# Patient Record
Sex: Female | Born: 1965 | Race: White | Hispanic: No | Smoking: Current every day smoker
Health system: Southern US, Community
[De-identification: ages and names within clinical notes are randomized; demographics above are authoritative.]

## PROBLEM LIST (undated history)

## (undated) DIAGNOSIS — D649 Anemia, unspecified: Secondary | ICD-10-CM

## (undated) DIAGNOSIS — G40909 Epilepsy, unspecified, not intractable, without status epilepticus: Secondary | ICD-10-CM

## (undated) DIAGNOSIS — Z789 Other specified health status: Secondary | ICD-10-CM

## (undated) DIAGNOSIS — M47819 Spondylosis without myelopathy or radiculopathy, site unspecified: Secondary | ICD-10-CM

## (undated) DIAGNOSIS — K8050 Calculus of bile duct without cholangitis or cholecystitis without obstruction: Secondary | ICD-10-CM

## (undated) DIAGNOSIS — E785 Hyperlipidemia, unspecified: Secondary | ICD-10-CM

## (undated) DIAGNOSIS — T7840XA Allergy, unspecified, initial encounter: Secondary | ICD-10-CM

## (undated) DIAGNOSIS — M1990 Unspecified osteoarthritis, unspecified site: Secondary | ICD-10-CM

## (undated) DIAGNOSIS — R002 Palpitations: Secondary | ICD-10-CM

## (undated) DIAGNOSIS — M5441 Lumbago with sciatica, right side: Secondary | ICD-10-CM

## (undated) DIAGNOSIS — R011 Cardiac murmur, unspecified: Secondary | ICD-10-CM

## (undated) DIAGNOSIS — F329 Major depressive disorder, single episode, unspecified: Secondary | ICD-10-CM

## (undated) DIAGNOSIS — F32A Depression, unspecified: Secondary | ICD-10-CM

## (undated) DIAGNOSIS — Z87442 Personal history of urinary calculi: Secondary | ICD-10-CM

## (undated) DIAGNOSIS — G4700 Insomnia, unspecified: Secondary | ICD-10-CM

## (undated) DIAGNOSIS — Z720 Tobacco use: Secondary | ICD-10-CM

## (undated) DIAGNOSIS — I2510 Atherosclerotic heart disease of native coronary artery without angina pectoris: Secondary | ICD-10-CM

## (undated) DIAGNOSIS — F419 Anxiety disorder, unspecified: Secondary | ICD-10-CM

## (undated) DIAGNOSIS — I471 Supraventricular tachycardia, unspecified: Secondary | ICD-10-CM

## (undated) DIAGNOSIS — Z8489 Family history of other specified conditions: Secondary | ICD-10-CM

## (undated) DIAGNOSIS — Z87891 Personal history of nicotine dependence: Secondary | ICD-10-CM

## (undated) HISTORY — PX: FOOT SURGERY: SHX648

## (undated) HISTORY — DX: Hyperlipidemia, unspecified: E78.5

## (undated) HISTORY — DX: Anxiety disorder, unspecified: F41.9

## (undated) HISTORY — PX: DILATION AND CURETTAGE OF UTERUS: SHX78

## (undated) HISTORY — PX: CATARACT EXTRACTION, BILATERAL: SHX1313

## (undated) HISTORY — PX: HAND SURGERY: SHX662

## (undated) HISTORY — PX: OTHER SURGICAL HISTORY: SHX169

## (undated) HISTORY — DX: Anemia, unspecified: D64.9

## (undated) HISTORY — DX: Lumbago with sciatica, right side: M54.41

## (undated) HISTORY — DX: Atherosclerotic heart disease of native coronary artery without angina pectoris: I25.10

## (undated) HISTORY — DX: Tobacco use: Z72.0

## (undated) HISTORY — PX: SPINAL FUSION: SHX223

## (undated) HISTORY — DX: Allergy, unspecified, initial encounter: T78.40XA

## (undated) HISTORY — PX: TONSILLECTOMY: SUR1361

## (undated) HISTORY — PX: CYST EXCISION: SHX5701

## (undated) HISTORY — PX: LUMBAR FUSION: SHX111

## (undated) HISTORY — DX: Major depressive disorder, single episode, unspecified: F32.9

## (undated) HISTORY — DX: Depression, unspecified: F32.A

## (undated) HISTORY — DX: Unspecified osteoarthritis, unspecified site: M19.90

---

## 2004-03-04 ENCOUNTER — Ambulatory Visit (HOSPITAL_COMMUNITY): Admission: RE | Admit: 2004-03-04 | Discharge: 2004-03-04 | Payer: Self-pay | Admitting: Obstetrics & Gynecology

## 2005-07-06 ENCOUNTER — Ambulatory Visit (HOSPITAL_COMMUNITY): Admission: RE | Admit: 2005-07-06 | Discharge: 2005-07-06 | Payer: Self-pay | Admitting: Podiatry

## 2005-11-15 ENCOUNTER — Ambulatory Visit (HOSPITAL_COMMUNITY): Admission: RE | Admit: 2005-11-15 | Discharge: 2005-11-15 | Payer: Self-pay | Admitting: General Surgery

## 2005-11-29 ENCOUNTER — Ambulatory Visit: Payer: Self-pay | Admitting: Orthopedic Surgery

## 2005-12-14 ENCOUNTER — Ambulatory Visit (HOSPITAL_COMMUNITY): Admission: RE | Admit: 2005-12-14 | Discharge: 2005-12-14 | Payer: Self-pay | Admitting: General Surgery

## 2005-12-16 ENCOUNTER — Ambulatory Visit (HOSPITAL_COMMUNITY): Admission: RE | Admit: 2005-12-16 | Discharge: 2005-12-16 | Payer: Self-pay | Admitting: Orthopedic Surgery

## 2005-12-26 ENCOUNTER — Ambulatory Visit: Payer: Self-pay | Admitting: Orthopedic Surgery

## 2005-12-30 ENCOUNTER — Ambulatory Visit: Payer: Self-pay | Admitting: Orthopedic Surgery

## 2005-12-30 ENCOUNTER — Encounter (INDEPENDENT_AMBULATORY_CARE_PROVIDER_SITE_OTHER): Payer: Self-pay | Admitting: Specialist

## 2005-12-30 ENCOUNTER — Ambulatory Visit (HOSPITAL_COMMUNITY): Admission: RE | Admit: 2005-12-30 | Discharge: 2005-12-30 | Payer: Self-pay | Admitting: Orthopedic Surgery

## 2006-01-02 ENCOUNTER — Ambulatory Visit: Payer: Self-pay | Admitting: Orthopedic Surgery

## 2006-01-11 ENCOUNTER — Ambulatory Visit: Payer: Self-pay | Admitting: Orthopedic Surgery

## 2006-08-28 ENCOUNTER — Ambulatory Visit (HOSPITAL_COMMUNITY): Admission: RE | Admit: 2006-08-28 | Discharge: 2006-08-28 | Payer: Self-pay | Admitting: Family Medicine

## 2007-02-21 ENCOUNTER — Ambulatory Visit (HOSPITAL_COMMUNITY): Admission: RE | Admit: 2007-02-21 | Discharge: 2007-02-21 | Payer: Self-pay | Admitting: Urology

## 2007-06-18 ENCOUNTER — Ambulatory Visit (HOSPITAL_COMMUNITY): Admission: RE | Admit: 2007-06-18 | Discharge: 2007-06-18 | Payer: Self-pay | Admitting: Family Medicine

## 2007-09-20 ENCOUNTER — Ambulatory Visit (HOSPITAL_COMMUNITY): Admission: RE | Admit: 2007-09-20 | Discharge: 2007-09-20 | Payer: Self-pay | Admitting: Family Medicine

## 2007-09-25 ENCOUNTER — Encounter (HOSPITAL_COMMUNITY): Admission: RE | Admit: 2007-09-25 | Discharge: 2007-10-25 | Payer: Self-pay | Admitting: Family Medicine

## 2007-10-04 ENCOUNTER — Ambulatory Visit: Payer: Self-pay | Admitting: Gastroenterology

## 2008-10-22 ENCOUNTER — Ambulatory Visit (HOSPITAL_COMMUNITY): Admission: RE | Admit: 2008-10-22 | Discharge: 2008-10-22 | Payer: Self-pay | Admitting: Family Medicine

## 2009-01-15 ENCOUNTER — Other Ambulatory Visit: Admission: RE | Admit: 2009-01-15 | Discharge: 2009-01-15 | Payer: Self-pay | Admitting: Obstetrics & Gynecology

## 2010-01-26 ENCOUNTER — Encounter: Admission: RE | Admit: 2010-01-26 | Discharge: 2010-01-26 | Payer: Self-pay | Admitting: Internal Medicine

## 2010-03-29 ENCOUNTER — Other Ambulatory Visit: Admission: RE | Admit: 2010-03-29 | Discharge: 2010-03-29 | Payer: Self-pay | Admitting: Obstetrics & Gynecology

## 2010-05-29 ENCOUNTER — Encounter: Payer: Self-pay | Admitting: General Surgery

## 2010-09-21 NOTE — Assessment & Plan Note (Signed)
NAMEMarland [name]  RANESSA, KOSTA                CHART#:  [id number]   DATE:  10/04/2007                       DOB:  February 03, 1966   REFERRING PHYSICIAN:  Corrie Mckusick, M.D.   REASON FOR CONSULTATION:  Anemia, colonoscopy.   HISTORY OF PRESENT ILLNESS:  Ms. Anna [name] is a 45 year old female who  stated her coworkers noticed she was yellow in her face, her hands, and  her ankles.  She was in [place] talking to her mother and found that  she was severely anemic.  She was concerned about her health and went to  see Dr. Phillips Odor, who ordered labs.  He found that she had a hemoglobin  of 11.9 with an MCV of 90.  She rarely has diarrhea.  She occasionally  has constipation.  She denies any rectal bleeding, black stool, problems  swallowing, or problems with sedation.  She has heartburn and  indigestion three times a month.  She has not been taking her fish oil  for the last month.  For her heartburn and indigestion, she takes  Rolaids.  No particular foods precipitate her heartburn and indigestion,  sometimes it can be water.  She had no change in her bowel habits or  abdominal pain.  Years ago, she had some craving of ice.   PAST MEDICAL HISTORY:  1. Allergies.  2. She has had no history of diverticulitis.   PAST SURGICAL HISTORY:  1. Right hand surgery.  2. Left foot bunionectomy.  3. Tubal ligation.  4. Uterine ablation, resulting in amenorrhea.   ALLERGIES:  Penicillin.   MEDICATIONS:  1. Advil as needed.  2. Aleve as needed.  3. Iron   FAMILY HISTORY:  Her mother has a tortuous colon and cannot have  colonoscopy.  She currently has anemia.  Her father recently died of T  cell non-Hodgkin's lymphoma.  She has no known family history of colon  cancer, colon polyps, or celiac sprue.   SOCIAL HISTORY:  She has two children, 16 and 39, and she is married.  She works at [employer].  She quit smoking in September, 2007.  She denies any IV or recreational drug use.  She has had no  blood  transfusions.   PHYSICAL EXAMINATION:  Weight 158 pounds.  Height 5 foot 5 inches.  BMI  28 (overweight).  Temperature 98.1, blood pressure 116/80, pulse 88.  GENERAL:  She is in no apparent distress.  Alert and oriented x4.  HEENT:  Normocephalic and atraumatic.  Pupils are equal, round and  reactive to light.  Mouth:  No oral lesions.  Posterior pharynx without  erythema or exudate.  NECK:  Full range of motion.  No lymphadenopathy.  LUNGS:  Clear to auscultation bilaterally.  CARDIOVASCULAR:  Regular  rhythm.  No murmur.  Normal S1 and S2.  ABDOMEN:  Bowel sounds are  present, soft, nontender, nondistended.  No rebound or guarding.  EXTREMITIES:  No cyanosis or edema. NEURO:  She has no focal neurologic  deficits. RECTAL:  Heme negative.  No masses palpated.  Normal tone.   LABS FROM MAY, 2009:  Hemoglobin 11.1, MCV 90, platelets 286.  BUN 16,  creatinine 0.67, AST 13, ALT 14, albumin 4.8, TSH 1.673.  TIBC 314 (250-  470).  B12 317, folate 16.9, ferritin 69 (10-291).   ASSESSMENT:  Ms. Anna [name]  is a 45 year old female with a very mild  normocytic anemia.  It is conceivable that she could have early iron-  deficiency anemia.  Her parameters are not consistent with FeDA  currently while on iron therapy.  Thank you for allowing me to see Ms.  Gauldin in consultation.  My recommendations follow.   RECOMMENDATIONS:  1. I offered Ms. Gauldin a colonoscopy, but she said due to insurance      reasons, she would like to hold off.  2. Will reschedule her in three months and check a hemoglobin and      hematocrit.  I have asked her not to use iron supplementation, so      that if she truly does manifest evidence of iron-deficiency anemia,      then I would strongly encourage her to have the colonoscopy at that      time.  Currently, she has no evidence of active GI bleeding, heme-      positive stools, or other concerning symptoms.       Kassie Mends, M.D.  Electronically  Signed     SM/MEDQ  D:  10/04/2007  T:  10/04/2007  Job:  [id number]   cc:   Corrie Mckusick, M.D.

## 2010-09-24 NOTE — Op Note (Signed)
NAME:  Anna [name], Anna [name]               ACCOUNT NO.:  [id number]   MEDICAL RECORD NO.:  [id number]          PATIENT TYPE:  AMB   LOCATION:  DAY                           FACILITY:  [facility]   PHYSICIAN:  Vickki Hearing, M.D.DATE OF BIRTH:  14-Dec-1965   DATE OF PROCEDURE:  12/30/2005  DATE OF DISCHARGE:                                 OPERATIVE REPORT   PREOPERATIVE DIAGNOSIS:  Mass right hand.   POSTOPERATIVE DIAGNOSIS:  Mass right hand/ganglion cyst of tendon sheath  right index finger.   PROCEDURE:  Excision of mass right hand.   SURGEON:  Dr. Fuller Canada.   ASSISTANT:  No assistance.   ANESTHETIC:  Bier block.   SPECIMENS:  One ganglion cyst.   DISPOSITION OF PATH:  Specimen to pathology for microscopic identification.   ESTIMATED BLOOD LOSS:  Minimal.   COMPLICATIONS:  None.  The patient went to recovery in stable condition.   DESCRIPTION OF PROCEDURE:  The primary indication of the procedure was pain.  The patient identified in the preop holding area.  She marked the right hand  as the operative site, I circled the mass and marked my initials there. She  was given a preoperative dose of Ancef.  Her history and physical were  updated and she was taken to the operating room for a Bier block.  After  that was successfully established without any problem, the right upper  extremity was prepped and draped using a DuraPrep solution. That was allowed  to dry and a time-out procedure was completed. The procedure confirmed as  excision of mass right index finger on Anna [name].   An oblique incision was made across the mass, subcutaneous tissue was  divided, mass was immediately encountered as an encapsulated structure.  Blunt dissection was carried down to the tendon sheath. The digital nerve  was protected after identification. The mass was removed.  It came directly  from the tendon sheath and was removed as one intact specimen and sent to  pathology. The wound was  irrigated and closed with 2-0 Monocryl and 3-0  nylon. I injected 10 mL of plain 0.25% Marcaine and applied a sterile  dressing. The tourniquet was eventually released.  Fingers were in good condition with good capillary refill.  The patient was  taken to PACU as stated in good condition.  Follow-up will be Monday.  She  is discharged on Vicodin #40 with one refill, take one 5 mg tablet q.4h.  p.r.n. for pain. Instructions are to keep the hand elevated, keep the  dressing clean and dry.      Vickki Hearing, M.D.  Electronically Signed     SEH/MEDQ  D:  12/30/2005  T:  12/30/2005  Job:  [id number]

## 2010-09-24 NOTE — Op Note (Signed)
Anna [name], Anna [name]               ACCOUNT NO.:  [id number]   MEDICAL RECORD NO.:  [id number]          PATIENT TYPE:  AMB   LOCATION:  DAY                           FACILITY:  [facility]   PHYSICIAN:  Lazaro Arms, M.D.   DATE OF BIRTH:  [birth year]   DATE OF PROCEDURE:  03/04/2004  DATE OF DISCHARGE:                                 OPERATIVE REPORT   PREOPERATIVE DIAGNOSES:  1.  Menometrorrhagia.  2.  Dysmenorrhea.  3.  Desires sterilization.   POSTOPERATIVE DIAGNOSES:  1.  Menometrorrhagia.  2.  Dysmenorrhea.  3.  Desires sterilization.   PROCEDURE:  1.  Laparoscopic bilateral tubal fulguration.  2.  Hysteroscopy, dilatation and curettage, and endometrial ablation.   SURGEON:  Lazaro Arms, M.D.   FINDINGS:  The patient had a normal endometrial cavity.  She had a couple of  small, what appeared to be polyps. They appeared to be benign.  Her  peritoneal cavity was also normal.  Uterus, tubes, and ovaries were normal.   DESCRIPTION OF OPERATION:  The patient was taken to the operating room and  placed in the supine position where she underwent general endotracheal  anesthesia.  She was prepped and draped in the usual sterile fashion for  both abdominal and vaginal surgery.  An incision was made in the umbilicus  and a Veress needle placed in the peritoneal cavity without difficulty.  A  nonbladed trocar was used and with direct visualization the peritoneal  cavity was entered without difficulty.  The above-noted findings were seen.   The peritoneal cavity continued to be insufflated during the procedure.  A  bilateral tubal ligation was performed using the electrocautery unit.  A 2.5  cm segment was burned, no resistance beyond the distal isthmic ampullary  region of each tube.  The instruments were removed.  The gas was allowed to  escape.  The fascia was closed.  The skin was closed with 3-0 Vicryl  subcutaneously and also with Dermabond.   Attention was then to the  hysteroscopy, D&C, endometrial ablation.  The  speculum was placed, cervix was grasped.  The cervix dilated to 25 [place]  and the hydrothermal ablator hysteroscope was placed.  The endometrial  cavity was visualized and a couple of small polyps were seen.  Otherwise  normal vigorous curettage was performed.  Tissue was sent to pathology.  The  hydrothermal ablator was then placed and the endometrial ablation was  performed.  There was a total of 6 cc of fluid.  It was unaccounted at the  end of the procedure which is well within the appropriate limits.  There was  no decrease in pressure consistent with a perforation so the procedure went  well  without any difficulty.  There was a 3 minute heat phase and a 10 minute  treatment phase and the hysteroscope was removed.  The patient tolerated the  procedure well.  She experienced minimal blood loss and was taken to the  recovery room in good stable condition.  All counts were correct. Specimens  went to the lab.  Luth   LHE/MEDQ  D:  03/04/2004  T:  03/04/2004  Job:  [id number]

## 2010-09-24 NOTE — H&P (Signed)
NAMECHRISTIONNA, [name]               ACCOUNT NO.:  [id number]   MEDICAL RECORD NO.:  [id number]          PATIENT TYPE:  AMB   LOCATION:  DAY                           FACILITY:  [facility]   PHYSICIAN:  Cody M. Ulice Brilliant, D.P.M.  DATE OF BIRTH:  [birth year]   DATE OF ADMISSION:  07/06/2005  DATE OF DISCHARGE:  LH                                HISTORY & PHYSICAL   HISTORY OF PRESENT ILLNESS:  Mrs. Anna [name] has a painful bunion deformity of  the first metatarsal phalangeal joint of the left foot.  This has been  present approximately 16-18 months getting progressively worse.  She relates  that at this point, all shoes that are enclosed are uncomfortable.  She has  tried increasing the width of her shoes and tried different styles of shoes,  all of which at this point has made very little difference.   PAST MEDICAL HISTORY:  Unremarkable.   MEDICATIONS:  Paxil for anxiety/depression.   ALLERGIES:  PENICILLIN.   SOCIAL HISTORY:  Smokes one half pack of cigarettes per day.  She denies the  use of alcohol.  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   PHYSICAL EXAMINATION:  GENERAL APPEARANCE:  She has a pronounced HAV  deformity of the left foot with painful range of motion as well as  discomfort to deep palpation around the medial limits.  Radiographs reveal a  relatively long first metatarsal with an increased in the IM angle and the  first metatarsal is noted to be slightly elevated on the weightbearing  lateral view.   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   PLAN:  Surgical correction will consist of an osteobunionectomy.  This will  be basically a tripositional osteotomy.  We will attempt to shorten and  somewhat plantar flex the first metatarsal with the cut as well as reducing  the intermetatarsal angle.  We have discussed this with the patient.  She  has read her consent form.  She has been made aware of the procedure, the  usual postoperative course  and the possibility of complications.  She, as  stated, has read the consent form and apparently understood and signed.                                            ______________________________  Denny Peon. Ulice Brilliant, D.P.M.     CMD/MEDQ  D:  07/04/2005  T:  07/04/2005  Job:  [id number]

## 2010-09-24 NOTE — Op Note (Signed)
Anna [name], [name]               ACCOUNT NO.:  [id number]   MEDICAL RECORD NO.:  [id number]          PATIENT TYPE:  AMB   LOCATION:  DAY                           FACILITY:  [facility]   PHYSICIAN:  Cody M. Ulice Brilliant, D.P.M.  DATE OF BIRTH:  May 30, 1965   DATE OF PROCEDURE:  07/06/2005  DATE OF DISCHARGE:  07/06/2005                                 OPERATIVE REPORT   PREOPERATIVE DIAGNOSIS:  Hallux abductovalgus deformity of left foot.   POSTOPERATIVE DIAGNOSIS:  Hallux abductovalgus deformity of left foot.   PROCEDURE PERFORMED:  Austin bunionectomy, left foot.   SURGEON:  Denny Peon. Ulice Brilliant, D.P.M.   ANESTHESIA:  Monitored anesthesia care.   INDICATIONS FOR SURGERY:  Painful long-standing bunion deformity of the  first MTP. The patient clinically is noted to have a slightly reduced range  of motion in the first MTP and painful to palpation medially and somewhat  dorsally. The patient is noted to have a relatively long first metatarsal  along with an increase in the IM angle resulting in uncomfortable ambulation  in all shoes. The patient has requested surgical correction.   DESCRIPTION OF PROCEDURE:  The patient is brought into the OR and placed on  the table in supine position. IV sedation is established. A Mayo block was  then performed about the first MTP of the left foot. A pneumatic ankle  tourniquet was then applied across her left ankle. Her foot is prepped and  draped in the usual aseptic fashion. An Ace bandage was utilized to examine  her foot. Tourniquet was inflated to 250 mmHg.   PROCEDURE:  AUSTIN BUNIONECTOMY, LEFT FOOT. Attention is directed to the  first MTP of the left foot. A 7-cm curvilinear skin incision was made. The  incision was deepened by sharp and blunt dissection through subcutaneous  tissues to the deep fascia. A dorsal linear capsulotomy was then performed.  The capsule and periosteal tissues were reflected away from the medial  aspect of the first metatarsal  head. The medial eminence is delivered into  the wound and excised utilizing the oscillating saw with a #63 blade.  Attention is then directed into the interspace through the original skin  incision. A Weitlaner self-retaining retractor was utilized to aid in  exposure. Metzenbaum scissors were utilized to carry the dissection down to  the fibular sesamoid, and abductor hallices tenotomy is then performed.  Attention is then directed back to the first metatarsal head. A 0.045 K wire  was introduced and utilized to act as a pin access guide. What we are trying  to achieve with the osteotomy is to cut down the IM angle and to slightly  shorten and slightly plantar flex the first metatarsal. With the K wire in  place, the osteotomy cuts were then performed with a 63 blade on the  oscillating saw. The capital fragment is relocated approximately 40% across  the width of the first metatarsal surface. This is then fixated with two  0.045 K wires driven in a divergent fashion with care taken to knot it to  not have redundant protrusion of the  K wire through the articular surface.  The toe was put through a range of motion and found to be freely movable.  The osteotomy was deemed stable. This was also visualized utilizing the  XiScan. The redundant bone medially was excised, and the newly formed  osseous surface is rasped smooth. K wires were bent close to the metatarsal  surface, cut and then rotated so that they lay flush. The wound was then  again flushed with copious amounts of irrigant. Capsular tissues were  reapproximated and closed with 3-0 Vicryl. Subcutaneous tissues were  reapproximated and closed with 4-0 Vicryl in a running horizontal mattress  suture. Skin was closed with 4-0 Vicryl in a running subcuticular suture.  Sterile strips were applied across the incision. Postoperative injection of  Marcaine and Hexadrol was dispensed. A Betadine-soaked Adaptic dressing and  dry sterile  __________ dressing follows. Tourniquet was deflated with  tourniquet time spanning 45 minutes.   Anna [name] tolerated the anesthesia and procedure well. A size medium cam  walker is dispensed as well as a Darco surgical shoe. Postoperative  prescription for Lorcet Plus as well as Phenergan is dispensed. Written  postoperative instructions are all explained to her. She will be seen within  seven days for first postoperative visit.           ______________________________  Denny Peon. Ulice Brilliant, D.P.M.     CMD/MEDQ  D:  07/07/2005  T:  07/08/2005  Job:  [id number]

## 2010-09-24 NOTE — H&P (Signed)
NAME:  Anna [name], Anna [name]               ACCOUNT NO.:  [id number]   MEDICAL RECORD NO.:  [id number]          PATIENT TYPE:  AMB   LOCATION:  DAY                           FACILITY:  [facility]   PHYSICIAN:  Vickki Hearing, M.D.DATE OF BIRTH:  07-29-1965   DATE OF ADMISSION:  DATE OF DISCHARGE:  LH                                HISTORY & PHYSICAL   CHIEF COMPLAINT:  Mass palmar aspect right-hand.   HISTORY:  This is a 45 year old female with a painful mass on the palmar  surface of her right hand overlying the index finger, which has been causing  her pain for the last 2 months.  She had an MRI which shows it was a benign  synovial cyst and she is presenting for removal of that mass.   Meshelle gives a history of joint swelling and anxiety.  She denied weight  loss, chest pain, shortness of breath, nausea, hematuria, headache, goiter,  eczema, vertigo or sinusitis.   ALLERGIES:  NO KNOWN DRUG ALLERGIES.   MEDICATIONS:  She takes Paxil.   SURGERIES:  1. She had tonsillectomy.  2. Correction of hallux valgus.   FAMILY HISTORY:  Heart disease and arthritis.   SOCIAL HISTORY:  She is married.  Previously her family physician was Dr.  Elpidio Anis.  She is employed.  Smokes half pack of cigarettes a day.  Does  not drink.  High school education, grade 12.   PHYSICAL EXAMINATION:  VITAL SIGNS:  Weight 140, pulse 68, respiratory rate  18.  GENERAL APPEARANCE:  Normal.  HEENT:  Head and face with no evidence of trauma.  Eyes, ears, nose and  throat with no gross abnormalities.  NECK:  Supple.  RESPIRATORY:  Chest clear.  CARDIOVASCULAR:  Peripheral pulses were normal.  No swelling or edema.  ABDOMEN:  Soft.  No mass.  MUSCULOSKELETAL:  The index finger of the right hand had full range of  motion.  There was tenderness over the A1 pulley and there was a mass which  appeared to be in the subcu tissue.  The joint itself was stable.  Grip  strength was normal.  SKIN:  As stated.  NEUROLOGIC:  Sensation was normal.  She was oriented x3.  Mood was pleasant.   IMPRESSION:  MRI indicates benign appearing cystic lesion overlying the  flexor tendon of the index finger at the level of the MP joint, most likely  ganglion or synovial cyst dated December 16, 2005, at [facility].   PLAN:  Excision of the mass over the right hand of the index finger.      Vickki Hearing, M.D.  Electronically Signed     SEH/MEDQ  D:  12/29/2005  T:  12/29/2005  Job:  [id number]   cc:   [name]

## 2011-01-21 ENCOUNTER — Ambulatory Visit (HOSPITAL_COMMUNITY)
Admission: RE | Admit: 2011-01-21 | Discharge: 2011-01-21 | Disposition: A | Discharge status: Home / Self Care | Payer: BC Managed Care – PPO | Source: Ambulatory Visit | Attending: Family Medicine | Admitting: Family Medicine

## 2011-01-21 ENCOUNTER — Other Ambulatory Visit (HOSPITAL_COMMUNITY): Payer: Self-pay | Admitting: Family Medicine

## 2011-01-21 DIAGNOSIS — F329 Major depressive disorder, single episode, unspecified: Secondary | ICD-10-CM

## 2011-01-21 DIAGNOSIS — F3289 Other specified depressive episodes: Secondary | ICD-10-CM

## 2011-01-21 DIAGNOSIS — Z01419 Encounter for gynecological examination (general) (routine) without abnormal findings: Secondary | ICD-10-CM

## 2011-01-21 DIAGNOSIS — F17200 Nicotine dependence, unspecified, uncomplicated: Secondary | ICD-10-CM

## 2011-01-21 DIAGNOSIS — Z0000 Encounter for general adult medical examination without abnormal findings: Secondary | ICD-10-CM

## 2011-01-21 DIAGNOSIS — R05 Cough: Secondary | ICD-10-CM

## 2011-01-21 DIAGNOSIS — R059 Cough, unspecified: Secondary | ICD-10-CM

## 2011-04-12 ENCOUNTER — Other Ambulatory Visit: Payer: Self-pay | Admitting: Obstetrics & Gynecology

## 2011-04-12 ENCOUNTER — Other Ambulatory Visit (HOSPITAL_COMMUNITY)
Admission: RE | Admit: 2011-04-12 | Discharge: 2011-04-12 | Disposition: A | Discharge status: Home / Self Care | Payer: BC Managed Care – PPO | Source: Ambulatory Visit | Attending: Obstetrics & Gynecology | Admitting: Obstetrics & Gynecology

## 2011-04-12 DIAGNOSIS — Z01419 Encounter for gynecological examination (general) (routine) without abnormal findings: Secondary | ICD-10-CM | POA: Insufficient documentation

## 2011-04-12 DIAGNOSIS — Z139 Encounter for screening, unspecified: Secondary | ICD-10-CM

## 2011-04-14 ENCOUNTER — Ambulatory Visit (HOSPITAL_COMMUNITY): Payer: BC Managed Care – PPO

## 2011-07-25 ENCOUNTER — Ambulatory Visit (HOSPITAL_COMMUNITY)
Admission: RE | Admit: 2011-07-25 | Discharge: 2011-07-25 | Disposition: A | Discharge status: Home / Self Care | Payer: 59 | Source: Ambulatory Visit | Attending: Obstetrics & Gynecology | Admitting: Obstetrics & Gynecology

## 2011-07-25 DIAGNOSIS — Z139 Encounter for screening, unspecified: Secondary | ICD-10-CM

## 2011-07-25 DIAGNOSIS — Z1231 Encounter for screening mammogram for malignant neoplasm of breast: Secondary | ICD-10-CM | POA: Insufficient documentation

## 2011-07-25 DIAGNOSIS — N63 Unspecified lump in unspecified breast: Secondary | ICD-10-CM | POA: Insufficient documentation

## 2011-07-25 DIAGNOSIS — R928 Other abnormal and inconclusive findings on diagnostic imaging of breast: Secondary | ICD-10-CM | POA: Insufficient documentation

## 2011-07-27 ENCOUNTER — Other Ambulatory Visit: Payer: Self-pay | Admitting: Obstetrics & Gynecology

## 2011-07-27 DIAGNOSIS — R928 Other abnormal and inconclusive findings on diagnostic imaging of breast: Secondary | ICD-10-CM

## 2011-08-15 ENCOUNTER — Ambulatory Visit
Admission: RE | Admit: 2011-08-15 | Discharge: 2011-08-15 | Disposition: A | Discharge status: Home / Self Care | Payer: 59 | Source: Ambulatory Visit | Attending: Obstetrics & Gynecology | Admitting: Obstetrics & Gynecology

## 2011-08-15 ENCOUNTER — Ambulatory Visit: Payer: 59

## 2011-08-15 DIAGNOSIS — R928 Other abnormal and inconclusive findings on diagnostic imaging of breast: Secondary | ICD-10-CM

## 2011-12-20 ENCOUNTER — Other Ambulatory Visit (HOSPITAL_COMMUNITY): Payer: Self-pay | Admitting: Family Medicine

## 2011-12-20 ENCOUNTER — Ambulatory Visit (HOSPITAL_COMMUNITY)
Admission: RE | Admit: 2011-12-20 | Discharge: 2011-12-20 | Disposition: A | Discharge status: Home / Self Care | Payer: 59 | Source: Ambulatory Visit | Attending: Family Medicine | Admitting: Family Medicine

## 2011-12-20 DIAGNOSIS — R634 Abnormal weight loss: Secondary | ICD-10-CM | POA: Insufficient documentation

## 2011-12-29 ENCOUNTER — Encounter (HOSPITAL_COMMUNITY): Payer: Self-pay

## 2011-12-29 NOTE — H&P (Signed)
  NTS SOAP Note  Vital Signs:  Vitals as of: 12/29/2011: Systolic 130: Diastolic 87: Heart Rate 75: Temp 99.80F: Height 99ft 3.5in: Weight 112Lbs 0 Ounces: BMI 20  BMI : 19.53 kg/m2  Subjective: This 46 Years 41 Months old Female presents for weight of unknown etiology.  No diarrhea, constipation, family h/o colon carcinoma, blood in stools, abdominal pain.  Never has had a TCS.   Review of Symptoms:  Constitutional:  weight loss Head:unremarkable    Eyes:unremarkable   Nose/Mouth/Throat:unremarkable Cardiovascular:  unremarkable   Respiratory:unremarkable   Gastrointestinal:  unremarkable   Genitourinary:unremarkable     Musculoskeletal:unremarkable   Skin:unremarkable Hematolgic/Lymphatic:unremarkable     Allergic/Immunologic:unremarkable     Past Medical History:    Reviewed   Past Medical History  Surgical History: unremarkable Medical Problems: unremarkable Allergies: PCN, wellbutrin Medications: unknown   Social History:Reviewed  Social History  Preferred Language: English ([place]) Race:  White Ethnicity: Not Hispanic / Latino Age: 46 Years 5 Months Marital Status:  S Alcohol:  No Recreational drug(s):  No   Smoking Status: Never smoker reviewed on 12/29/2011  Family History:  Reviewed   Family History  Is there a family history of:no family h/o colon cancer    Objective Information: General:unremarkable   Head:Atraumatic; no masses; no abnormalities Heart:  RRR, no murmur Lungs:    CTA bilaterally, no wheezes, rhonchi, rales.  Breathing unlabored. Abdomen:Soft, NT/ND, no HSM, no masses.   deferred to procedure  Assessment:Weight loss of unknown etiology   Diagnosis &amp; Procedure: DiagnosisCode: 783.21, ProcedureCode: 21308,    Plan:Scheduled for TCS on 01/09/12.   Patient Education:Alternative treatments to surgery were discussed with patient (and family).  Risks  and benefits  of procedure were fully explained to the patient (and family) who gave informed consent. Patient/family questions were addressed.  Follow-up:Pending Surgery

## 2012-01-06 MED ORDER — SODIUM CHLORIDE 0.9 % IV SOLN: INTRAVENOUS | Status: DC

## 2012-01-10 ENCOUNTER — Encounter (HOSPITAL_COMMUNITY): Payer: Self-pay | Admitting: *Deleted

## 2012-01-10 ENCOUNTER — Encounter (HOSPITAL_COMMUNITY)
Admission: RE | Disposition: A | Discharge status: Home / Self Care | Payer: Self-pay | Source: Ambulatory Visit | Attending: General Surgery

## 2012-01-10 ENCOUNTER — Ambulatory Visit (HOSPITAL_COMMUNITY)
Admission: RE | Admit: 2012-01-10 | Discharge: 2012-01-10 | Disposition: A | Discharge status: Home / Self Care | Payer: 59 | Source: Ambulatory Visit | Attending: General Surgery | Admitting: General Surgery

## 2012-01-10 DIAGNOSIS — R634 Abnormal weight loss: Secondary | ICD-10-CM | POA: Insufficient documentation

## 2012-01-10 HISTORY — DX: Other specified health status: Z78.9

## 2012-01-10 HISTORY — PX: COLONOSCOPY: SHX5424

## 2012-01-10 SURGERY — COLONOSCOPY
Anesthesia: Moderate Sedation

## 2012-01-10 MED ORDER — SODIUM CHLORIDE 0.45 % IV SOLN
INTRAVENOUS | Status: DC
  Administered 2012-01-10: 08:00:00 via INTRAVENOUS

## 2012-01-10 MED ORDER — MIDAZOLAM HCL 5 MG/5ML IJ SOLN
INTRAMUSCULAR | Status: AC
  Filled 2012-01-10: qty 10

## 2012-01-10 MED ORDER — MEPERIDINE HCL 25 MG/ML IJ SOLN
INTRAMUSCULAR | Status: DC | PRN
  Administered 2012-01-10: 50 mg via INTRAVENOUS

## 2012-01-10 MED ORDER — STERILE WATER FOR IRRIGATION IR SOLN
Status: DC | PRN
  Administered 2012-01-10: 09:00:00

## 2012-01-10 MED ORDER — MEPERIDINE HCL 100 MG/ML IJ SOLN
INTRAMUSCULAR | Status: AC
  Filled 2012-01-10: qty 1

## 2012-01-10 MED ORDER — MIDAZOLAM HCL 5 MG/5ML IJ SOLN
INTRAMUSCULAR | Status: DC | PRN
  Administered 2012-01-10: 2 mg via INTRAVENOUS
  Administered 2012-01-10: 1 mg via INTRAVENOUS

## 2012-01-10 NOTE — Op Note (Signed)
[facility] [address] [place], [address]   COLONOSCOPY PROCEDURE REPORT  PATIENT: Anna [name], Anna [name]  MR#: [id number] BIRTHDATE: January 04, 1966 , 46  yrs. old GENDER: Female ENDOSCOPIST: Franky Macho, MD REFERRED XB:[id number], John PROCEDURE DATE:  01/10/2012 PROCEDURE:   Colonoscopy, diagnostic ASA CLASS:   Class II INDICATIONS:weight loss. MEDICATIONS: Versed 4 mg IV and Demerol 50 mg IV  DESCRIPTION OF PROCEDURE:   After the risks benefits and alternatives of the procedure were thoroughly explained, informed consent was obtained.  A digital rectal exam revealed no abnormalities of the rectum.   The Pentax Colonoscope [id number] endoscope was introduced through the anus and advanced to the cecum, which was identified by both the appendix and ileocecal valve. No adverse events experienced.   The quality of the prep was adequate, using MoviPrep  The instrument was then slowly withdrawn as the colon was fully examined.      COLON FINDINGS: A normal appearing cecum, ileocecal valve, and appendiceal orifice were identified.  The ascending, hepatic flexure, transverse, splenic flexure, descending, sigmoid colon and rectum appeared unremarkable.  No polyps or cancers were seen. Retroflexion was not performed due to a narrow rectal vault. The time to cecum=10 minutes 0 seconds.  Withdrawal time=3 minutes 0 seconds.  The scope was withdrawn and the procedure completed. COMPLICATIONS: There were no complications.  ENDOSCOPIC IMPRESSION: Normal colon  RECOMMENDATIONS: 1.  Call to schedule a follow-up appointment with primary MD PRN 2.  repeat Colonscopy in 10 years.   eSigned:  Franky Macho, MD 01/10/2012 8:58 AM   cc:

## 2012-01-10 NOTE — Interval H&P Note (Signed)
History and Physical Interval Note:  01/10/2012 8:38 AM  Anna [name]  has presented today for surgery, with the diagnosis of Weight loss  The various methods of treatment have been discussed with the patient and family. After consideration of risks, benefits and other options for treatment, the patient has consented to  Procedure(s) (LRB): COLONOSCOPY (N/A) as a surgical intervention .  The patient's history has been reviewed, patient examined, no change in status, stable for surgery.  I have reviewed the patient's chart and labs.  Questions were answered to the patient's satisfaction.     Franky Macho A

## 2012-01-12 ENCOUNTER — Encounter (HOSPITAL_COMMUNITY): Payer: Self-pay | Admitting: General Surgery

## 2012-11-15 ENCOUNTER — Encounter (HOSPITAL_COMMUNITY): Payer: Self-pay | Admitting: Emergency Medicine

## 2012-11-15 ENCOUNTER — Emergency Department (HOSPITAL_COMMUNITY): Payer: 59

## 2012-11-15 ENCOUNTER — Emergency Department (HOSPITAL_COMMUNITY)
Admission: EM | Admit: 2012-11-15 | Discharge: 2012-11-15 | Disposition: A | Discharge status: Home / Self Care | Payer: 59 | Attending: Emergency Medicine | Admitting: Emergency Medicine

## 2012-11-15 DIAGNOSIS — Z880 Allergy status to penicillin: Secondary | ICD-10-CM | POA: Insufficient documentation

## 2012-11-15 DIAGNOSIS — R079 Chest pain, unspecified: Secondary | ICD-10-CM | POA: Insufficient documentation

## 2012-11-15 DIAGNOSIS — R609 Edema, unspecified: Secondary | ICD-10-CM | POA: Insufficient documentation

## 2012-11-15 DIAGNOSIS — F17200 Nicotine dependence, unspecified, uncomplicated: Secondary | ICD-10-CM | POA: Insufficient documentation

## 2012-11-15 DIAGNOSIS — Z79899 Other long term (current) drug therapy: Secondary | ICD-10-CM | POA: Insufficient documentation

## 2012-11-15 LAB — BASIC METABOLIC PANEL
CO2: 28 mEq/L (ref 19–32)
Calcium: 9.7 mg/dL (ref 8.4–10.5)
Creatinine, Ser: 0.53 mg/dL (ref 0.50–1.10)
GFR calc non Af Amer: >90 mL/min (ref >90)
Glucose, Bld: 80 mg/dL (ref 70–99)
Sodium: 137 mEq/L (ref 135–145)

## 2012-11-15 LAB — POCT I-STAT TROPONIN I: Troponin i, poc: 0 ng/mL (ref 0.00–0.08)

## 2012-11-15 LAB — PRO B NATRIURETIC PEPTIDE: Pro B Natriuretic peptide (BNP): 144.3 pg/mL — ABNORMAL HIGH (ref 0–125)

## 2012-11-15 LAB — CBC
MCH: 31.2 pg (ref 26.0–34.0)
MCHC: 34.5 g/dL (ref 30.0–36.0)
MCV: 90.2 fL (ref 78.0–100.0)
Platelets: 229 10*3/uL (ref 150–400)

## 2012-11-15 NOTE — ED Notes (Signed)
Patient transported to X-ray 

## 2012-11-15 NOTE — ED Notes (Signed)
Pt complains of bilateral leg swelling x 2 days.

## 2012-11-15 NOTE — ED Provider Notes (Signed)
History    CSN: [id number] Arrival date & time 11/15/12  1639  First MD Initiated Contact with Patient 11/15/12 1712     Chief Complaint  Patient presents with  . Leg Swelling   (Consider location/radiation/quality/duration/timing/severity/associated sxs/prior Treatment) HPI Comments: 47 year old female no significant past medical history presents to the emergency department complaining of bilateral leg swelling for the past 2 days. Patient states yesterday when she got home from work she noticed that her lower legs were swollen. She then elevated her legs throughout the night and swelling beginning subside in the morning. Today throughout the day her legs began to swell again and feel very sore. Admits to associated midsternal chest discomfort that she believes his heartburn described as someone putting pressure on her chest. She took some Maalox today with relief of her pain. Denies any history of heart failure, hypertension. She does not take any medications on a daily basis. Denies shortness of breath, fever, chills, nausea or vomiting, urinary changes. She does occur daily smoker. Her mom has a history of a heart attack at age of 4. No History of blood clots. Denies color changes in her legs. 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 Currently she is not having any chest pain.  The history is provided by the patient.   Past Medical History  Diagnosis Date  . No pertinent past medical history    Past Surgical History  Procedure Laterality Date  . Tonsillectomy    . Dilation and curettage of uterus      Ablation  . Excision of cyst of hand      rt  . Colonoscopy  01/10/2012    Procedure: COLONOSCOPY;  Surgeon: Dalia Heading, MD;  [facility];  Service: Gastroenterology;  Laterality: N/A;   No family history on file. History  Substance Use Topics  . Smoking status: Current Every Day Smoker -- 0.50 packs/day    Types: Cigarettes  . Smokeless  tobacco: Not on file  . Alcohol Use: Yes     Comment: occ   OB History   Grav Para Term Preterm Abortions TAB SAB Ect Mult Living                 Review of Systems  Constitutional: Negative for activity change and appetite change.  Respiratory: Negative for cough, chest tightness and shortness of breath.   Cardiovascular: Positive for chest pain and leg swelling. Negative for palpitations.  Gastrointestinal: Negative for nausea, vomiting and abdominal pain.  Genitourinary: Negative for frequency and difficulty urinating.  Musculoskeletal: Negative for back pain.  Skin: Negative for color change.  All other systems reviewed and are negative.    Allergies  Penicillins and Wellbutrin  Home Medications   Current Outpatient Rx  Name  Route  Sig  Dispense  Refill  . diazepam (VALIUM) 5 MG tablet   Oral   Take 5 mg by mouth every 6 (six) hours as needed. For anxiety         . glucosamine-chondroitin 500-400 MG tablet   Oral   Take 1 tablet by mouth daily.         [name] ibuprofen (ADVIL,MOTRIN) 200 MG tablet   Oral   Take 400 mg by mouth every 6 (six) hours as needed. For pain         . Multiple Vitamin (MULTIVITAMIN WITH MINERALS) TABS   Oral   Take 1 tablet by mouth daily.         [name]  simvastatin (ZOCOR) 10 MG tablet   Oral   Take 10 mg by mouth at bedtime.          BP 138/82  Pulse 79  Temp(Src) 98.2 F (36.8 C) (Oral)  Resp 18  SpO2 100% Physical Exam  Nursing note and vitals reviewed. Constitutional: She is oriented to person, place, and time. She appears well-developed and well-nourished. No distress.  HENT:  Head: Normocephalic and atraumatic.  Mouth/Throat: Oropharynx is clear and moist.  Eyes: Conjunctivae and EOM are normal. Pupils are equal, round, and reactive to light.  Neck: Normal range of motion. Neck supple. No JVD present.  Cardiovascular: Normal rate, regular rhythm, normal heart sounds and intact distal pulses.  Exam reveals no gallop and  no friction rub.   No murmur heard. Trace pitting edema LE bilateral without tenderness. Negative Homan's bilateral.  Pulmonary/Chest: Effort normal and breath sounds normal. No respiratory distress. She has no wheezes. She has no rales. She exhibits no tenderness.  Abdominal: Soft. Bowel sounds are normal. She exhibits no distension. There is no tenderness.  Musculoskeletal: Normal range of motion.  Neurological: She is alert and oriented to person, place, and time.  Skin: Skin is warm and dry. She is not diaphoretic. No erythema.  Psychiatric: She has a normal mood and affect. Her behavior is normal.    ED Course  Procedures (including critical care time) Labs Reviewed  CBC - Abnormal; Notable for the following:    HCT 35.9 (*)    All other components within normal limits  BASIC METABOLIC PANEL - Abnormal; Notable for the following:    Potassium 3.4 (*)    All other components within normal limits  PRO B NATRIURETIC PEPTIDE - Abnormal; Notable for the following:    Pro B Natriuretic peptide (BNP) 144.3 (*)    All other components within normal limits  POCT I-STAT TROPONIN I    Date: 11/15/2012  Rate: 68  Rhythm: normal sinus rhythm  QRS Axis: left  Intervals: normal  ST/T Wave abnormalities: normal  Conduction Disutrbances:none  Narrative Interpretation: LVH, no stemi  Old EKG Reviewed: none available   Dg Chest 2 View  11/15/2012   *RADIOLOGY REPORT*  Clinical Data: Leg swelling.  Smoker.  CHEST - 2 VIEW  Comparison: 12/20/2011  Findings: Heart is normal size.  Lungs are clear.  No effusions. No acute bony abnormality.  IMPRESSION: No acute findings.   Original Report Authenticated By: Charlett Nose, M.D.   1. Peripheral edema     MDM  Patient with bilateral leg edema, chest discomfort relieved by maalox. Trace pitting edema on exam, no tenderness. No erythema or signs of infection. Does not meet Well's criteria for DVT- low suspicion. EKG, labs, CXR unremarkable. Dx  peripheral edema. Will advise decreased salt intake, leg elevation, compression stockings f/u with PCP. Case discussed with Dr. Jeraldine Loots who also evaluated patient and agrees with plan of care.  Trevor Mace, PA-C 11/15/12 [phone]

## 2012-11-16 NOTE — ED Provider Notes (Signed)
  Medical screening examination/treatment/procedure(s) were performed by non-physician practitioner and as supervising physician I was immediately available for consultation/collaboration.    Natelie Ostrosky, MD 11/16/12 0020 

## 2013-07-22 ENCOUNTER — Other Ambulatory Visit (HOSPITAL_COMMUNITY): Payer: Self-pay | Admitting: Family Medicine

## 2013-07-22 ENCOUNTER — Ambulatory Visit (HOSPITAL_COMMUNITY)
Admission: RE | Admit: 2013-07-22 | Discharge: 2013-07-22 | Disposition: A | Discharge status: Home / Self Care | Payer: 59 | Source: Ambulatory Visit | Attending: Family Medicine | Admitting: Family Medicine

## 2013-07-22 DIAGNOSIS — M25529 Pain in unspecified elbow: Secondary | ICD-10-CM

## 2013-07-22 DIAGNOSIS — M7710 Lateral epicondylitis, unspecified elbow: Secondary | ICD-10-CM

## 2013-07-22 DIAGNOSIS — M25429 Effusion, unspecified elbow: Secondary | ICD-10-CM | POA: Insufficient documentation

## 2014-04-01 ENCOUNTER — Encounter: Payer: Self-pay | Admitting: Neurology

## 2014-04-01 ENCOUNTER — Ambulatory Visit (INDEPENDENT_AMBULATORY_CARE_PROVIDER_SITE_OTHER): Payer: 59 | Admitting: Neurology

## 2014-04-01 VITALS — BP 110/72 | HR 92 | Ht 63.0 in | Wt 125.0 lb

## 2014-04-01 DIAGNOSIS — R404 Transient alteration of awareness: Secondary | ICD-10-CM

## 2014-04-01 DIAGNOSIS — M5441 Lumbago with sciatica, right side: Secondary | ICD-10-CM

## 2014-04-01 NOTE — Patient Instructions (Signed)
1. Schedule routine EEG 2. Schedule MRI lumbar spine without contrast. If abnormal, would proceed with Physical Therapy 3. Follow-up in 3 months

## 2014-04-01 NOTE — Progress Notes (Signed)
NEUROLOGY CONSULTATION NOTE  Anna [name] MRN: [id number] DOB: 01-13-1966  Referring provider: Dr. Assunta FoundJohn [name] Primary care provider: Dr. Assunta FoundJohn [name]   Reason for consult:  Possible seizures, dizziness, near syncope, myalgias, muscle weakness  Dear Anna [name]:  Thank you for your kind referral of Anna [name] for consultation of the above symptoms. Although her history is well known to you, please allow me to reiterate it for the purpose of our medical record. Records and images were personally reviewed where available.  HISTORY OF PRESENT ILLNESS: This is a 48 year old right-handed woman with a history of depression, presenting for the above symptoms. She reports the episodes of "blackout" started at age 89 or 89, she knows something is going to happen, her vision becomes "snowy, like if you can't good TV reception, " she feels internal shaking with no outward body shaking seen. If she does not hold on, she knows she would fall. She would stop talking and feel briefly confused, anxious, with palpitations. Episodes occur a couple of times a week, last episode was a week ago. Symptoms always occur when standing, she has not had any while sitting or lying down. She recalls as a child she just fell on the floor and had several tests. Her half-sister has similar symptoms. She also reports a sensation of movement when she gets out of a car, where she has to grab on to something. If sitting and turning her head, she would feel the same sensation of movement lasting 5 minutes. No associated tinnitus, she may have some hearing loss. She denies any staring/unresponsive episodes, gaps in time, olfactory/gustatory hallucinations, myoclonic jerks. She denies any diplopia, dysarthria, dysphagia. She has frequent headaches that improve with allergy medication. Headaches are over the frontal region, with throbbing and sharp pain, usually 4/10 in intensity, lasting a few hours, with no associated nausea,  vomiting, photo/phonophobia. She takes Aleve or Ibuprofen daily for hip pain. She usually gets 5-6 hours of sleep.  A couple of months ago, she started feeling a pressure/swollen sensation in her right leg, from the mid-thigh to mid-calf region. She describes that it is not numb but feels like her muscles are twisting inside. No paresthesias. Feet are unaffected. She has low back pain, no bowel/bladder dysfunction. Left leg is unaffected. She does heavy lifting and prolonged standing at work.   She had a normal birth and early development.  There is no history of febrile convulsions, CNS infections such as meningitis/encephalitis, significant traumatic brain injury, neurosurgical procedures, or family history of seizures.  PAST MEDICAL HISTORY: Past Medical History  Diagnosis Date  . No pertinent past medical history     PAST SURGICAL HISTORY: Past Surgical History  Procedure Laterality Date  . Tonsillectomy    . Dilation and curettage of uterus      Ablation  . Cyst excision Right     hand  . Colonoscopy  01/10/2012    Procedure: COLONOSCOPY;  Surgeon: Dalia HeadingMark A Jenkins, MD;  Location: [facility];  Service: Gastroenterology;  Laterality: N/A;  . Foot surgery Left     MEDICATIONS: Current Outpatient Prescriptions on File Prior to Visit  Medication Sig Dispense Refill  . ibuprofen (ADVIL,MOTRIN) 200 MG tablet Take 400 mg by mouth every 6 (six) hours as needed. For pain     No current facility-administered medications on file prior to visit.    ALLERGIES: Allergies  Allergen Reactions  . Penicillins Other (See Comments)    Unknown childhood reaction   .  Wellbutrin [Bupropion] Itching    FAMILY HISTORY: Family History  Problem Relation Age of Onset  . Hypertension Mother   . Diabetes Mother   . Heart attack Mother   . Lymphoma Father     non-hodgkins    SOCIAL HISTORY: History   Social History  . Marital Status: Divorced    Spouse Name: N/A    Number of Children:  N/A  . Years of Education: N/A   Occupational History  . Not on file.   Social History Main Topics  . Smoking status: Current Every Day Smoker -- 0.50 packs/day    Types: Cigarettes  . Smokeless tobacco: Not on file  . Alcohol Use: 0.0 oz/week    0 Not specified per week     Comment: once every few months  . Drug Use: No  . Sexual Activity: Not on file   Other Topics Concern  . Not on file   Social History Narrative    REVIEW OF SYSTEMS: Constitutional: No fevers, chills, or sweats, no generalized fatigue, change in appetite Eyes: No visual changes, double vision, eye pain Ear, nose and throat: No hearing loss, ear pain, nasal congestion, sore throat Cardiovascular: No chest pain, palpitations Respiratory:  No shortness of breath at rest or with exertion, wheezes GastrointestinaI: No nausea, vomiting, diarrhea, abdominal pain, fecal incontinence Genitourinary:  No dysuria, urinary retention or frequency Musculoskeletal:  No neck pain, +back pain Integumentary: No rash, pruritus, skin lesions Neurological: as above Psychiatric: + depression, no insomnia, anxiety Endocrine: No palpitations, fatigue, diaphoresis, mood swings, change in appetite, change in weight, increased thirst Hematologic/Lymphatic:  No anemia, purpura, petechiae. Allergic/Immunologic: no itchy/runny eyes, nasal congestion, recent allergic reactions, rashes  PHYSICAL EXAM: Filed Vitals:   04/01/14 1440  BP: 110/72  Pulse: 92   General: No acute distress Head:  Normocephalic/atraumatic Eyes: Fundoscopic exam shows bilateral sharp discs, no vessel changes, exudates, or hemorrhages Neck: supple, no paraspinal tenderness, full range of motion Back: No paraspinal tenderness Heart: regular rate and rhythm Lungs: Clear to auscultation bilaterally. Vascular: No carotid bruits. Skin/Extremities: No rash, no edema Neurological Exam: Mental status: alert and oriented to person, place, and time, no  dysarthria or aphasia, Fund of knowledge is appropriate.  Recent and remote memory are intact.  Attention and concentration are normal.    Able to name objects and repeat phrases. Cranial nerves: CN I: not tested CN II: pupils equal, round and reactive to light, visual fields intact, fundi unremarkable. CN III, IV, VI:  full range of motion, no nystagmus, no ptosis CN V: facial sensation intact CN VII: upper and lower face symmetric CN VIII: hearing intact to finger rub CN IX, X: gag intact, uvula midline CN XI: sternocleidomastoid and trapezius muscles intact CN XII: tongue midline Bulk & Tone: normal, no fasciculations. Motor: 5/5 throughout with no pronator drift. Sensation: intact to light touch, cold, pin, vibration and joint position sense.  No extinction to double simultaneous stimulation.  Romberg test negative Deep Tendon Reflexes: +2 throughout, no ankle clonus Plantar responses: downgoing bilaterally Cerebellar: no incoordination on finger to nose, heel to shin. No dysdiadochokinesia Gait: narrow-based and steady, able to tandem walk adequately. Tremor: none  IMPRESSION: This is a 48 year old right-handed woman with a history of depression presenting with recurrent episodes where she feels she would pass out, affecting her vision and speech, she feels briefly confused. Neurological exam normal. The episodes occur when standing, suggestive of near syncope, likely vasovagal, less likely seizure. Routine EEG will  be ordered to assess for focal abnormalities that increase risk for recurrent seizures. We discussed that if EEG is abnormal, we will proceed with brain imaging, however with normal exam, would hold off for now. She has back pain and now a sensation of tightness and twisting in the right leg. MRI lumbar spine will be ordered to assess for radiculopathy, if abnormal, we discussed proceeding with physical therapy. [place] driving laws were discussed with the patient, she knows to stop  if she loses consciousness, until 6 months event-free. She will follow-up in 3 months.  Thank you for allowing me to participate in the care of this patient. Please do not hesitate to call for any questions or concerns.   Patrcia DollyKaren Nicandro Perrault, M.D.  CC: Anna. Phillips [name]

## 2014-04-05 ENCOUNTER — Encounter: Payer: Self-pay | Admitting: Neurology

## 2014-04-05 DIAGNOSIS — M5441 Lumbago with sciatica, right side: Secondary | ICD-10-CM | POA: Insufficient documentation

## 2014-04-05 DIAGNOSIS — F32A Depression, unspecified: Secondary | ICD-10-CM | POA: Insufficient documentation

## 2014-04-05 DIAGNOSIS — F329 Major depressive disorder, single episode, unspecified: Secondary | ICD-10-CM | POA: Insufficient documentation

## 2014-04-05 DIAGNOSIS — R404 Transient alteration of awareness: Secondary | ICD-10-CM | POA: Insufficient documentation

## 2014-04-08 NOTE — Progress Notes (Signed)
Done

## 2014-04-15 ENCOUNTER — Ambulatory Visit (HOSPITAL_COMMUNITY)
Admission: RE | Admit: 2014-04-15 | Discharge: 2014-04-15 | Disposition: A | Discharge status: Home / Self Care | Payer: 59 | Source: Ambulatory Visit | Attending: Neurology | Admitting: Neurology

## 2014-04-15 DIAGNOSIS — M5441 Lumbago with sciatica, right side: Secondary | ICD-10-CM | POA: Diagnosis present

## 2014-04-17 ENCOUNTER — Other Ambulatory Visit: Payer: Self-pay | Admitting: Family Medicine

## 2014-04-17 DIAGNOSIS — M5441 Lumbago with sciatica, right side: Secondary | ICD-10-CM

## 2014-04-22 ENCOUNTER — Ambulatory Visit (INDEPENDENT_AMBULATORY_CARE_PROVIDER_SITE_OTHER): Payer: 59 | Admitting: Neurology

## 2014-04-22 DIAGNOSIS — R404 Transient alteration of awareness: Secondary | ICD-10-CM

## 2014-04-25 NOTE — Procedures (Signed)
ELECTROENCEPHALOGRAM REPORT  Date of Study: 04/22/2014  Patient's Name: David StallSusan M Hineman MRN: [id number] Date of Birth: [birth year]  Referring Provider: Dr. Patrcia DollyKaren Aquino  Clinical History: This is a 48 year old woman with recurrent episodes where she feels she would pass out, affecting her vision and speech, she feels briefly confused  Medications: Ibuprofen  Technical Summary: A multichannel digital EEG recording measured by the international 10-20 system with electrodes applied with paste and impedances below 5000 ohms performed in our laboratory with EKG monitoring in an awake and asleep patient.  Hyperventilation and photic stimulation were performed.  The digital EEG was referentially recorded, reformatted, and digitally filtered in a variety of bipolar and referential montages for optimal display.    Description: The patient is awake and asleep during the recording.  During maximal wakefulness, there is a symmetric, medium voltage 10.5 Hz posterior dominant rhythm that attenuates with eye opening.  The record is symmetric.  During drowsiness and sleep, there is an increase in theta slowing of the background, at times sharply contoured over the left posterior temporal region, without clear epileptogenic potential.  Vertex waves and symmetric sleep spindles were seen.  Hyperventilation and photic stimulation did not elicit any abnormalities.  There were no clear epileptiform discharges or electrographic seizures seen.    EKG lead was unremarkable.  Impression: This awake and asleep EEG is within normal limits.  Clinical Correlation: A normal EEG does not exclude a clinical diagnosis of epilepsy.  If further clinical questions remain, prolonged EEG may be helpful.  Clinical correlation is advised.   Patrcia DollyKaren Aquino, M.D.

## 2014-05-19 ENCOUNTER — Other Ambulatory Visit: Payer: Self-pay | Admitting: Obstetrics & Gynecology

## 2014-05-19 DIAGNOSIS — R921 Mammographic calcification found on diagnostic imaging of breast: Secondary | ICD-10-CM

## 2014-05-19 DIAGNOSIS — N6320 Unspecified lump in the left breast, unspecified quadrant: Secondary | ICD-10-CM

## 2014-05-21 ENCOUNTER — Ambulatory Visit (HOSPITAL_COMMUNITY)
Admission: RE | Admit: 2014-05-21 | Discharge: 2014-05-21 | Disposition: A | Discharge status: Home / Self Care | Payer: 59 | Source: Ambulatory Visit | Attending: Neurology | Admitting: Neurology

## 2014-05-21 DIAGNOSIS — M5441 Lumbago with sciatica, right side: Secondary | ICD-10-CM

## 2014-05-21 DIAGNOSIS — M25562 Pain in left knee: Secondary | ICD-10-CM | POA: Diagnosis not present

## 2014-05-21 DIAGNOSIS — M25651 Stiffness of right hip, not elsewhere classified: Secondary | ICD-10-CM | POA: Diagnosis not present

## 2014-05-21 DIAGNOSIS — M545 Low back pain: Secondary | ICD-10-CM | POA: Diagnosis not present

## 2014-05-21 DIAGNOSIS — R6889 Other general symptoms and signs: Secondary | ICD-10-CM

## 2014-05-21 NOTE — Therapy (Signed)
[facility] [address] [place], [place], [address] Phone: [phone]   Fax:  [phone]  Physical Therapy Evaluation  Patient Details  Name: Anna StallSusan M [name] MRN: [id number] Date of Birth: Sep 09, 1965 Referring Provider:  Van ClinesAquino, Karen M, MD  Encounter Date: 05/21/2014      PT End of Session - 05/21/14 1757    Visit Number 1   Number of Visits 8   Date for PT Re-Evaluation 06/20/14   Authorization Type [employer]   Authorization - Visit Number 1   Authorization - Number of Visits 18   PT Start Time 1655   PT Stop Time 1754   PT Time Calculation (min) 59 min   Equipment Utilized During Treatment Gait belt   Activity Tolerance Patient tolerated treatment well      Past Medical History  Diagnosis Date  . No pertinent past medical history     Past Surgical History  Procedure Laterality Date  . Tonsillectomy    . Dilation and curettage of uterus      Ablation  . Cyst excision Right     hand  . Colonoscopy  01/10/2012    Procedure: COLONOSCOPY;  Surgeon: Dalia HeadingMark A Jenkins, MD;  Location: [facility];  Service: Gastroenterology;  Laterality: N/A;  . Foot surgery Left     There were no vitals taken for this visit.  Visit Diagnosis:  Bilateral low back pain with right-sided sciatica  Hip stiffness, right  Postural instability of trunk      Subjective Assessment - 05/21/14 1700    Symptoms Bilateral low back pain that radiates into Rt mid thigh to knee.    Pertinent History Patient started haing pain 6 months ago. patient had pain during work when she was standing up all day but was tolerable, now her pain has increased as her job as required more sittting over the last couple months.    How long can you sit comfortably? <30 minutes   How long can you stand comfortably? no difficulty   How long can you walk comfortably? no difficulty   Currently in Pain? Yes   Pain Score 2    Pain Location Back   Pain Orientation Posterior;Lower    Pain Descriptors / Indicators Aching  twisting in thigh   Pain Type Chronic pain   Pain Onset More than a month ago   Pain Frequency Intermittent   Aggravating Factors  unsure   Pain Relieving Factors sleeping with pillow between knees.           [facility] PT Assessment - 05/21/14 0001    Assessment   Medical Diagnosis bilateral low back pain with Rt thigh pain.    Onset Date 11/18/13   Next MD Visit Patrcia DollyKaren Aquino 2 months.    Prior Therapy none.    Balance Screen   Has the patient fallen in the past 6 months No   Has the patient had a decrease in activity level because of a fear of falling?  No   Is the patient reluctant to leave their home because of a fear of falling?  No   Cognition   Overall Cognitive Status Within Functional Limits for tasks assessed   Other:   Other/ Comments Gait: limited hip internal rotation, limited calcaneal eversion, excessive nversion, limited trunk rotation.    AROM   Overall AROM Comments thoracic spine: limited transverse plane, sagittal plane WNL.    Right Hip Internal Rotation  15   Left Hip  Internal Rotation  45   Right Ankle Dorsiflexion 10   Left Ankle Dorsiflexion 15   Lumbar Flexion 100   Lumbar Extension 30   Lumbar - Right Rotation 54   Lumbar - Left Rotation 56   Strength   Right Hip Flexion 5/5   Right Hip Extension 4+/5   Right Hip ABduction 4+/5   Left Hip Flexion 5/5   Left Hip Extension 4+/5   Left Hip ABduction 4+/5   Lumbar Flexion 4+/5   Lumbar Extension 4+/5   Flexibility   Soft Tissue Assessment /Muscle Lenght yes   Hamstrings WNL   Quadriceps WNL   Piriformis WNL   Palpation   Palpation tenderness alongsuperior border of pelvis bilaterally.                   [facility] Adult PT Treatment/Exercise - 05/21/14 0001    Lumbar Exercises: Standing   Other Standing Lumbar Exercises 2 way big reach walk to opposite side.    Lumbar Exercises: Seated   Other Seated Lumbar Exercises 3D thoracic spine excursion  with overhead reach 10x                PT Education - 05/21/14 1756    Education provided Yes   Education Details Diagnosis, prognosis and HEP including big reach walk and 3D thoracic spine excrsions.    Person(s) Educated Patient   Methods Explanation;Demonstration;Handout;Tactile cues;Verbal cues   Comprehension Verbalized understanding;Returned demonstration          PT Short Term Goals - 05/21/14 1803    PT SHORT TERM GOAL #1   Title Patiwent will be independent with intiial basic HEP.    Time 2   Period Weeks   Status New   PT SHORT TERM GOAL #2   Title Patient will be able to rotate >70 degrees bilaterally with sitting.    Time 2   Period Weeks   Status New   PT SHORT TERM GOAL #3   Title Patient will dmeosntrate increased hip internal rotation of 30 degrees   Time 2   Period Weeks   Status New           PT Long Term Goals - 05/21/14 1805    PT LONG TERM GOAL #1   Title Patient will be able to rotate  80 degrees bilaterally with sitting to decrease rotational strain on Lumbar spine   Time 4   Period Weeks   Status New   PT LONG TERM GOAL #2   Title Patient will dmeosntrate increased hip internal rotation of 35 degrees to decrease rotational strain on lumbar spine.    Time 4   Period Weeks   Status New   PT LONG TERM GOAL #3   Title Patient will be independent with advanced HEP includind mobility and stability exercises.    Time 4   Period Weeks   Status New               Plan - 05/21/14 1758    Clinical Impression Statement Patient demonstrates low back pain with radiating symptoms into Rt thigh and knee secondary to limited thoracic spine rotational mobility and limited Rt hip internal rotation resultign in increased difficulty durign transverse plane reachingthat is further exacerbated with prolonged sitting required for patients job whcih prevents her hips from assisting h=in her rotating. Patient demosntrated a positive reasponse to  initial exercises with decreased pain and increased transverse plane ROM. Patient will beenfit from contineud pshycial therapy  to increase thoracic spine and hip ROm and increased low back stability.    Pt will benefit from skilled therapeutic intervention in order to improve on the following deficits Abnormal gait;Improper body mechanics;Postural dysfunction;Impaired flexibility;Decreased strength;Pain   Rehab Potential Excellent   PT Frequency 2x / week   PT Duration 4 weeks   PT Treatment/Interventions Gait training;Functional mobility training;Patient/family education;Passive range of motion;Manual techniques;Therapeutic exercise   PT Next Visit Plan Next session continue education on HEP including squat walk arounds and overhead UE dumbbell matrix. also introduce transverse plane rows.    PT Home Exercise Plan given: 3D thoracic spine excursions with overhead reach, and 2 way big reach walks.    Consulted and Agree with Plan of Care Patient         Problem List Patient Active Problem List   Diagnosis Date Noted  . Depression 04/05/2014  . Bilateral low back pain with right-sided sciatica 04/05/2014  . Awareness alteration, transient 04/05/2014   Jerilee Field PT DPT [phone]  [facility] [address] [place], [place], [address] Phone: [phone]   Fax:  [phone]

## 2014-05-27 ENCOUNTER — Ambulatory Visit (HOSPITAL_COMMUNITY)
Admission: RE | Admit: 2014-05-27 | Discharge: 2014-05-27 | Disposition: A | Discharge status: Home / Self Care | Payer: 59 | Source: Ambulatory Visit | Attending: Neurology | Admitting: Neurology

## 2014-05-27 ENCOUNTER — Other Ambulatory Visit (HOSPITAL_COMMUNITY): Payer: Self-pay | Admitting: Family Medicine

## 2014-05-27 ENCOUNTER — Ambulatory Visit (HOSPITAL_COMMUNITY)
Admission: RE | Admit: 2014-05-27 | Discharge: 2014-05-27 | Disposition: A | Discharge status: Home / Self Care | Payer: 59 | Source: Ambulatory Visit | Attending: Family Medicine | Admitting: Family Medicine

## 2014-05-27 ENCOUNTER — Other Ambulatory Visit (HOSPITAL_COMMUNITY)
Admission: RE | Admit: 2014-05-27 | Discharge: 2014-05-27 | Disposition: A | Discharge status: Home / Self Care | Payer: 59 | Source: Ambulatory Visit | Attending: Obstetrics & Gynecology | Admitting: Obstetrics & Gynecology

## 2014-05-27 ENCOUNTER — Encounter: Payer: Self-pay | Admitting: Obstetrics & Gynecology

## 2014-05-27 ENCOUNTER — Ambulatory Visit (INDEPENDENT_AMBULATORY_CARE_PROVIDER_SITE_OTHER): Payer: 59 | Admitting: Obstetrics & Gynecology

## 2014-05-27 VITALS — BP 130/80 | Ht 63.2 in | Wt 125.4 lb

## 2014-05-27 DIAGNOSIS — M5441 Lumbago with sciatica, right side: Secondary | ICD-10-CM

## 2014-05-27 DIAGNOSIS — M545 Low back pain: Secondary | ICD-10-CM | POA: Diagnosis not present

## 2014-05-27 DIAGNOSIS — F17200 Nicotine dependence, unspecified, uncomplicated: Secondary | ICD-10-CM

## 2014-05-27 DIAGNOSIS — R059 Cough, unspecified: Secondary | ICD-10-CM

## 2014-05-27 DIAGNOSIS — R05 Cough: Secondary | ICD-10-CM | POA: Diagnosis present

## 2014-05-27 DIAGNOSIS — Z01419 Encounter for gynecological examination (general) (routine) without abnormal findings: Secondary | ICD-10-CM | POA: Diagnosis not present

## 2014-05-27 DIAGNOSIS — M25651 Stiffness of right hip, not elsewhere classified: Secondary | ICD-10-CM

## 2014-05-27 DIAGNOSIS — R6889 Other general symptoms and signs: Secondary | ICD-10-CM

## 2014-05-27 DIAGNOSIS — Z1151 Encounter for screening for human papillomavirus (HPV): Secondary | ICD-10-CM | POA: Diagnosis present

## 2014-05-27 DIAGNOSIS — Z720 Tobacco use: Secondary | ICD-10-CM | POA: Insufficient documentation

## 2014-05-27 NOTE — Progress Notes (Signed)
Patient ID: Anna StallSusan M [name], female   DOB: 25-Jun-1965, 49 y.o.   MRN: [id number] Subjective:     Anna StallSusan M [name] is a 49 y.o. female here for a routine exam.  No LMP recorded. Patient has had an ablation. No obstetric history on file. Birth Control Method:  N/a; s/p ablation  Menstrual Calendar(currently): s/p ablation   Current complaints: none.   Current acute medical issues:  none   Recent Gynecologic History No LMP recorded. Patient has had an ablation. Last Pap: three years ago,  Unsure but reports having a colposcopy  Last mammogram: scheduled for tomorrow  Past Medical History  Diagnosis Date  . No pertinent past medical history     Past Surgical History  Procedure Laterality Date  . Tonsillectomy    . Dilation and curettage of uterus      Ablation  . Cyst excision Right     hand  . Colonoscopy  01/10/2012    Procedure: COLONOSCOPY;  Surgeon: Dalia HeadingMark A Jenkins, MD;  Location: [facility];  Service: Gastroenterology;  Laterality: N/A;  . Foot surgery Left   . Rt hand surgery      OB History    No data available      History   Social History  . Marital Status: Divorced    Spouse Name: N/A    Number of Children: N/A  . Years of Education: N/A   Social History Main Topics  . Smoking status: Current Every Day Smoker -- 0.50 packs/day    Types: Cigarettes  . Smokeless tobacco: None  . Alcohol Use: 0.0 oz/week    0 Not specified per week     Comment: once every few months  . Drug Use: No  . Sexual Activity: None   Other Topics Concern  . None   Social History Narrative    Family History  Problem Relation Age of Onset  . Hypertension Mother   . Diabetes Mother   . Heart attack Mother   . Lymphoma Father     non-hodgkins     Current outpatient prescriptions:  .  ibuprofen (ADVIL,MOTRIN) 200 MG tablet, Take 400 mg by mouth every 6 (six) hours as needed. For pain, Disp: , Rfl:  .  Multiple Vitamin (MULTIVITAMIN) tablet, Take 1 tablet by mouth daily., Disp: ,  Rfl:  .  zolpidem (AMBIEN) 10 MG tablet, Take 10 mg by mouth at bedtime as needed for sleep., Disp: , Rfl:   Review of Systems  Review of Systems  Constitutional: Negative for fever, chills, weight loss, malaise/fatigue and diaphoresis.  HENT: Negative for hearing loss, ear pain, nosebleeds, congestion, sore throat, neck pain, tinnitus and ear discharge.   Eyes: Negative for blurred vision, double vision, photophobia, pain, discharge and redness.  Respiratory: Negative for cough, hemoptysis, sputum production, shortness of breath, wheezing and stridor.   Cardiovascular: Negative for chest pain, palpitations, orthopnea, claudication, leg swelling and PND.  Gastrointestinal: negative for abdominal pain. Negative for heartburn, nausea, vomiting, diarrhea, constipation, blood in stool and melena.  Genitourinary: Negative for dysuria, urgency, frequency, hematuria and flank pain.  Musculoskeletal: Negative for myalgias, back pain, joint pain and falls.  Skin: Negative for itching and rash.  Neurological: Negative for dizziness, tingling, tremors, sensory change, speech change, focal weakness, seizures, loss of consciousness, weakness and headaches.  Endo/Heme/Allergies: Negative for environmental allergies and polydipsia. Does not bruise/bleed easily.  Psychiatric/Behavioral: Negative for depression, suicidal ideas, hallucinations, memory loss and substance abuse. The patient is not nervous/anxious and does  not have insomnia.        Objective:  Blood pressure 130/80, height 5' 3.2" (1.605 m), weight 125 lb 6.4 oz (56.881 kg).   Physical Exam  Vitals reviewed. Constitutional: She is oriented to person, place, and time. She appears well-developed and well-nourished.  HENT:  Head: Normocephalic and atraumatic.        Right Ear: External ear normal.  Left Ear: External ear normal.  Nose: Nose normal.  Mouth/Throat: Oropharynx is clear and moist.  Eyes: Conjunctivae and EOM are normal. Pupils  are equal, round, and reactive to light. Right eye exhibits no discharge. Left eye exhibits no discharge. No scleral icterus.  Neck: Normal range of motion. Neck supple. No tracheal deviation present. No thyromegaly present.  Cardiovascular: Normal rate, regular rhythm, normal heart sounds and intact distal pulses.  Exam reveals no gallop and no friction rub.   No murmur heard. Respiratory: Effort normal and breath sounds normal. No respiratory distress. She has no wheezes. She has no rales. She exhibits no tenderness.  GI: Soft. Bowel sounds are normal. She exhibits no distension and no mass. There is no tenderness. There is no rebound and no guarding.  Genitourinary:  Breasts no masses skin changes or nipple changes bilaterally      Vulva is normal without lesions Vagina is pink moist without discharge Cervix normal in appearance and pap is done Uterus is normal size shape and contour Adnexa is negative with normal sized ovaries   Musculoskeletal: Normal range of motion. She exhibits no edema and no tenderness.  Neurological: She is alert and oriented to person, place, and time. She has normal reflexes. She displays normal reflexes. No cranial nerve deficit. She exhibits normal muscle tone. Coordination normal.  Skin: Skin is warm and dry. No rash noted. No erythema. No pallor.  Psychiatric: She has a normal mood and affect. Her behavior is normal. Judgment and thought content normal.       Assessment:    Healthy female exam.    Plan:    Education reviewed: smoking cessation. Follow up in: 1 year.

## 2014-05-27 NOTE — Therapy (Signed)
[facility] [address] [place], [place], [address] Phone: [phone]   Fax:  [phone]  Physical Therapy Treatment  Patient Details  Name: Anna StallSusan M [name] MRN: [id number] Date of Birth: 1965/12/23 Referring Provider:  Van ClinesAquino, Karen M, MD  Encounter Date: 05/27/2014      PT End of Session - 05/27/14 1717    Visit Number 2   Number of Visits 8   Date for PT Re-Evaluation 06/20/14   Authorization Type [employer]   Authorization - Visit Number 2   Authorization - Number of Visits 18   PT Start Time 1648   PT Stop Time 1730   PT Time Calculation (min) 42 min   Activity Tolerance Patient tolerated treatment well   Behavior During Therapy [facility] for tasks assessed/performed      Past Medical History  Diagnosis Date  . No pertinent past medical history     Past Surgical History  Procedure Laterality Date  . Tonsillectomy    . Dilation and curettage of uterus      Ablation  . Cyst excision Right     hand  . Colonoscopy  01/10/2012    Procedure: COLONOSCOPY;  Surgeon: Dalia HeadingMark A Jenkins, MD;  Location: [facility];  Service: Gastroenterology;  Laterality: N/A;  . Foot surgery Left   . Rt hand surgery      There were no vitals taken for this visit.  Visit Diagnosis:  Bilateral low back pain with right-sided sciatica  Hip stiffness, right  Postural instability of trunk      Subjective Assessment - 05/27/14 1655    Symptoms Pain free today, has been compliant wiht HEP and reports the stretches help alot.     Currently in Pain? No/denies                    [facility] - 05/27/14 1713    Exercises   Exercises Lumbar   Lumbar Exercises: Stretches   Active Hamstring Stretch 3 reps;30 seconds   Active Hamstring Stretch Limitations 12 in step   Hip Flexor Stretch 2 reps;30 seconds   Hip Flexor Stretch Limitations 12in step   Piriformis Stretch 3 reps;30 seconds   Piriformis Stretch Limitations seated    Lumbar Exercises: Standing   Functional Squats 10 reps   Functional Squats Limitations 3D hip excursion; 10 reps squat and walk around for IR   Row Right;Left;10 reps   Theraband Level (Row) Level 3 (Green)   Row Limitations unilateral   Other Standing Lumbar Exercises 2 way big reach walk to opposite side. 2RT each   Lumbar Exercises: Seated   Other Seated Lumbar Exercises 3D thoracic spine excursion with overhead reach 10x                  PT Short Term Goals - 05/27/14 1716    PT SHORT TERM GOAL #1   Title Patiwent will be independent with intiial basic HEP.    Status On-going   PT SHORT TERM GOAL #2   Title Patient will be able to rotate >70 degrees bilaterally with sitting.    Status On-going   PT SHORT TERM GOAL #3   Title Patient will dmeosntrate increased hip internal rotation of 30 degrees   Status On-going           PT Long Term Goals - 05/27/14 1716    PT LONG TERM GOAL #1   Title Patient will be able to rotate  80 degrees bilaterally with sitting to decrease rotational strain on Lumbar spine   PT LONG TERM GOAL #2   Title Patient will dmeosntrate increased hip internal rotation of 35 degrees to decrease rotational strain on lumbar spine.    PT LONG TERM GOAL #3   Title Patient will be independent with advanced HEP includind mobility and stability exercises.                Plan - 05/27/14 1718    Clinical Impression Statement Began PT POC initially reviewing HEp to assure correct technique with activites, min cueing required for sequencing with 2way big reach walk.  Progressed spinal mobilty with 3D thoracic, hip excursion and overhead UE dumbbell matrix, pt perfiormed correctly following demonstration and min cueing for technique.  No reports of pain through session.     PT Next Visit Plan Continue PT POC, next session begin pick up and reach with 3# dumbbell.  Continue with 3D thoracic and hip excursion, unilateral rows with green tband.         Problem List Patient Active Problem List   Diagnosis Date Noted  . Depression 04/05/2014  . Bilateral low back pain with right-sided sciatica 04/05/2014  . Awareness alteration, transient 04/05/2014   Becky Sax, LPTA [phone]  Juel Burrow 05/27/2014, 5:32 PM  [facility] [address] [place], [place], [address] Phone: [phone]   Fax:  [phone]

## 2014-05-28 ENCOUNTER — Ambulatory Visit
Admission: RE | Admit: 2014-05-28 | Discharge: 2014-05-28 | Disposition: A | Discharge status: Home / Self Care | Payer: 59 | Source: Ambulatory Visit | Attending: Obstetrics & Gynecology | Admitting: Obstetrics & Gynecology

## 2014-05-28 DIAGNOSIS — N6320 Unspecified lump in the left breast, unspecified quadrant: Secondary | ICD-10-CM

## 2014-05-28 DIAGNOSIS — R921 Mammographic calcification found on diagnostic imaging of breast: Secondary | ICD-10-CM

## 2014-05-29 ENCOUNTER — Ambulatory Visit (HOSPITAL_COMMUNITY): Payer: 59 | Admitting: Physical Therapy

## 2014-05-29 LAB — CYTOLOGY - PAP

## 2014-06-03 ENCOUNTER — Encounter (HOSPITAL_COMMUNITY): Payer: 59

## 2014-06-05 ENCOUNTER — Encounter (HOSPITAL_COMMUNITY): Payer: 59 | Admitting: Physical Therapy

## 2014-06-10 ENCOUNTER — Encounter (HOSPITAL_COMMUNITY): Payer: 59

## 2014-06-11 ENCOUNTER — Encounter (HOSPITAL_COMMUNITY): Payer: Self-pay | Admitting: Physical Therapy

## 2014-06-11 NOTE — Therapy (Signed)
[facility] [place], [place], [address] Phone: [phone]   Fax:  [phone]  Patient Details  Name: Anna [name] MRN: [id number] Date of Birth: 11-Feb-1966 Referring Provider:  No ref. provider found  Encounter Date: 06/11/2014  PHYSICAL THERAPY DISCHARGE SUMMARY  Visits from Start of Care: 2  Current functional level related to goals / functional outcomes: PT SHORT TERM GOAL #1    Title Patiwent will be independent with intiial basic HEP.    Status Not met   PT SHORT TERM GOAL #2   Title Patient will be able to rotate >70 degrees bilaterally with sitting.    Status Not met   PT SHORT TERM GOAL #3   Title Patient will dmeosntrate increased hip internal rotation of 30 degrees   Status Not met           PT Long Term Goals - 05/27/14 1716    PT LONG TERM GOAL #1   Title Patient will be able to rotate 80 degrees bilaterally with sitting to decrease rotational strain on Lumbar spine Not met   PT LONG TERM GOAL #2   Title Patient will dmeosntrate increased hip internal rotation of 35 degrees to decrease rotational strain on lumbar spine.  Not met   PT LONG TERM GOAL #3   Title Patient will be independent with advanced HEP includind mobility and stability exercises.  Not met      Plan: Patient agrees to discharge.  Patient goals were not met. Patient is being discharged due to the patient's request.  ?????        Devona Konig PT DPT [facility] [address] [place], [place], [address] Phone: [phone]   Fax:  [phone]

## 2014-06-12 ENCOUNTER — Encounter (HOSPITAL_COMMUNITY): Payer: 59 | Admitting: Physical Therapy

## 2014-06-17 ENCOUNTER — Encounter (HOSPITAL_COMMUNITY): Payer: 59

## 2014-06-19 ENCOUNTER — Encounter (HOSPITAL_COMMUNITY): Payer: 59 | Admitting: Physical Therapy

## 2014-07-02 ENCOUNTER — Ambulatory Visit (INDEPENDENT_AMBULATORY_CARE_PROVIDER_SITE_OTHER): Payer: 59 | Admitting: Neurology

## 2014-07-02 ENCOUNTER — Encounter: Payer: Self-pay | Admitting: Neurology

## 2014-07-02 VITALS — BP 110/72 | HR 98 | Resp 16 | Ht 63.0 in | Wt 127.0 lb

## 2014-07-02 DIAGNOSIS — M5441 Lumbago with sciatica, right side: Secondary | ICD-10-CM

## 2014-07-02 DIAGNOSIS — R404 Transient alteration of awareness: Secondary | ICD-10-CM

## 2014-07-02 NOTE — Patient Instructions (Signed)
1. Continue to monitor your symptoms, if any changes, call our office 2. Continue home PT exercises 3. Follow-up in 3 months

## 2014-07-02 NOTE — Progress Notes (Signed)
NEUROLOGY FOLLOW UP OFFICE NOTE  Anna [name] [id number]  HISTORY OF PRESENT ILLNESS: I had the pleasure of seeing Anna [name] in follow-up in the neurology clinic on 07/02/2014.  The patient was last seen 3 months ago for episodes where she feel she would pass out, affecting her vision and speech, where she feels briefly confused. Routine EEG did not show any epileptiform discharges. Since her last visit, she denies any further similar episodes. She had a 30-day monitor, results unavailable. She had a normal stress test and echo per patient. She denies any dizziness. She had also reported back pain. 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 She underwent PT and states that she is very glad she went, it made a world of difference.  She continues to have frequent headaches, relieved when she takes allergy medication.   HPI: This is a 49 yo RH woman with a history of depression who presented with episodes of "blackout" started at age 5 or 65, she knows something is going to happen, her vision becomes "snowy, like if you can't good TV reception, " she feels internal shaking with no outward body shaking seen. If she does not hold on, she knows she would fall. She would stop talking and feel briefly confused, anxious, with palpitations. Episodes occur a couple of times a week, last episode was a week ago. Symptoms always occur when standing, she has not had any while sitting or lying down. She recalls as a child she just fell on the floor and had several tests. Her half-sister has similar symptoms. She also reports a sensation of movement when she gets out of a car, where she has to grab on to something. If sitting and turning her head, she would feel the same sensation of movement lasting 5 minutes. No associated tinnitus, she may have some hearing loss. She denies any staring/unresponsive episodes,  gaps in time, olfactory/gustatory hallucinations, myoclonic jerks. She denies any diplopia, dysarthria, dysphagia. She has frequent headaches that improve with allergy medication. Headaches are over the frontal region, with throbbing and sharp pain, usually 4/10 in intensity, lasting a few hours, with no associated nausea, vomiting, photo/phonophobia. She takes Aleve or Ibuprofen daily for hip pain. She usually gets 5-6 hours of sleep.  PAST MEDICAL HISTORY: Past Medical History  Diagnosis Date  . No pertinent past medical history     MEDICATIONS: Current Outpatient Prescriptions on File Prior to Visit  Medication Sig Dispense Refill  . ibuprofen (ADVIL,MOTRIN) 200 MG tablet Take 400 mg by mouth every 6 (six) hours as needed. For pain    . Multiple Vitamin (MULTIVITAMIN) tablet Take 1 tablet by mouth daily.    [name] zolpidem (AMBIEN) 10 MG tablet Take 10 mg by mouth at bedtime as needed for sleep.     No current facility-administered medications on file prior to visit.    ALLERGIES: Allergies  Allergen Reactions  . Penicillins Other (See Comments)    Unknown childhood reaction   . Wellbutrin [Bupropion] Itching    FAMILY HISTORY: Family History  Problem Relation Age of Onset  . Hypertension Mother   . Diabetes Mother   . Heart attack Mother   . Lymphoma Father     non-hodgkins    SOCIAL HISTORY: History   Social History  . Marital Status: Divorced    Spouse Name: N/A  . Number of Children: N/A  . Years of  Education: N/A   Occupational History  . Not on file.   Social History Main Topics  . Smoking status: Current Every Day Smoker -- 0.50 packs/day    Types: Cigarettes  . Smokeless tobacco: Not on file  . Alcohol Use: 0.0 oz/week    0 Standard drinks or equivalent per week     Comment: once every few months  . Drug Use: No  . Sexual Activity: Not on file   Other Topics Concern  . Not on file   Social History Narrative    REVIEW OF SYSTEMS: Constitutional:  No fevers, chills, or sweats, no generalized fatigue, change in appetite Eyes: No visual changes, double vision, eye pain Ear, nose and throat: No hearing loss, ear pain, nasal congestion, sore throat Cardiovascular: No chest pain, palpitations Respiratory:  No shortness of breath at rest or with exertion, wheezes GastrointestinaI: No nausea, vomiting, diarrhea, abdominal pain, fecal incontinence Genitourinary:  No dysuria, urinary retention or frequency Musculoskeletal:  No neck pain, back pain Integumentary: No rash, pruritus, skin lesions Neurological: as above Psychiatric: No depression, insomnia, anxiety Endocrine: No palpitations, fatigue, diaphoresis, mood swings, change in appetite, change in weight, increased thirst Hematologic/Lymphatic:  No anemia, purpura, petechiae. Allergic/Immunologic: no itchy/runny eyes, nasal congestion, recent allergic reactions, rashes  PHYSICAL EXAM: Filed Vitals:   07/02/14 1442  BP: 110/72  Pulse: 98  Resp: 16   General: No acute distress Head:  Normocephalic/atraumatic Neck: supple, no paraspinal tenderness, full range of motion Heart:  Regular rate and rhythm Lungs:  Clear to auscultation bilaterally Back: No paraspinal tenderness Skin/Extremities: No rash, no edema Neurological Exam: alert and oriented to person, place, and time. No aphasia or dysarthria. Fund of knowledge is appropriate.  Recent and remote memory are intact.  Attention and concentration are normal.    Able to name objects and repeat phrases. Cranial nerves: Pupils equal, round, reactive to light.  Fundoscopic exam unremarkable, no papilledema. Extraocular movements intact with no nystagmus. Visual fields full. Facial sensation intact. No facial asymmetry. Tongue, uvula, palate midline.  Motor: Bulk and tone normal, muscle strength 5/5 throughout with no pronator drift.  Sensation to light touch intact.  No extinction to double simultaneous stimulation.  Deep tendon reflexes 2+  throughout, toes downgoing.  Finger to nose testing intact.  Gait narrow-based and steady, able to tandem walk adequately.  Romberg negative.  IMPRESSION: This is a 49 yo RH woman with a history of depression with recurrent episodes where she feels she would pass out, affecting her vision and speech, she feels briefly confused. Neurological exam normal EEG normal. The episodes occur when standing, suggestive of near syncope, likely vasovagal. Cardiac evaluation normal. She denies any further similar symptoms in the past 3 months. Continue to monitor clinically, she knows to call our office for any changes. She will continue home PT exercises for back pain. She will follow-up in 3 months.  Thank you for allowing me to participate in her care.  Please do not hesitate to call for any questions or concerns.  The duration of this appointment visit was 15 minutes of face-to-face time with the patient.  Greater than 50% of this time was spent in counseling, explanation of diagnosis, planning of further management, and coordination of care.   Patrcia DollyKaren Aquino, M.D.   CC: Dr. Phillips OdorGolding

## 2014-07-08 ENCOUNTER — Encounter: Payer: Self-pay | Admitting: Neurology

## 2014-10-10 ENCOUNTER — Ambulatory Visit: Payer: 59 | Admitting: Neurology

## 2015-05-19 ENCOUNTER — Other Ambulatory Visit: Payer: Self-pay | Admitting: Family Medicine

## 2015-05-19 ENCOUNTER — Other Ambulatory Visit: Payer: Self-pay

## 2015-05-19 DIAGNOSIS — R921 Mammographic calcification found on diagnostic imaging of breast: Secondary | ICD-10-CM

## 2015-05-28 ENCOUNTER — Other Ambulatory Visit: Payer: Self-pay | Admitting: Family Medicine

## 2015-05-28 DIAGNOSIS — Z1231 Encounter for screening mammogram for malignant neoplasm of breast: Secondary | ICD-10-CM

## 2015-06-02 ENCOUNTER — Other Ambulatory Visit (HOSPITAL_COMMUNITY)
Admission: RE | Admit: 2015-06-02 | Discharge: 2015-06-02 | Disposition: A | Discharge status: Home / Self Care | Payer: BLUE CROSS/BLUE SHIELD | Source: Ambulatory Visit | Attending: Obstetrics & Gynecology | Admitting: Obstetrics & Gynecology

## 2015-06-02 ENCOUNTER — Encounter: Payer: Self-pay | Admitting: Obstetrics & Gynecology

## 2015-06-02 ENCOUNTER — Ambulatory Visit (INDEPENDENT_AMBULATORY_CARE_PROVIDER_SITE_OTHER): Payer: BLUE CROSS/BLUE SHIELD | Admitting: Obstetrics & Gynecology

## 2015-06-02 VITALS — BP 120/80 | HR 76 | Ht 63.0 in | Wt 133.0 lb

## 2015-06-02 DIAGNOSIS — Z01419 Encounter for gynecological examination (general) (routine) without abnormal findings: Secondary | ICD-10-CM | POA: Diagnosis not present

## 2015-06-02 DIAGNOSIS — Z1211 Encounter for screening for malignant neoplasm of colon: Secondary | ICD-10-CM

## 2015-06-02 DIAGNOSIS — Z1212 Encounter for screening for malignant neoplasm of rectum: Secondary | ICD-10-CM

## 2015-06-02 NOTE — Progress Notes (Signed)
Patient ID: Anna [name], female   DOB: [birth year], 50 y.o.   MRN: [id number] Subjective:     Anna [name] is a 50 y.o. female here for a routine exam.  No LMP recorded. Patient has had an ablation. No obstetric history on file. Birth Control Method:  Ablation Menstrual Calendar(currently): Amenorrheic  Current complaints: None.   Current acute medical issues:  None   Recent Gynecologic History No LMP recorded. Patient has had an ablation. Last Pap: 2015,  normal Last mammogram: 2016,  normal  Past Medical History  Diagnosis Date  . No pertinent past medical history   . Arthritis     Past Surgical History  Procedure Laterality Date  . Tonsillectomy    . Dilation and curettage of uterus      Ablation  . Cyst excision Right     hand  . Colonoscopy  01/10/2012    Procedure: COLONOSCOPY;  Surgeon: Dalia Heading, MD;  Location: [facility];  Service: Gastroenterology;  Laterality: N/A;  . Foot surgery Left   . Rt hand surgery      OB History    No data available      Social History   Social History  . Marital Status: Divorced    Spouse Name: N/A  . Number of Children: N/A  . Years of Education: N/A   Social History Main Topics  . Smoking status: Current Every Day Smoker -- 0.50 packs/day    Types: Cigarettes  . Smokeless tobacco: None  . Alcohol Use: 0.0 oz/week    0 Standard drinks or equivalent per week     Comment: once every few months  . Drug Use: No  . Sexual Activity: Not Asked   Other Topics Concern  . None   Social History Narrative    Family History  Problem Relation Age of Onset  . Hypertension Mother   . Diabetes Mother   . Heart attack Mother   . Lymphoma Father     non-hodgkins     Current outpatient prescriptions:  .  ibuprofen (ADVIL,MOTRIN) 200 MG tablet, Take 400 mg by mouth every 6 (six) hours as needed. For pain, Disp: , Rfl:  .  Multiple Vitamin (MULTIVITAMIN) tablet, Take 1 tablet by mouth daily., Disp: , Rfl:  .  zolpidem  (AMBIEN) 10 MG tablet, Take 10 mg by mouth at bedtime as needed for sleep., Disp: , Rfl:  .  diazepam (VALIUM) 5 MG tablet, Take 5 mg by mouth 3 (three) times daily as needed for anxiety. Reported on 06/02/2015, Disp: , Rfl:   Review of Systems  Review of Systems  Constitutional: Negative for fever, chills, weight loss, malaise/fatigue and diaphoresis.  HENT: Negative for hearing loss, ear pain, nosebleeds, congestion, sore throat, neck pain, tinnitus and ear discharge.   Eyes: Negative for blurred vision, double vision, photophobia, pain, discharge and redness.  Respiratory: Negative for cough, hemoptysis, sputum production, shortness of breath, wheezing and stridor.   Cardiovascular: Negative for chest pain, palpitations, orthopnea, claudication, leg swelling and PND.  Gastrointestinal: negative for abdominal pain. Negative for heartburn, nausea, vomiting, diarrhea, constipation, blood in stool and melena.  Genitourinary: Negative for dysuria, urgency, frequency, hematuria and flank pain.  Musculoskeletal: Negative for myalgias, back pain, joint pain and falls.  Skin: Negative for itching and rash.  Neurological: Negative for dizziness, tingling, tremors, sensory change, speech change, focal weakness, seizures, loss of consciousness, weakness and headaches.  Endo/Heme/Allergies: Negative for environmental allergies and polydipsia. Does not bruise/bleed  easily.  Psychiatric/Behavioral: Negative for depression, suicidal ideas, hallucinations, memory loss and substance abuse. The patient is not nervous/anxious and does not have insomnia.        Objective:  Blood pressure 120/80, pulse 76, height  (1.6 m), weight 133 lb (60.328 kg).   Physical Exam  Vitals reviewed. Constitutional: She is oriented to person, place, and time. She appears well-developed and well-nourished.  HENT:  Head: Normocephalic and atraumatic.        Right Ear: External ear normal.  Left Ear: External ear normal.   Nose: Nose normal.  Mouth/Throat: Oropharynx is clear and moist.  Eyes: Conjunctivae and EOM are normal. Pupils are equal, round, and reactive to light. Right eye exhibits no discharge. Left eye exhibits no discharge. No scleral icterus.  Neck: Normal range of motion. Neck supple. No tracheal deviation present. No thyromegaly present.  Cardiovascular: Normal rate, regular rhythm, normal heart sounds and intact distal pulses.  Exam reveals no gallop and no friction rub.   No murmur heard. Respiratory: Effort normal and breath sounds normal. No respiratory distress. She has no wheezes. She has no rales. She exhibits no tenderness.  GI: Soft. Bowel sounds are normal. She exhibits no distension and no mass. There is no tenderness. There is no rebound and no guarding.  Genitourinary:  Breasts no masses skin changes or nipple changes bilaterally      Vulva is normal without lesions Vagina is pink moist without discharge Cervix normal in appearance and pap is done Uterus is normal size shape and contour Adnexa is negative with normal sized ovaries  {Rectal    hemoccult negative, normal tone, no masses  Musculoskeletal: Normal range of motion. She exhibits no edema and no tenderness.  Neurological: She is alert and oriented to person, place, and time. She has normal reflexes. She displays normal reflexes. No cranial nerve deficit. She exhibits normal muscle tone. Coordination normal.  Skin: Skin is warm and dry. No rash noted. No erythema. No pallor.  Psychiatric: She has a normal mood and affect. Her behavior is normal. Judgment and thought content normal.       Assessment:    Healthy female exam.    Plan:    Mammogram ordered. Follow up in: 1 year.   No orders of the defined types were placed in this encounter.

## 2015-06-04 ENCOUNTER — Ambulatory Visit
Admission: RE | Admit: 2015-06-04 | Discharge: 2015-06-04 | Disposition: A | Discharge status: Home / Self Care | Payer: Self-pay | Source: Ambulatory Visit | Attending: Family Medicine | Admitting: Family Medicine

## 2015-06-04 DIAGNOSIS — Z1231 Encounter for screening mammogram for malignant neoplasm of breast: Secondary | ICD-10-CM

## 2015-06-05 LAB — CYTOLOGY - PAP

## 2015-11-18 IMAGING — CR DG CHEST 2V
2 series · 2 of 2 positions shown · non-contrast
Comparison: 11/15/2012

CLINICAL DATA: Cough.

EXAM:
CHEST  2 VIEW

[view not recorded (1 of 2)]
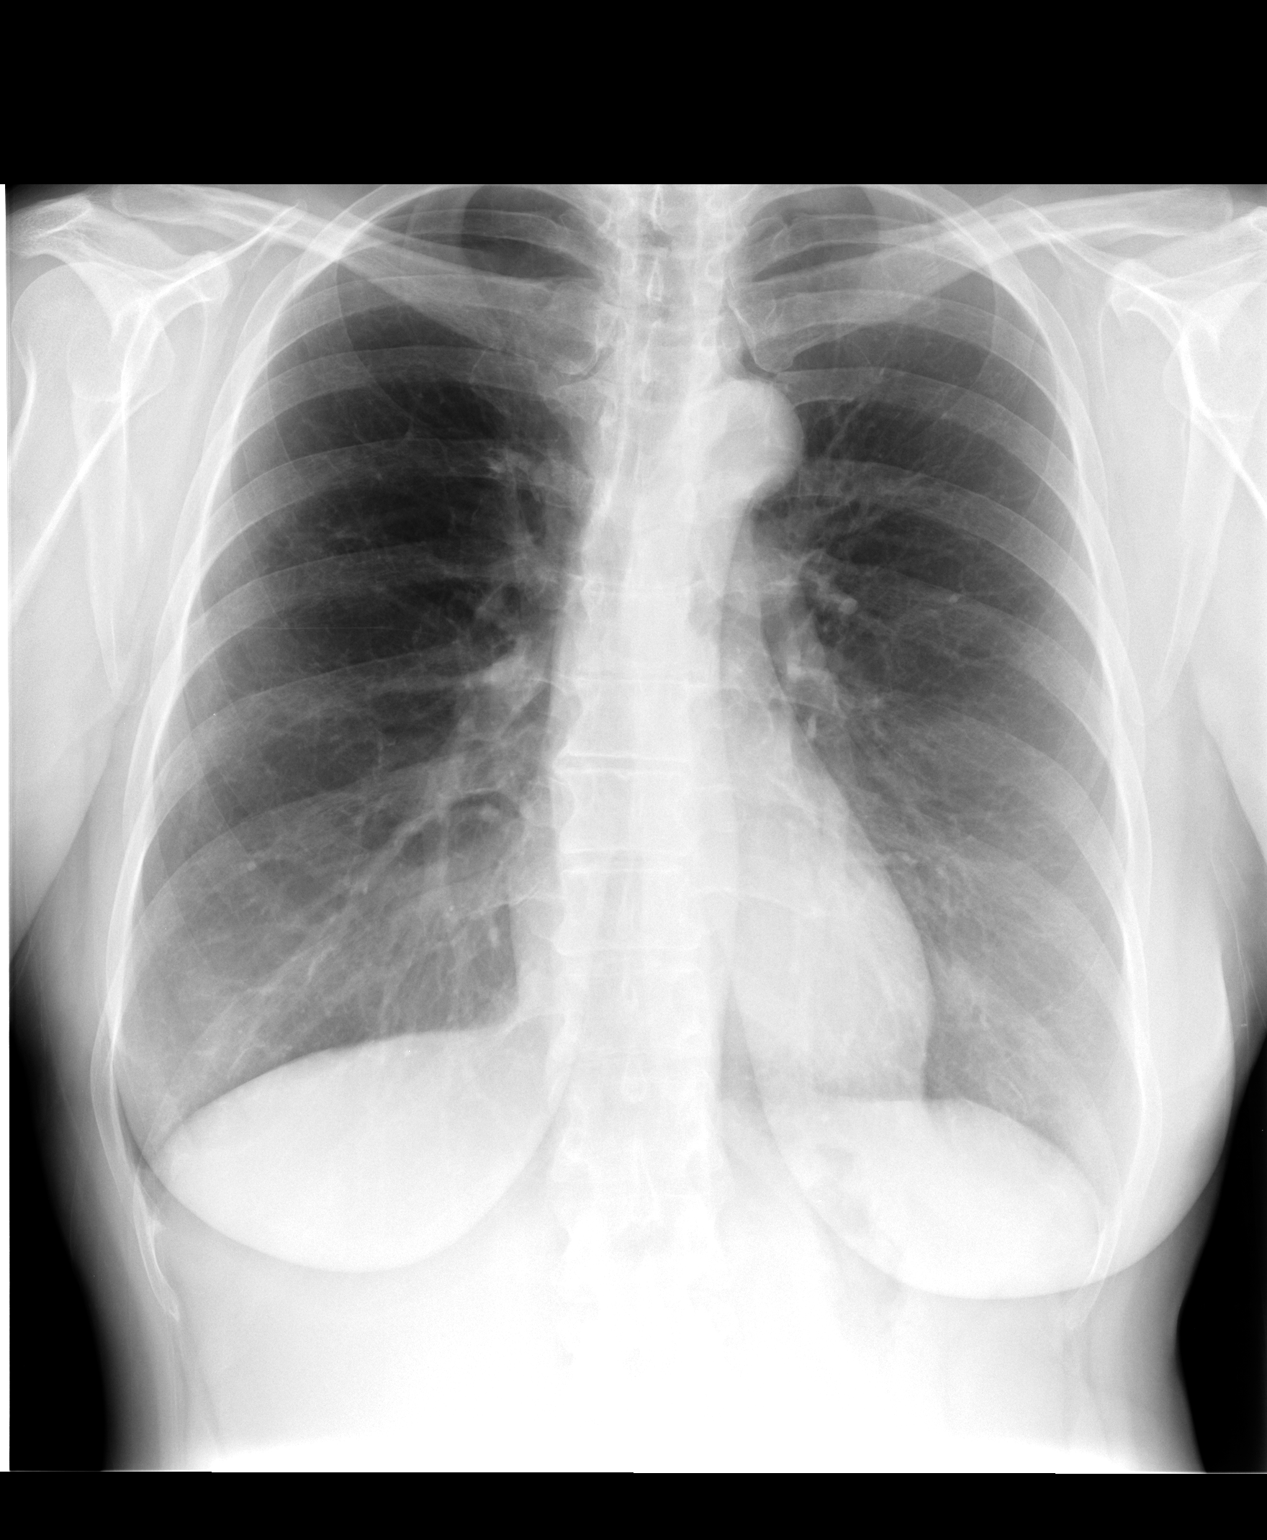

[view not recorded (2 of 2)]
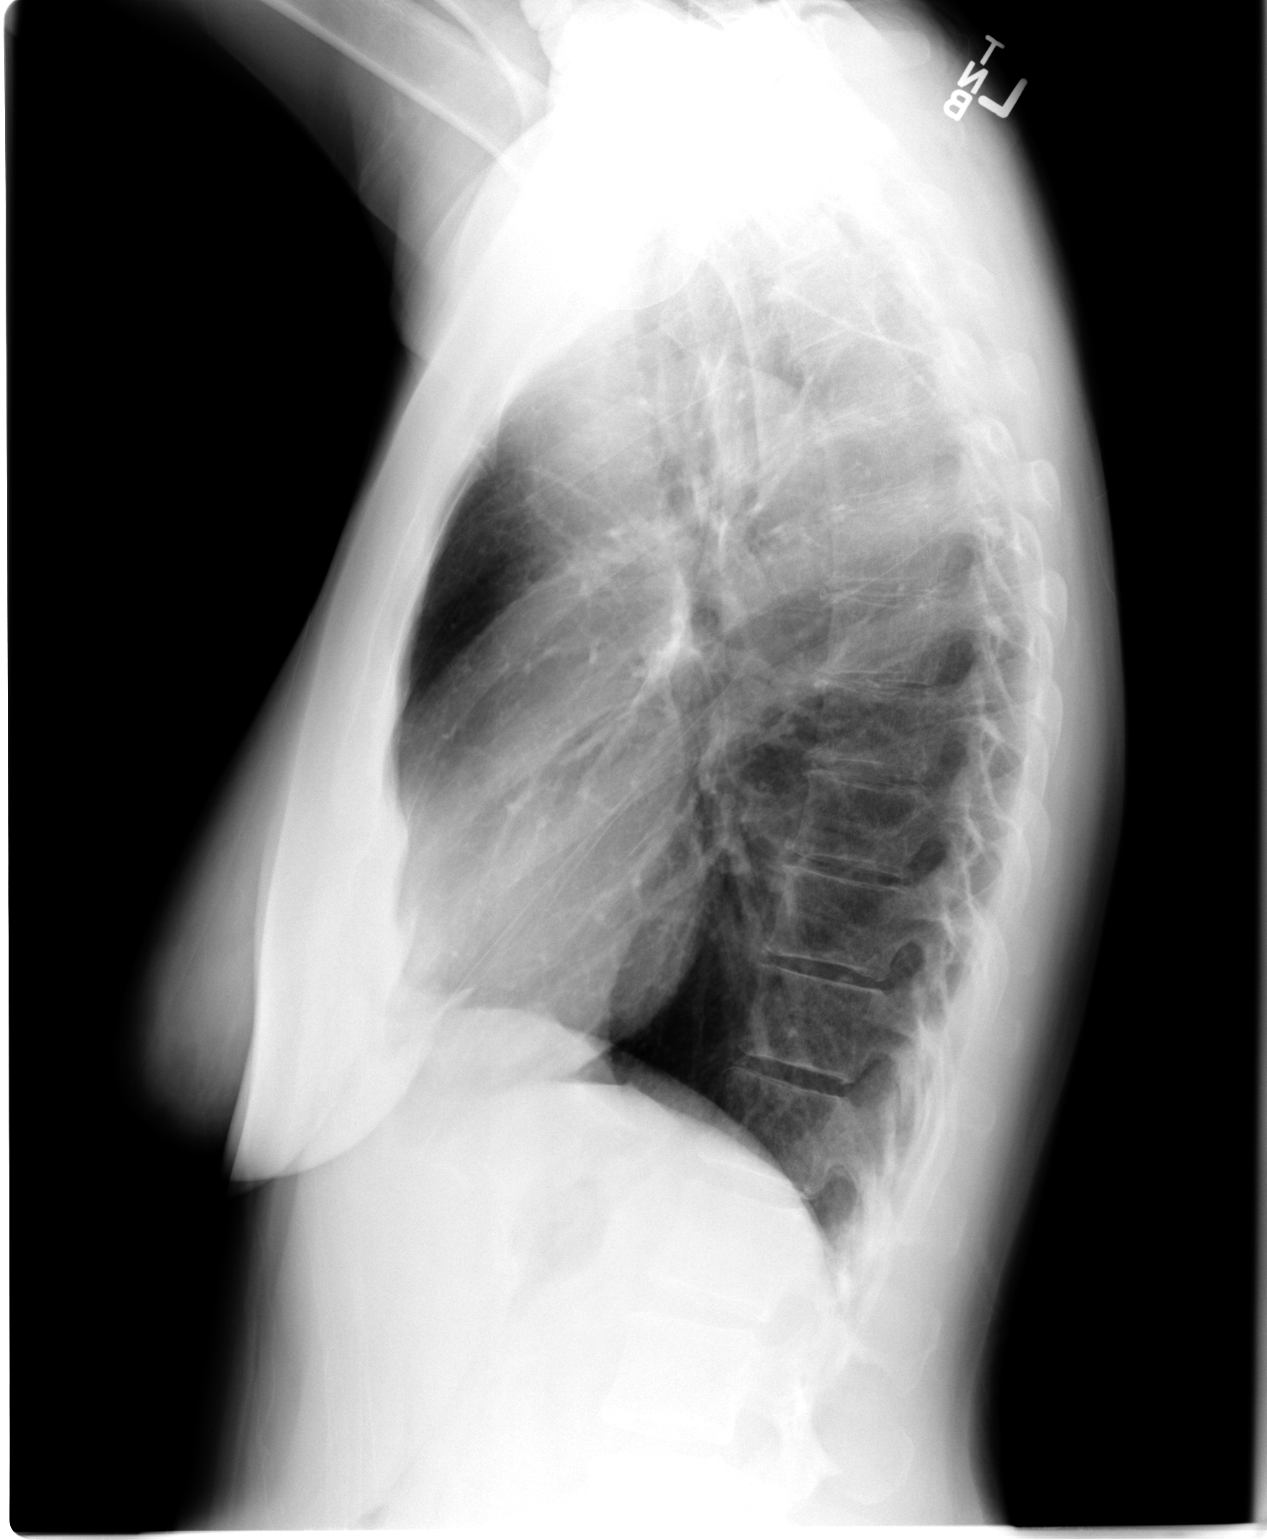

[2 of 2 positions shown; findings below may reference images not displayed]

FINDINGS: The heart size and mediastinal contours are within normal limits.
Both lungs are clear. The visualized skeletal structures are
unremarkable.
IMPRESSION: No active cardiopulmonary disease.

## 2016-07-01 ENCOUNTER — Other Ambulatory Visit: Payer: Self-pay | Admitting: Obstetrics & Gynecology

## 2016-07-01 DIAGNOSIS — Z1231 Encounter for screening mammogram for malignant neoplasm of breast: Secondary | ICD-10-CM

## 2016-07-18 ENCOUNTER — Ambulatory Visit (INDEPENDENT_AMBULATORY_CARE_PROVIDER_SITE_OTHER): Payer: BLUE CROSS/BLUE SHIELD | Admitting: Family Medicine

## 2016-07-18 ENCOUNTER — Ambulatory Visit
Admission: RE | Admit: 2016-07-18 | Discharge: 2016-07-18 | Disposition: A | Discharge status: Home / Self Care | Payer: BLUE CROSS/BLUE SHIELD | Source: Ambulatory Visit | Attending: Obstetrics & Gynecology | Admitting: Obstetrics & Gynecology

## 2016-07-18 ENCOUNTER — Encounter: Payer: Self-pay | Admitting: Family Medicine

## 2016-07-18 VITALS — BP 138/80 | HR 76 | Temp 97.4°F | Resp 16 | Ht 64.0 in | Wt 131.1 lb

## 2016-07-18 DIAGNOSIS — Z720 Tobacco use: Secondary | ICD-10-CM

## 2016-07-18 DIAGNOSIS — F411 Generalized anxiety disorder: Secondary | ICD-10-CM | POA: Diagnosis not present

## 2016-07-18 DIAGNOSIS — Z1231 Encounter for screening mammogram for malignant neoplasm of breast: Secondary | ICD-10-CM | POA: Diagnosis not present

## 2016-07-18 DIAGNOSIS — Z7689 Persons encountering health services in other specified circumstances: Secondary | ICD-10-CM | POA: Diagnosis not present

## 2016-07-18 HISTORY — DX: Tobacco use: Z72.0

## 2016-07-18 LAB — COMPREHENSIVE METABOLIC PANEL
ALBUMIN: 4.3 g/dL (ref 3.6–5.1)
ALT: 10 U/L (ref 6–29)
AST: 12 U/L (ref 10–35)
Alkaline Phosphatase: 77 U/L (ref 33–130)
BILIRUBIN TOTAL: 0.4 mg/dL (ref 0.2–1.2)
BUN: 13 mg/dL (ref 7–25)
CALCIUM: 9.1 mg/dL (ref 8.6–10.4)
CHLORIDE: 105 mmol/L (ref 98–110)
CO2: 28 mmol/L (ref 20–31)
CREATININE: 0.59 mg/dL (ref 0.50–1.05)
Glucose, Bld: 83 mg/dL (ref 65–99)
Potassium: 3.9 mmol/L (ref 3.5–5.3)
SODIUM: 140 mmol/L (ref 135–146)
TOTAL PROTEIN: 6.8 g/dL (ref 6.1–8.1)

## 2016-07-18 LAB — LIPID PANEL
CHOLESTEROL: 178 mg/dL (ref <200)
HDL: 51 mg/dL (ref >50)
LDL Cholesterol: 90 mg/dL (ref <100)
TRIGLYCERIDES: 183 mg/dL — AB (ref <150)
Total CHOL/HDL Ratio: 3.5 Ratio (ref <5.0)
VLDL: 37 mg/dL — AB (ref <30)

## 2016-07-18 LAB — CBC
HEMATOCRIT: 38.9 % (ref 35.0–45.0)
HEMOGLOBIN: 13.2 g/dL (ref 11.7–15.5)
MCH: 30.8 pg (ref 27.0–33.0)
MCHC: 33.9 g/dL (ref 32.0–36.0)
MCV: 90.9 fL (ref 80.0–100.0)
MPV: 8.9 fL (ref 7.5–12.5)
Platelets: 276 10*3/uL (ref 140–400)
RBC: 4.28 MIL/uL (ref 3.80–5.10)
RDW: 13.3 % (ref 11.0–15.0)
WBC: 6.2 10*3/uL (ref 3.8–10.8)

## 2016-07-18 LAB — URINALYSIS, ROUTINE W REFLEX MICROSCOPIC
Bilirubin Urine: NEGATIVE
GLUCOSE, UA: NEGATIVE
Hgb urine dipstick: NEGATIVE
Ketones, ur: NEGATIVE
LEUKOCYTES UA: NEGATIVE
NITRITE: NEGATIVE
PH: 6.5 (ref 5.0–8.0)
Protein, ur: NEGATIVE
SPECIFIC GRAVITY, URINE: 1.01 (ref 1.001–1.035)

## 2016-07-18 MED ORDER — ESCITALOPRAM OXALATE 10 MG PO TABS: 10.0000 mg | ORAL_TABLET | Freq: Every day | ORAL | 1 refills | Status: DC

## 2016-07-18 MED ORDER — VARENICLINE TARTRATE 0.5 MG X 11 & 1 MG X 42 PO MISC: ORAL | 0 refills | Status: DC

## 2016-07-18 NOTE — Patient Instructions (Signed)
Use the chantix as directed to quit smoking  Take the lexapro one a day for one week Then increase to 20 mg a day ( two pills)  Come back in one month  Need lab tests I will send you a letter with your test results.  If there is anything of concern, we will call right away.

## 2016-07-18 NOTE — Progress Notes (Signed)
Chief Complaint  Patient presents with  . Establish Care   UP TO DATE WITH HEALTH MAINTENANCE PAP, Mammo, colonoscopy all recent Didn't get flu shot this year, but usu does Eats well and walks almost every day  I have discussed the multiple health risks associated with cigarette smoking including, but not limited to, cardiovascular disease, lung disease and cancer.  I have strongly recommended that smoking be stopped.  I have reviewed the various methods of quitting including cold [place], classes, nicotine replacements and prescription medications.  I have offered assistance in this difficult process.  She successfully used chantix in the past and would like to try again to quit.  Her only complaint is anxiety.  Upset easily and cries easily.  Used to take paxil, but weaned self off years ago when felt stable.  Feels like she may need med again.  Lives alone.  No pets.  Two grown daughters near by with 6 grandchildren.  No thoughts of harming self.  WEight stable.  Sleep I s OK.  regular eye visits, regular dentist visits, sees GYN yearly    Patient Active Problem List   Diagnosis Date Noted  . Tobacco abuse 07/18/2016  . GAD (generalized anxiety disorder) 07/18/2016  . Depression 04/05/2014  . Bilateral low back pain with right-sided sciatica 04/05/2014    Outpatient Encounter Prescriptions as of 07/18/2016  Medication Sig  . co-enzyme Q-10 30 MG capsule Take 30 mg by mouth 3 (three) times daily.  [name] ibuprofen (ADVIL,MOTRIN) 200 MG tablet Take 400 mg by mouth every 6 (six) hours as needed. For pain  . Multiple Vitamin (MULTIVITAMIN) tablet Take 1 tablet by mouth daily.  [name] escitalopram (LEXAPRO) 10 MG tablet Take 1 tablet (10 mg total) by mouth daily. After one week increase to 2 a day  . varenicline (CHANTIX STARTING MONTH PAK) 0.5 MG X 11 & 1 MG X 42 tablet Take one 0.5 mg tablet by mouth once daily for 3 days, then increase to one 0.5 mg tablet twice daily for 4 days, then increase  to one 1 mg tablet twice daily.   No facility-administered encounter medications on file as of 07/18/2016.     Past Medical History:  Diagnosis Date  . Allergy   . Anemia   . Anxiety   . Arthritis    lower back  . Depression   . Hyperlipidemia   . No pertinent past medical history   . Tobacco abuse 07/18/2016    Past Surgical History:  Procedure Laterality Date  . COLONOSCOPY  01/10/2012   Procedure: COLONOSCOPY;  Surgeon: Dalia Heading, MD;  Location: [facility];  Service: Gastroenterology;  Laterality: N/A;  . CYST EXCISION Right    hand  . DILATION AND CURETTAGE OF UTERUS     Ablation  . FOOT SURGERY Left   . RT HAND SURGERY    . TONSILLECTOMY      Social History   Social History  . Marital status: Divorced    Spouse name: N/A  . Number of children: 2  . 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 Smoking status: Current Every Day Smoker    Packs/day: 0.50    Types: Cigarettes    Start date: 05/09/1984  . Smokeless tobacco: Never Used  . Alcohol use 0.0 oz/week     Comment: once every few months  . Drug use: No  .  Sexual activity: Yes    Birth control/ protection: Surgical   Other Topics Concern  . Not on file   Social History Narrative   Lives alone   Two grown daughters   Significant friend - 1 1/2 years   Loves to walk    Family History  Problem Relation Age of Onset  . Hypertension Mother   . Diabetes Mother   . Heart attack Mother [id number]  . Heart disease Mother   . Kidney disease Mother   . Hyperlipidemia Mother   . Lymphoma Father     non-hodgkins  . Cancer Father   . Arthritis Father   . Hyperlipidemia Father     Review of Systems  Constitutional: Negative for chills, fever and weight loss.  HENT: Negative for congestion and hearing loss.   Eyes: Negative for blurred vision and pain.  Respiratory: Negative for cough and shortness of breath.   Cardiovascular:  Negative for chest pain and leg swelling.  Gastrointestinal: Negative for abdominal pain, constipation, diarrhea and heartburn.  Genitourinary: Negative for dysuria and frequency.  Musculoskeletal: Negative for falls, joint pain and myalgias.  Neurological: Negative for dizziness, seizures and headaches.  Psychiatric/Behavioral: Positive for depression. Negative for substance abuse and suicidal ideas. The patient is nervous/anxious. The patient does not have insomnia.     BP 138/80   Pulse 76   Temp 97.4 F (36.3 C) (Temporal)   Resp 16   Ht 5\' 4"  (1.626 m)   Wt 131 lb 1.9 oz (59.5 kg)   LMP 07/18/2004 (Approximate)   SpO2 100%   BMI 22.51 kg/m   Physical Exam  Constitutional: She is oriented to person, place, and time. She appears well-developed and well-nourished.  HENT:  Head: Normocephalic and atraumatic.  Right Ear: External ear normal.  Left Ear: External ear normal.  Mouth/Throat: Oropharynx is clear and moist.  Eyes: Conjunctivae are normal. Pupils are equal, round, and reactive to light.  Neck: Normal range of motion. Neck supple. No thyromegaly present.  Cardiovascular: Normal rate, regular rhythm and normal heart sounds.   Pulmonary/Chest: Effort normal and breath sounds normal. No respiratory distress.  Abdominal: Soft. Bowel sounds are normal.  Musculoskeletal: Normal range of motion. She exhibits no edema.  Lymphadenopathy:    She has no cervical adenopathy.  Neurological: She is alert and oriented to person, place, and time.  Gait normal  Skin: Skin is warm and dry.  Psychiatric: She has a normal mood and affect. Her behavior is normal. Thought content normal.  Nursing note and vitals reviewed. ASSESSMENT/PLAN:   1. Tobacco abuse   2. GAD (generalized anxiety disorder)  - CBC - Comprehensive metabolic panel - Lipid panel - VITAMIN D 25 Hydroxy (Vit-D Deficiency, Fractures) - Urinalysis, Routine w reflex microscopic  3. Encounter to establish care  with new doctor  Discussed anxiety and depression.  SSRI medicines and serotonin.  Regular exercise and sleep.  Patient Instructions  Use the chantix as directed to quit smoking  Take the lexapro one a day for one week Then increase to 20 mg a day ( two pills)  Come back in one month  Need lab tests I will send you a letter with your test results.  If there is anything of concern, we will call right away.    Eustace MooreYvonne Sue Lavona Norsworthy, MD

## 2016-07-19 ENCOUNTER — Other Ambulatory Visit (HOSPITAL_COMMUNITY)
Admission: RE | Admit: 2016-07-19 | Discharge: 2016-07-19 | Disposition: A | Discharge status: Home / Self Care | Payer: BLUE CROSS/BLUE SHIELD | Source: Ambulatory Visit | Attending: Obstetrics & Gynecology | Admitting: Obstetrics & Gynecology

## 2016-07-19 ENCOUNTER — Ambulatory Visit (INDEPENDENT_AMBULATORY_CARE_PROVIDER_SITE_OTHER): Payer: BLUE CROSS/BLUE SHIELD | Admitting: Obstetrics & Gynecology

## 2016-07-19 ENCOUNTER — Encounter: Payer: Self-pay | Admitting: Family Medicine

## 2016-07-19 ENCOUNTER — Encounter: Payer: Self-pay | Admitting: Obstetrics & Gynecology

## 2016-07-19 VITALS — BP 130/90 | HR 78 | Ht 63.2 in | Wt 131.0 lb

## 2016-07-19 DIAGNOSIS — Z01419 Encounter for gynecological examination (general) (routine) without abnormal findings: Secondary | ICD-10-CM

## 2016-07-19 LAB — VITAMIN D 25 HYDROXY (VIT D DEFICIENCY, FRACTURES): Vit D, 25-Hydroxy: 22 ng/mL — ABNORMAL LOW (ref 30–100)

## 2016-07-19 NOTE — Progress Notes (Signed)
Subjective:     Anna [name] is a 51 y.o. female here for a routine exam.  No LMP recorded. Patient has had an ablation. No obstetric history on file. Birth Control Method:  Laparoscopic BTL + endometrial ablation Menstrual Calendar(currently): amenorrheic  Current complaints: none.   Current acute medical issues:  none   Recent Gynecologic History No LMP recorded. Patient has had an ablation. Last Pap: 2017,  normal Last mammogram: yesterday,  normal  Past Medical History:  Diagnosis Date  . Allergy   . Anemia   . Anxiety   . Arthritis    lower back  . Depression   . Hyperlipidemia   . No pertinent past medical history   . Tobacco abuse 07/18/2016    Past Surgical History:  Procedure Laterality Date  . COLONOSCOPY  01/10/2012   Procedure: COLONOSCOPY;  Surgeon: Dalia Heading, MD;  Location: [facility];  Service: Gastroenterology;  Laterality: N/A;  . CYST EXCISION Right    hand  . DILATION AND CURETTAGE OF UTERUS     Ablation  . FOOT SURGERY Left   . RT HAND SURGERY    . TONSILLECTOMY      OB History    No data available      Social History   Social History  . Marital status: Divorced    Spouse name: N/A  . Number of children: 2  . 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 Smoking status: Current Every Day Smoker    Packs/day: 0.50    Types: Cigarettes    Start date: 05/09/1984  . Smokeless tobacco: Never Used  . Alcohol use 0.0 oz/week     Comment: once every few months  . Drug use: No  . Sexual activity: Yes    Birth control/ protection: Surgical   Other Topics Concern  . None   Social History Narrative   Lives alone   Two grown daughters   Significant friend - 1 1/2 years   Loves to walk    Family History  Problem Relation Age of Onset  . Hypertension Mother   . Diabetes Mother   . Heart attack Mother 45  . Heart disease Mother   . Kidney disease Mother   .  Hyperlipidemia Mother   . Lymphoma Father     non-hodgkins  . Cancer Father   . Arthritis Father   . Hyperlipidemia Father      Current Outpatient Prescriptions:  .  co-enzyme Q-10 30 MG capsule, Take 30 mg by mouth 3 (three) times daily., Disp: , Rfl:  .  escitalopram (LEXAPRO) 10 MG tablet, Take 1 tablet (10 mg total) by mouth daily. After one week increase to 2 a day, Disp: 60 tablet, Rfl: 1 .  ibuprofen (ADVIL,MOTRIN) 200 MG tablet, Take 400 mg by mouth every 6 (six) hours as needed. For pain, Disp: , Rfl:  .  Multiple Vitamin (MULTIVITAMIN) tablet, Take 1 tablet by mouth daily., Disp: , Rfl:  .  varenicline (CHANTIX STARTING MONTH PAK) 0.5 MG X 11 & 1 MG X 42 tablet, Take one 0.5 mg tablet by mouth once daily for 3 days, then increase to one 0.5 mg tablet twice daily for 4 days, then increase to one 1 mg tablet twice daily. (Patient not taking: Reported on 07/19/2016), Disp: 53 tablet, Rfl: 0  Review of Systems  Review of Systems  Constitutional: Negative for fever, chills, weight loss, malaise/fatigue and diaphoresis.  HENT: Negative for hearing loss, ear pain, nosebleeds, congestion, sore throat, neck pain, tinnitus and ear discharge.   Eyes: Negative for blurred vision, double vision, photophobia, pain, discharge and redness.  Respiratory: Negative for cough, hemoptysis, sputum production, shortness of breath, wheezing and stridor.   Cardiovascular: Negative for chest pain, palpitations, orthopnea, claudication, leg swelling and PND.  Gastrointestinal: negative for abdominal pain. Negative for heartburn, nausea, vomiting, diarrhea, constipation, blood in stool and melena.  Genitourinary: Negative for dysuria, urgency, frequency, hematuria and flank pain.  Musculoskeletal: Negative for myalgias, back pain, joint pain and falls.  Skin: Negative for itching and rash.  Neurological: Negative for dizziness, tingling, tremors, sensory change, speech change, focal weakness, seizures, loss  of consciousness, weakness and headaches.  Endo/Heme/Allergies: Negative for environmental allergies and polydipsia. Does not bruise/bleed easily.  Psychiatric/Behavioral: Negative for depression, suicidal ideas, hallucinations, memory loss and substance abuse. The patient is not nervous/anxious and does not have insomnia.        Objective:  Blood pressure 130/90, pulse 78, height 5' 3.2" (1.605 m), weight 131 lb (59.4 kg).   Physical Exam  Vitals reviewed. Constitutional: She is oriented to person, place, and time. She appears well-developed and well-nourished.  HENT:  Head: Normocephalic and atraumatic.        Right Ear: External ear normal.  Left Ear: External ear normal.  Nose: Nose normal.  Mouth/Throat: Oropharynx is clear and moist.  Eyes: Conjunctivae and EOM are normal. Pupils are equal, round, and reactive to light. Right eye exhibits no discharge. Left eye exhibits no discharge. No scleral icterus.  Neck: Normal range of motion. Neck supple. No tracheal deviation present. No thyromegaly present.  Cardiovascular: Normal rate, regular rhythm, normal heart sounds and intact distal pulses.  Exam reveals no gallop and no friction rub.   No murmur heard. Respiratory: Effort normal and breath sounds normal. No respiratory distress. She has no wheezes. She has no rales. She exhibits no tenderness.  GI: Soft. Bowel sounds are normal. She exhibits no distension and no mass. There is no tenderness. There is no rebound and no guarding.  Genitourinary:  Breasts no masses skin changes or nipple changes bilaterally      Vulva is normal without lesions Vagina is pink moist without discharge Cervix normal in appearance and pap is done Uterus is normal size shape and contour Adnexa is negative with normal sized ovaries   Musculoskeletal: Normal range of motion. She exhibits no edema and no tenderness.  Neurological: She is alert and oriented to person, place, and time. She has normal  reflexes. She displays normal reflexes. No cranial nerve deficit. She exhibits normal muscle tone. Coordination normal.  Skin: Skin is warm and dry. No rash noted. No erythema. No pallor.  Psychiatric: She has a normal mood and affect. Her behavior is normal. Judgment and thought content normal.       Medications Ordered at today's visit: No orders of the defined types were placed in this encounter.   Other orders placed at today's visit: No orders of the defined types were placed in this encounter.     Assessment:    Healthy female exam.    Plan:    Mammogram ordered. Follow up in: 2 years.     Return in about 2 years (around 07/20/2018) for yearly, with Dr Despina HiddenEure.

## 2016-07-21 LAB — CYTOLOGY - PAP: Diagnosis: NEGATIVE

## 2016-08-23 ENCOUNTER — Encounter: Payer: Self-pay | Admitting: Family Medicine

## 2016-08-23 ENCOUNTER — Ambulatory Visit (INDEPENDENT_AMBULATORY_CARE_PROVIDER_SITE_OTHER): Payer: BLUE CROSS/BLUE SHIELD | Admitting: Family Medicine

## 2016-08-23 VITALS — BP 134/80 | HR 84 | Temp 96.6°F | Resp 16 | Ht 63.0 in | Wt 135.1 lb

## 2016-08-23 DIAGNOSIS — M25542 Pain in joints of left hand: Secondary | ICD-10-CM | POA: Diagnosis not present

## 2016-08-23 DIAGNOSIS — M25541 Pain in joints of right hand: Secondary | ICD-10-CM | POA: Diagnosis not present

## 2016-08-23 DIAGNOSIS — Z0000 Encounter for general adult medical examination without abnormal findings: Secondary | ICD-10-CM | POA: Diagnosis not present

## 2016-08-23 MED ORDER — VARENICLINE TARTRATE 1 MG PO TABS: 1.0000 mg | ORAL_TABLET | Freq: Two times a day (BID) | ORAL | 1 refills | Status: DC

## 2016-08-23 MED ORDER — MELOXICAM 15 MG PO TABS: 15.0000 mg | ORAL_TABLET | Freq: Every day | ORAL | 0 refills | Status: DC

## 2016-08-23 NOTE — Patient Instructions (Signed)
Continue to eat well and stay active Take the meloxicam once a day with food You may take Tylenol, but do not take ibuprofen while on meloxicam Call if you have heartburn or side effects Use Chantix for 2-3 months, until your sure you have quit smoking See me for check ups once a year Call sooner for any health concerns

## 2016-08-23 NOTE — Progress Notes (Signed)
Chief Complaint  Patient presents with  . Annual Exam   Since she was seen last Anna [name] has stopped smoking. She is congratulated on her effort. Chantix is working well for her. She is having no side effects. I also prescribed for her Lexapro. She states that it gave her "a headache that lasted a week". She states "I threw it in the trash". She is managing her anxiety without medicine and does not want to try an alternative at this time. Her blood work was performed and reviewed with her. Everything was normal or as expected. She has a mildly low vitamin D and is taking a vitamin D supplement. She states many years ago she was diagnosed with rheumatoid arthritis. She states she took medicine for a year. She quit taking the medication and has had little joint pain. For the last month she's had an increase in her hand joint pain. She describes her MTP joints. They hurt in the morning. They're very stiff and sore. She has not seen any redness or swelling. No other joint involvement, rash, or symptoms.   Patient Active Problem List   Diagnosis Date Noted  . Tobacco abuse 07/18/2016  . GAD (generalized anxiety disorder) 07/18/2016  . Depression 04/05/2014  . Bilateral low back pain with right-sided sciatica 04/05/2014    Outpatient Encounter Prescriptions as of 08/23/2016  Medication Sig  . co-enzyme Q-10 30 MG capsule Take 30 mg by mouth 3 (three) times daily.  [name] ibuprofen (ADVIL,MOTRIN) 200 MG tablet Take 400 mg by mouth every 6 (six) hours as needed. For pain  . Multiple Vitamin (MULTIVITAMIN) tablet Take 1 tablet by mouth daily.  . varenicline (CHANTIX STARTING MONTH PAK) 0.5 MG X 11 & 1 MG X 42 tablet Take one 0.5 mg tablet by mouth once daily for 3 days, then increase to one 0.5 mg tablet twice daily for 4 days, then increase to one 1 mg tablet twice daily.  . meloxicam (MOBIC) 15 MG tablet Take 1 tablet (15 mg total) by mouth daily.  . varenicline (CHANTIX CONTINUING MONTH PAK) 1 MG  tablet Take 1 tablet (1 mg total) by mouth 2 (two) times daily.   No facility-administered encounter medications on file as of 08/23/2016.     Allergies  Allergen Reactions  . Penicillins Other (See Comments)    Unknown childhood reaction   . Wellbutrin [Bupropion] Itching    Review of Systems  Constitutional: Negative for activity change, appetite change and unexpected weight change.  HENT: Negative for congestion, dental problem, postnasal drip and rhinorrhea.   Eyes: Negative for redness and visual disturbance.  Respiratory: Negative for cough and shortness of breath.   Cardiovascular: Negative for chest pain, palpitations and leg swelling.  Gastrointestinal: Negative for abdominal pain, constipation and diarrhea.  Genitourinary: Negative for difficulty urinating, frequency and menstrual problem.  Musculoskeletal: Positive for arthralgias. Negative for back pain.  Neurological: Negative for dizziness and headaches.  Psychiatric/Behavioral: Negative for dysphoric mood and sleep disturbance. The patient is not nervous/anxious.     BP 134/80 (BP Location: Right Arm, Patient Position: Sitting, Cuff Size: Normal)   Pulse 84   Temp (!) 96.6 F (35.9 C) (Temporal)   Resp 16   Ht  (1.6 m)   Wt 135 lb 1.3 oz (61.3 kg)   SpO2 97%   BMI 23.93 kg/m   Physical Exam BP 134/80 (BP Location: Right Arm, Patient Position: Sitting, Cuff Size: Normal)   Pulse 84   Temp [name])  96.6 F (35.9 C) (Temporal)   Resp 16   Ht  (1.6 m)   Wt 135 lb 1.3 oz (61.3 kg)   SpO2 97%   BMI 23.93 kg/m   General Appearance:    Alert, cooperative, no distress, appears stated age  Head:    Normocephalic, without obvious abnormality, atraumatic  Eyes:    PERRL, conjunctiva/corneas clear, EOM's intact, fundi    benign, both eyes  Ears:    Normal TM's and external ear canals, both ears  Nose:   Nares normal, septum midline, mucosa normal, no drainage    or sinus tenderness  Throat:   Lips,  mucosa, and tongue normal; teeth and gums normal  Neck:   Supple, symmetrical, trachea midline, no adenopathy;    thyroid:  no enlargement/tenderness/nodules; no carotid   bruit   Back:     Symmetric, no curvature, ROM normal, no CVA tenderness  Lungs:     Clear to auscultation bilaterally, respirations unlabored  Chest Wall:    No tenderness or deformity   Heart:    Regular rate and rhythm, S1 and S2 normal, no murmur, rub   or gallop  Breast Exam:    No tenderness, masses, or nipple abnormality  Abdomen:     Soft, non-tender, bowel sounds active all four quadrants,    no masses, no organomegaly  Extremities:   Extremities normal, atraumatic, no cyanosis or edema  Pulses:   2+ and symmetric all extremities  Skin:   Skin color, texture, turgor normal, no rashes or lesions  Lymph nodes:   Cervical, supraclavicular, and axillary nodes normal  Neurologic:   CNII-XII intact, normal strength, sensation and reflexes    throughout    ASSESSMENT/PLAN:  1. Annual physical exam Normal  2. Arthralgia of both hands Past history of possible rheumatoid arthritis. She'll try a long-acting anti-inflammatory, and if her symptoms persist she may need rheumatology consult, rheumatoid factor and inflammatory marker blood work   Patient Instructions  Continue to eat well and stay active Take the meloxicam once a day with food You may take Tylenol, but do not take ibuprofen while on meloxicam Call if you have heartburn or side effects Use Chantix for 2-3 months, until your sure you have quit smoking See me for check ups once a year Call sooner for any health concerns   Eustace Moore, MD

## 2016-10-10 ENCOUNTER — Telehealth: Payer: Self-pay | Admitting: Family Medicine

## 2016-10-10 NOTE — Telephone Encounter (Signed)
Patient came to say that she is having positive results with the Meloxicam and would like another Rx.  She is completely out.  [facility] in @ [facility] .

## 2016-10-11 MED ORDER — MELOXICAM 15 MG PO TABS: 15.0000 mg | ORAL_TABLET | Freq: Every day | ORAL | 3 refills | Status: DC

## 2016-11-10 ENCOUNTER — Ambulatory Visit (INDEPENDENT_AMBULATORY_CARE_PROVIDER_SITE_OTHER): Payer: BLUE CROSS/BLUE SHIELD | Admitting: Family Medicine

## 2016-11-10 ENCOUNTER — Encounter: Payer: Self-pay | Admitting: Family Medicine

## 2016-11-10 VITALS — BP 132/78 | HR 88 | Temp 98.0°F | Resp 16 | Ht 63.0 in | Wt 139.0 lb

## 2016-11-10 DIAGNOSIS — M48061 Spinal stenosis, lumbar region without neurogenic claudication: Secondary | ICD-10-CM | POA: Diagnosis not present

## 2016-11-10 MED ORDER — GABAPENTIN 300 MG PO CAPS: 300.0000 mg | ORAL_CAPSULE | Freq: Every day | ORAL | 3 refills | Status: DC

## 2016-11-10 MED ORDER — METHYLPREDNISOLONE 4 MG PO TBPK: ORAL_TABLET | ORAL | 0 refills | Status: DC

## 2016-11-10 MED ORDER — GABAPENTIN 300 MG PO CAPS: 300.0000 mg | ORAL_CAPSULE | Freq: Three times a day (TID) | ORAL | 3 refills | Status: DC

## 2016-11-10 NOTE — Progress Notes (Signed)
Chief Complaint  Patient presents with  . Back Pain  Patient has known degenerative disc disease. She has known spinal stenosis. She had an episode of back pain in 2015 treated conservatively. She got better with medication and physical therapy. She did have an MRI at that time. She also has a low-grade spondylolisthesis at L5/S1 Ever since then she has been good about doing her back exercises. She tries to stay flexible. She walks regularly. She watches her weight. She takes rare over-the-counter anti-inflammatories. She does her prescription for Mobic for back is bothering her. 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 This probably helps keep her strong, but also puts her at risk for recurring back pain. There is not any work incident or accident. No change in activity. For the last week she's had increased pain. In addition she's had tingling into both legs. The bottom of her right foot feels numb. On a couple of occasions with sudden movements she felt like she was going to collapse to low back pain. No problems with bowels. No incontinence of bladder. Her regular medicines are helping. She is continuing to work. She has asked her coworkers to do some of the heavy work for her.    Patient Active Problem List   Diagnosis Date Noted  . Tobacco abuse 07/18/2016  . GAD (generalized anxiety disorder) 07/18/2016  . Depression 04/05/2014  . Bilateral low back pain with right-sided sciatica 04/05/2014    Outpatient Encounter Prescriptions as of 11/10/2016  Medication Sig  . co-enzyme Q-10 30 MG capsule Take 30 mg by mouth 3 (three) times daily.  [name] ibuprofen (ADVIL,MOTRIN) 200 MG tablet Take 400 mg by mouth every 6 (six) hours as needed. For pain  . meloxicam (MOBIC) 15 MG tablet Take 1 tablet (15 mg total) by mouth daily.  . Multiple Vitamin (MULTIVITAMIN) tablet Take 1 tablet by mouth daily.  [name] gabapentin (NEURONTIN) 300 MG capsule Take 1  capsule (300 mg total) by mouth at bedtime.  . methylPREDNISolone (MEDROL DOSEPAK) 4 MG TBPK tablet tad   No facility-administered encounter medications on file as of 11/10/2016.     Allergies  Allergen Reactions  . Penicillins Other (See Comments)    Unknown childhood reaction   . Wellbutrin [Bupropion] Itching    Review of Systems  Constitutional: Positive for unexpected weight change. Negative for activity change and appetite change.       States is slowly gaining weight  HENT: Negative for congestion, dental problem, postnasal drip and rhinorrhea.   Eyes: Negative for redness and visual disturbance.  Respiratory: Negative for cough and shortness of breath.   Cardiovascular: Negative for chest pain, palpitations and leg swelling.  Gastrointestinal: Negative for abdominal pain, constipation and diarrhea.  Genitourinary: Negative for difficulty urinating, frequency and menstrual problem.  Musculoskeletal: Positive for back pain and gait problem. Negative for arthralgias.  Neurological: Positive for weakness and numbness. Negative for dizziness and headaches.  Psychiatric/Behavioral: Positive for sleep disturbance. Negative for dysphoric mood. The patient is not nervous/anxious.     BP 132/78 (BP Location: Right Arm, Patient Position: Sitting, Cuff Size: Normal)   Pulse 88   Temp 98 F (36.7 C) (Temporal)   Resp 16   Ht 5\' 3"  (1.6 m)   Wt 139 lb 0.6 oz (63.1 kg)   SpO2 99%   BMI 24.63 kg/m   Physical Exam  Constitutional: She appears well-developed and well-nourished. No distress.  HENT:  Head: Normocephalic and atraumatic.  Cardiovascular: Normal rate, regular rhythm and normal heart sounds.   Pulmonary/Chest: Effort normal and breath sounds normal.  Abdominal: Soft. Bowel sounds are normal.  Musculoskeletal:  Lumbar spine is straight and symmetric. Full range of motion. No tenderness or muscle spasm. Strength, sensation, range of motion, and reflexes are normal in both  lower extremities. Straight leg raise is negative bilateral. She can flex fingertips to the floor. She can stand on heels and toes.   Neurological: She displays normal reflexes. Coordination normal.  Skin: Skin is warm and dry. No rash noted.  Psychiatric: She has a normal mood and affect. Her behavior is normal.    ASSESSMENT/PLAN:  1. Spinal stenosis at L4-L5 level Discussed conservative management of chronic low back pain. Hopefully this is a flare up of inflammation of the nerve roots from her spinal stenosis, and she will respond to medication. She is not interested in stronger pain medication. She'll continue to use acetaminophen for pain, and do a Medrol Dosepak. With her prior episode of back pain steroids did help. I'm also giving her gabapentin to help her sleep at night. She understands that if she fails conservative therapy over the next 4-6 weeks that she might need another course of physical therapy, might need another MRI or referral to back specialist.  Patient Instructions  HOLD the mobic while on the the medrol Take the medrol pak as directed Take all of day one today Take the gabapentin an hour before bed This is for nerve irritation  Continue your exercises Continue to manage your work lifting  Call if not better in a week or two    Eustace MooreYvonne Sue Melysa Schroyer, MD

## 2016-11-10 NOTE — Patient Instructions (Signed)
HOLD the mobic while on the the medrol Take the medrol pak as directed Take all of day one today Take the gabapentin an hour before bed This is for nerve irritation  Continue your exercises Continue to manage your work lifting  Call if not better in a week or two

## 2016-11-24 ENCOUNTER — Encounter: Payer: Self-pay | Admitting: Family Medicine

## 2016-11-24 ENCOUNTER — Ambulatory Visit (INDEPENDENT_AMBULATORY_CARE_PROVIDER_SITE_OTHER): Payer: BLUE CROSS/BLUE SHIELD | Admitting: Family Medicine

## 2016-11-24 VITALS — BP 138/96 | HR 76 | Temp 96.8°F | Resp 16 | Ht 63.0 in | Wt 142.1 lb

## 2016-11-24 DIAGNOSIS — M48061 Spinal stenosis, lumbar region without neurogenic claudication: Secondary | ICD-10-CM

## 2016-11-24 DIAGNOSIS — M5116 Intervertebral disc disorders with radiculopathy, lumbar region: Secondary | ICD-10-CM | POA: Diagnosis not present

## 2016-11-24 NOTE — Progress Notes (Signed)
Chief Complaint  Patient presents with  . Back Pain   Follow up back pain still with numbness and tingling in legs L>R Is doing home PT exercises Is working,  But not lifting Did not see ANY improvement from medrol The gabapentin helps a little, easier getting up in the morning, but makes her drowsy Says when she is tired she "drags her leg" on the R and limps  Patient Active Problem List   Diagnosis Date Noted  . Tobacco abuse 07/18/2016  . GAD (generalized anxiety disorder) 07/18/2016  . Depression 04/05/2014  . Bilateral low back pain with right-sided sciatica 04/05/2014    Outpatient Encounter Prescriptions as of 11/24/2016  Medication Sig  . co-enzyme Q-10 30 MG capsule Take 30 mg by mouth 3 (three) times daily.  [name] gabapentin (NEURONTIN) 300 MG capsule Take 1 capsule (300 mg total) by mouth at bedtime.  [name] ibuprofen (ADVIL,MOTRIN) 200 MG tablet Take 400 mg by mouth every 6 (six) hours as needed. For pain  . loratadine (CLARITIN) 10 MG tablet Take 10 mg by mouth daily.  . Multiple Vitamin (MULTIVITAMIN) tablet Take 1 tablet by mouth daily.  . meloxicam (MOBIC) 15 MG tablet Take 1 tablet (15 mg total) by mouth daily. (Patient not taking: Reported on 11/24/2016)  . [DISCONTINUED] methylPREDNISolone (MEDROL DOSEPAK) 4 MG TBPK tablet tad (Patient not taking: Reported on 11/24/2016)   No facility-administered encounter medications on file as of 11/24/2016.     Allergies  Allergen Reactions  . Penicillins Other (See Comments)    Unknown childhood reaction   . Wellbutrin [Bupropion] Itching    Review of Systems  Constitutional: Negative for activity change, appetite change and unexpected weight change.       States is slowly gaining weight  HENT: Negative for congestion, dental problem, postnasal drip and rhinorrhea.   Eyes: Negative for redness and visual disturbance.  Respiratory: Negative for cough and shortness of breath.   Cardiovascular: Negative for chest pain,  palpitations and leg swelling.  Gastrointestinal: Negative for abdominal pain, constipation and diarrhea.  Genitourinary: Negative for difficulty urinating, frequency and menstrual problem.  Musculoskeletal: Positive for back pain and gait problem. Negative for arthralgias.  Neurological: Positive for weakness and numbness. Negative for dizziness and headaches.  Psychiatric/Behavioral: Negative for dysphoric mood and sleep disturbance. The patient is not nervous/anxious.     BP (!) 138/96 (BP Location: Right Arm, Patient Position: Sitting, Cuff Size: Normal)   Pulse 76   Temp (!) 96.8 F (36 C) (Temporal)   Resp 16   Ht 5\' 3"  (1.6 m)   Wt 142 lb 1.9 oz (64.5 kg)   SpO2 98%   BMI 25.18 kg/m   Physical Exam  Constitutional: She is oriented to person, place, and time. She appears well-developed and well-nourished. No distress.  HENT:  Head: Normocephalic and atraumatic.  Mouth/Throat: Oropharynx is clear and moist.  Eyes: Pupils are equal, round, and reactive to light. Conjunctivae are normal.  Neck: Normal range of motion. Neck supple. No thyromegaly present.  Cardiovascular: Normal rate, regular rhythm and normal heart sounds.   Pulmonary/Chest: Effort normal and breath sounds normal. No respiratory distress.  Abdominal: Soft. Bowel sounds are normal.  Musculoskeletal: Normal range of motion. She exhibits no edema.  Lumbar spine is straight and symmetric. Full range of motion. No tenderness or muscle spasm. Strength, sensation, range of motion, and reflexes are normal in both lower extremities. Straight leg raise is negative bilateral. She can flex fingertips to the  floor. She can stand on heels and toes.  moves slowly - uncomfortable   Lymphadenopathy:    She has no cervical adenopathy.  Neurological: She is alert and oriented to person, place, and time. She displays normal reflexes. Coordination normal.  Gait slightly antalgic   Skin: Skin is warm and dry. No rash noted.    Psychiatric: She has a normal mood and affect. Her behavior is normal. Thought content normal.  Nursing note and vitals reviewed.   ASSESSMENT/PLAN:  1. Spinal stenosis at L4-L5 level  - Ambulatory referral to Spine Surgery  2. Lumbar disc disease with radiculopathy  - Ambulatory referral to Spine Surgery   Patient Instructions  Continue to limit bending and lifting at work Do your daily stretching exercise Walk daily to tolerance Take the gabapentin for nerve pain If it is causing too much drowsiness, may reduce the dose Take the mobic once a day Add tylenol if needed pain  I am referring you to a spine specialist  Call for problems   Eustace MooreYvonne Sue Juvenal Umar, MD

## 2016-11-24 NOTE — Patient Instructions (Signed)
Continue to limit bending and lifting at work Do your daily stretching exercise Walk daily to tolerance Take the gabapentin for nerve pain If it is causing too much drowsiness, may reduce the dose Take the mobic once a day Add tylenol if needed pain  I am referring you to a spine specialist  Call for problems

## 2017-01-04 DIAGNOSIS — M4310 Spondylolisthesis, site unspecified: Secondary | ICD-10-CM | POA: Diagnosis not present

## 2017-02-09 DIAGNOSIS — M4310 Spondylolisthesis, site unspecified: Secondary | ICD-10-CM | POA: Diagnosis not present

## 2017-02-09 DIAGNOSIS — M545 Low back pain: Secondary | ICD-10-CM | POA: Diagnosis not present

## 2017-02-09 DIAGNOSIS — R030 Elevated blood-pressure reading, without diagnosis of hypertension: Secondary | ICD-10-CM | POA: Diagnosis not present

## 2017-02-15 DIAGNOSIS — M4310 Spondylolisthesis, site unspecified: Secondary | ICD-10-CM | POA: Diagnosis not present

## 2017-02-15 DIAGNOSIS — Z01812 Encounter for preprocedural laboratory examination: Secondary | ICD-10-CM | POA: Diagnosis not present

## 2017-02-20 DIAGNOSIS — M4316 Spondylolisthesis, lumbar region: Secondary | ICD-10-CM | POA: Diagnosis not present

## 2017-02-21 DIAGNOSIS — M4316 Spondylolisthesis, lumbar region: Secondary | ICD-10-CM | POA: Diagnosis not present

## 2017-02-21 DIAGNOSIS — M48062 Spinal stenosis, lumbar region with neurogenic claudication: Secondary | ICD-10-CM | POA: Diagnosis not present

## 2017-03-28 DIAGNOSIS — M4310 Spondylolisthesis, site unspecified: Secondary | ICD-10-CM | POA: Diagnosis not present

## 2017-04-26 DIAGNOSIS — M4310 Spondylolisthesis, site unspecified: Secondary | ICD-10-CM | POA: Diagnosis not present

## 2017-05-25 DIAGNOSIS — M4310 Spondylolisthesis, site unspecified: Secondary | ICD-10-CM | POA: Diagnosis not present

## 2017-07-17 ENCOUNTER — Encounter: Payer: Self-pay | Admitting: Family Medicine

## 2017-08-15 ENCOUNTER — Other Ambulatory Visit: Payer: Self-pay | Admitting: Obstetrics & Gynecology

## 2017-08-15 DIAGNOSIS — Z1231 Encounter for screening mammogram for malignant neoplasm of breast: Secondary | ICD-10-CM

## 2017-08-28 DIAGNOSIS — E782 Mixed hyperlipidemia: Secondary | ICD-10-CM | POA: Diagnosis not present

## 2017-08-28 DIAGNOSIS — M79673 Pain in unspecified foot: Secondary | ICD-10-CM | POA: Diagnosis not present

## 2017-08-28 DIAGNOSIS — Z6824 Body mass index (BMI) 24.0-24.9, adult: Secondary | ICD-10-CM | POA: Diagnosis not present

## 2017-08-28 DIAGNOSIS — R030 Elevated blood-pressure reading, without diagnosis of hypertension: Secondary | ICD-10-CM | POA: Diagnosis not present

## 2017-09-04 DIAGNOSIS — R030 Elevated blood-pressure reading, without diagnosis of hypertension: Secondary | ICD-10-CM | POA: Diagnosis not present

## 2017-09-04 DIAGNOSIS — E782 Mixed hyperlipidemia: Secondary | ICD-10-CM | POA: Diagnosis not present

## 2017-09-07 DIAGNOSIS — Z6824 Body mass index (BMI) 24.0-24.9, adult: Secondary | ICD-10-CM | POA: Diagnosis not present

## 2017-09-07 DIAGNOSIS — M79673 Pain in unspecified foot: Secondary | ICD-10-CM | POA: Diagnosis not present

## 2017-09-07 DIAGNOSIS — E785 Hyperlipidemia, unspecified: Secondary | ICD-10-CM | POA: Diagnosis not present

## 2017-09-07 DIAGNOSIS — L2089 Other atopic dermatitis: Secondary | ICD-10-CM | POA: Diagnosis not present

## 2017-09-11 ENCOUNTER — Ambulatory Visit
Admission: RE | Admit: 2017-09-11 | Discharge: 2017-09-11 | Disposition: A | Discharge status: Home / Self Care | Payer: BLUE CROSS/BLUE SHIELD | Source: Ambulatory Visit | Attending: Obstetrics & Gynecology | Admitting: Obstetrics & Gynecology

## 2017-09-11 DIAGNOSIS — Z1231 Encounter for screening mammogram for malignant neoplasm of breast: Secondary | ICD-10-CM

## 2017-09-12 ENCOUNTER — Other Ambulatory Visit (HOSPITAL_COMMUNITY)
Admission: RE | Admit: 2017-09-12 | Discharge: 2017-09-12 | Disposition: A | Discharge status: Home / Self Care | Payer: BLUE CROSS/BLUE SHIELD | Source: Ambulatory Visit | Attending: Obstetrics & Gynecology | Admitting: Obstetrics & Gynecology

## 2017-09-12 ENCOUNTER — Ambulatory Visit (INDEPENDENT_AMBULATORY_CARE_PROVIDER_SITE_OTHER): Payer: BLUE CROSS/BLUE SHIELD | Admitting: Obstetrics & Gynecology

## 2017-09-12 ENCOUNTER — Encounter: Payer: Self-pay | Admitting: Obstetrics & Gynecology

## 2017-09-12 VITALS — BP 120/80 | HR 96 | Ht 63.0 in | Wt 140.2 lb

## 2017-09-12 DIAGNOSIS — Z1211 Encounter for screening for malignant neoplasm of colon: Secondary | ICD-10-CM | POA: Diagnosis not present

## 2017-09-12 DIAGNOSIS — Z01419 Encounter for gynecological examination (general) (routine) without abnormal findings: Secondary | ICD-10-CM

## 2017-09-12 DIAGNOSIS — Z01411 Encounter for gynecological examination (general) (routine) with abnormal findings: Secondary | ICD-10-CM

## 2017-09-12 DIAGNOSIS — N941 Unspecified dyspareunia: Secondary | ICD-10-CM | POA: Diagnosis not present

## 2017-09-12 DIAGNOSIS — Z1212 Encounter for screening for malignant neoplasm of rectum: Secondary | ICD-10-CM

## 2017-09-12 MED ORDER — TRIAMTERENE-HCTZ 37.5-25 MG PO CAPS: 1.0000 | ORAL_CAPSULE | Freq: Every day | ORAL | 3 refills | Status: DC

## 2017-09-12 MED ORDER — ESTRADIOL 10 MCG VA INST: 10.0000 ug | VAGINAL_INSERT | Freq: Every day | VAGINAL | 11 refills | Status: DC

## 2017-09-12 NOTE — Progress Notes (Signed)
Subjective:     Anna [name] is a 52 y.o. female here for a routine exam.  No LMP recorded. Patient has had an ablation. G2P0 Birth Control Method:  menopause Menstrual Calendar(currently): none  Current complaints: some dyspareunia.   Current acute medical issues:  Back surgery   Recent Gynecologic History No LMP recorded. Patient has had an ablation. Last Pap: 2018,  normal Last mammogram: today,  normal  Past Medical History:  Diagnosis Date  . Allergy   . Anemia   . Anxiety   . Arthritis    lower back  . Bilateral low back pain with right-sided sciatica   . Depression   . Hyperlipidemia   . No pertinent past medical history   . Tobacco abuse 07/18/2016    Past Surgical History:  Procedure Laterality Date  . COLONOSCOPY  01/10/2012   Procedure: COLONOSCOPY;  Surgeon: Dalia Heading, MD;  Location: [facility];  Service: Gastroenterology;  Laterality: N/A;  . CYST EXCISION Right    hand  . DILATION AND CURETTAGE OF UTERUS     Ablation  . FOOT SURGERY Left   . RT HAND SURGERY    . SPINAL FUSION    . TONSILLECTOMY      OB History    Gravida  2   Para      Term      Preterm      AB      Living  2     SAB      TAB      Ectopic      Multiple      Live Births              Social History   Socioeconomic History  . Marital status: Divorced    Spouse name: Not on file  . Number of children: 2  . Years of education: 43  . Highest education level: Not on file  Occupational History  . Occupation: team leader    Comment: [employer]  Social Needs  . Financial resource strain: Not on file  . Food insecurity:    Worry: Not on file    Inability: Not on file  . Transportation needs:    Medical: Not on file    Non-medical: Not on file  Tobacco Use  . Smoking status: Former Smoker    Packs/day: 0.50    Types: Cigarettes    Start date: 05/09/1984    Last attempt to quit: 08/09/2016    Years since quitting: 1.0  . Smokeless tobacco: Never  Used  Substance and Sexual Activity  . Alcohol use: Yes    Alcohol/week: 0.0 oz    Comment: once every few months  . Drug use: No  . Sexual activity: Yes    Birth control/protection: Surgical  Lifestyle  . Physical activity:    Days per week: Not on file    Minutes per session: Not on file  . Stress: Not on file  Relationships  . Social connections:    Talks on phone: Not on file    Gets together: Not on file    Attends religious service: Not on file    Active member of club or organization: Not on file    Attends meetings of clubs or organizations: Not on file    Relationship status: Not on file  Other Topics Concern  . Not on file  Social History Narrative   Lives alone   Two grown daughters  Significant friend - 1 1/2 years   Loves to walk    Family History  Problem Relation Age of Onset  . Hypertension Mother   . Diabetes Mother   . Heart attack Mother 1  . Heart disease Mother   . Kidney disease Mother   . Hyperlipidemia Mother   . Lymphoma Father        non-hodgkins  . Cancer Father   . Arthritis Father   . Hyperlipidemia Father   . Breast cancer Cousin        in 53's  . Breast cancer Cousin        in 60's     Current Outpatient Medications:  .  ibuprofen (ADVIL,MOTRIN) 200 MG tablet, Take 400 mg by mouth every 6 (six) hours as needed. For pain, Disp: , Rfl:  .  loratadine (CLARITIN) 10 MG tablet, Take 10 mg by mouth daily., Disp: , Rfl:  .  Multiple Vitamin (MULTIVITAMIN) tablet, Take 1 tablet by mouth daily., Disp: , Rfl:  .  co-enzyme Q-10 30 MG capsule, Take 30 mg by mouth 3 (three) times daily., Disp: , Rfl:  .  Estradiol (IMVEXXY MAINTENANCE PACK) 10 MCG INST, Place 10 mcg vaginally at bedtime., Disp: 30 each, Rfl: 11 .  gabapentin (NEURONTIN) 300 MG capsule, Take 1 capsule (300 mg total) by mouth at bedtime. (Patient not taking: Reported on 09/12/2017), Disp: 30 capsule, Rfl: 3 .  meloxicam (MOBIC) 15 MG tablet, Take 1 tablet (15 mg total) by  mouth daily. (Patient taking differently: Take 7.5 mg by mouth daily. ), Disp: 30 tablet, Rfl: 0 .  triamterene-hydrochlorothiazide (DYAZIDE) 37.5-25 MG capsule, Take 1 each (1 capsule total) by mouth daily., Disp: 30 capsule, Rfl: 3  Review of Systems  Review of Systems  Constitutional: Negative for fever, chills, weight loss, malaise/fatigue and diaphoresis.  HENT: Negative for hearing loss, ear pain, nosebleeds, congestion, sore throat, neck pain, tinnitus and ear discharge.   Eyes: Negative for blurred vision, double vision, photophobia, pain, discharge and redness.  Respiratory: Negative for cough, hemoptysis, sputum production, shortness of breath, wheezing and stridor.   Cardiovascular: Negative for chest pain, palpitations, orthopnea, claudication, leg swelling and PND.  Gastrointestinal: negative for abdominal pain. Negative for heartburn, nausea, vomiting, diarrhea, constipation, blood in stool and melena.  Genitourinary: Negative for dysuria, urgency, frequency, hematuria and flank pain.  Musculoskeletal: Negative for myalgias, back pain, joint pain and falls.  Skin: Negative for itching and rash.  Neurological: Negative for dizziness, tingling, tremors, sensory change, speech change, focal weakness, seizures, loss of consciousness, weakness and headaches.  Endo/Heme/Allergies: Negative for environmental allergies and polydipsia. Does not bruise/bleed easily.  Psychiatric/Behavioral: Negative for depression, suicidal ideas, hallucinations, memory loss and substance abuse. The patient is not nervous/anxious and does not have insomnia.        Objective:  Blood pressure 120/80, pulse 96, height  (1.6 m), weight 140 lb 3.2 oz (63.6 kg).   Physical Exam  Vitals reviewed. Constitutional: She is oriented to person, place, and time. She appears well-developed and well-nourished.  HENT:  Head: Normocephalic and atraumatic.        Right Ear: External ear normal.  Left Ear: External  ear normal.  Nose: Nose normal.  Mouth/Throat: Oropharynx is clear and moist.  Eyes: Conjunctivae and EOM are normal. Pupils are equal, round, and reactive to light. Right eye exhibits no discharge. Left eye exhibits no discharge. No scleral icterus.  Neck: Normal range of motion. Neck supple. No tracheal deviation  present. No thyromegaly present.  Cardiovascular: Normal rate, regular rhythm, normal heart sounds and intact distal pulses.  Exam reveals no gallop and no friction rub.   No murmur heard. Respiratory: Effort normal and breath sounds normal. No respiratory distress. She has no wheezes. She has no rales. She exhibits no tenderness.  GI: Soft. Bowel sounds are normal. She exhibits no distension and no mass. There is no tenderness. There is no rebound and no guarding.  Genitourinary:  Breasts no masses skin changes or nipple changes bilaterally      Vulva is normal without lesions Vagina is pink moist without discharge Cervix normal in appearance and pap is done Uterus is normal size shape and contour Adnexa is negative with normal sized ovaries  {Rectal    hemoccult negative, normal tone, no masses  Musculoskeletal: Normal range of motion. She exhibits no edema and no tenderness.  Neurological: She is alert and oriented to person, place, and time. She has normal reflexes. She displays normal reflexes. No cranial nerve deficit. She exhibits normal muscle tone. Coordination normal.  Skin: Skin is warm and dry. No rash noted. No erythema. No pallor.  Psychiatric: She has a normal mood and affect. Her behavior is normal. Judgment and thought content normal.       Medications Ordered at today's visit: Meds ordered this encounter  Medications  . triamterene-hydrochlorothiazide (DYAZIDE) 37.5-25 MG capsule    Sig: Take 1 each (1 capsule total) by mouth daily.    Dispense:  30 capsule    Refill:  3  . Estradiol (IMVEXXY MAINTENANCE PACK) 10 MCG INST    Sig: Place 10 mcg vaginally at  bedtime.    Dispense:  30 each    Refill:  11    Other orders placed at today's visit: No orders of the defined types were placed in this encounter.     Assessment:    Healthy female exam.    Plan:    Mammogram ordered. Follow up in: 2 years.    Dyazide for episodic swelling  Return in about 2 years (around 09/13/2019) for yearly, with Dr Despina Hidden.

## 2017-09-14 LAB — CYTOLOGY - PAP
DIAGNOSIS: NEGATIVE
HPV (WINDOPATH): NOT DETECTED

## 2017-11-10 ENCOUNTER — Emergency Department (HOSPITAL_COMMUNITY): Payer: BLUE CROSS/BLUE SHIELD

## 2017-11-10 ENCOUNTER — Encounter (HOSPITAL_COMMUNITY): Payer: Self-pay | Admitting: Emergency Medicine

## 2017-11-10 ENCOUNTER — Emergency Department (HOSPITAL_COMMUNITY)
Admission: EM | Admit: 2017-11-10 | Discharge: 2017-11-10 | Disposition: A | Discharge status: Home / Self Care | Payer: BLUE CROSS/BLUE SHIELD | Attending: Emergency Medicine | Admitting: Emergency Medicine

## 2017-11-10 ENCOUNTER — Other Ambulatory Visit: Payer: Self-pay

## 2017-11-10 DIAGNOSIS — Z87891 Personal history of nicotine dependence: Secondary | ICD-10-CM | POA: Diagnosis not present

## 2017-11-10 DIAGNOSIS — Z79899 Other long term (current) drug therapy: Secondary | ICD-10-CM | POA: Diagnosis not present

## 2017-11-10 DIAGNOSIS — R200 Anesthesia of skin: Secondary | ICD-10-CM | POA: Diagnosis not present

## 2017-11-10 DIAGNOSIS — I10 Essential (primary) hypertension: Secondary | ICD-10-CM | POA: Insufficient documentation

## 2017-11-10 DIAGNOSIS — R202 Paresthesia of skin: Secondary | ICD-10-CM | POA: Diagnosis not present

## 2017-11-10 DIAGNOSIS — E785 Hyperlipidemia, unspecified: Secondary | ICD-10-CM | POA: Insufficient documentation

## 2017-11-10 LAB — COMPREHENSIVE METABOLIC PANEL
ALK PHOS: 77 U/L (ref 38–126)
ALT: 13 U/L (ref 0–44)
ANION GAP: 6 (ref 5–15)
AST: 18 U/L (ref 15–41)
Albumin: 4.5 g/dL (ref 3.5–5.0)
BILIRUBIN TOTAL: 0.5 mg/dL (ref 0.3–1.2)
BUN: 18 mg/dL (ref 6–20)
CALCIUM: 9.7 mg/dL (ref 8.9–10.3)
CO2: 27 mmol/L (ref 22–32)
Chloride: 110 mmol/L (ref 98–111)
Creatinine, Ser: 0.65 mg/dL (ref 0.44–1.00)
GFR calc Af Amer: >60 mL/min (ref >60)
GFR calc non Af Amer: >60 mL/min (ref >60)
Glucose, Bld: 90 mg/dL (ref 70–99)
Potassium: 3.7 mmol/L (ref 3.5–5.1)
Sodium: 143 mmol/L (ref 135–145)
TOTAL PROTEIN: 7.6 g/dL (ref 6.5–8.1)

## 2017-11-10 LAB — I-STAT BETA HCG BLOOD, ED (MC, WL, AP ONLY)

## 2017-11-10 LAB — URINALYSIS, ROUTINE W REFLEX MICROSCOPIC
BILIRUBIN URINE: NEGATIVE
Glucose, UA: NEGATIVE mg/dL
HGB URINE DIPSTICK: NEGATIVE
Ketones, ur: NEGATIVE mg/dL
NITRITE: NEGATIVE
Protein, ur: NEGATIVE mg/dL
SPECIFIC GRAVITY, URINE: 1.016 (ref 1.005–1.030)
pH: 7 (ref 5.0–8.0)

## 2017-11-10 LAB — RAPID URINE DRUG SCREEN, HOSP PERFORMED
Amphetamines: NOT DETECTED
Benzodiazepines: NOT DETECTED
Cocaine: NOT DETECTED
OPIATES: NOT DETECTED
TETRAHYDROCANNABINOL: NOT DETECTED

## 2017-11-10 LAB — PROTIME-INR
INR: 0.92
PROTHROMBIN TIME: 12.3 s (ref 11.4–15.2)

## 2017-11-10 LAB — DIFFERENTIAL
Basophils Absolute: 0.1 10*3/uL (ref 0.0–0.1)
Basophils Relative: 1 %
EOS PCT: 3 %
Eosinophils Absolute: 0.2 10*3/uL (ref 0.0–0.7)
LYMPHS ABS: 2 10*3/uL (ref 0.7–4.0)
LYMPHS PCT: 27 %
MONOS PCT: 5 %
Monocytes Absolute: 0.3 10*3/uL (ref 0.1–1.0)
Neutro Abs: 4.8 10*3/uL (ref 1.7–7.7)
Neutrophils Relative %: 64 %

## 2017-11-10 LAB — I-STAT TROPONIN, ED: Troponin i, poc: 0 ng/mL (ref 0.00–0.08)

## 2017-11-10 LAB — CBC
HEMATOCRIT: 39 % (ref 36.0–46.0)
Hemoglobin: 13.1 g/dL (ref 12.0–15.0)
MCH: 29.9 pg (ref 26.0–34.0)
MCHC: 33.6 g/dL (ref 30.0–36.0)
MCV: 89 fL (ref 78.0–100.0)
Platelets: 263 10*3/uL (ref 150–400)
RBC: 4.38 MIL/uL (ref 3.87–5.11)
RDW: 13.1 % (ref 11.5–15.5)
WBC: 7.3 10*3/uL (ref 4.0–10.5)

## 2017-11-10 LAB — APTT: aPTT: 27 seconds (ref 24–36)

## 2017-11-10 LAB — ETHANOL: Alcohol, Ethyl (B): <10 mg/dL (ref <10)

## 2017-11-10 NOTE — ED Notes (Signed)
Pt transferred to MRI

## 2017-11-10 NOTE — ED Notes (Signed)
Patient transported to MRI, unable to obtain vitals at the moment.

## 2017-11-10 NOTE — Discharge Instructions (Addendum)
As we discussed recommend following up with neurology.  Your MRI was negative.  Also recommend following up with your regular doctor for blood pressure rechecks.  In general your blood pressure was fine here today.  Return for any new or worse symptoms.

## 2017-11-10 NOTE — ED Notes (Signed)
Knapp MD aware of pt status and complaint and verbalizes will come see in triage 1.

## 2017-11-10 NOTE — ED Triage Notes (Addendum)
Pt verbalizes right lip and right hand numbness/tingling noted 0600 today; noted BP to be 150s/120s. Denies weakness.

## 2017-11-10 NOTE — ED Provider Notes (Signed)
MSE was initiated and I personally evaluated the patient and placed orders (if any) at  10:57 AM on November 10, 2017.  Pt presented with numbness and tingling on the right side of her face.  She then noticed numbness on the right finger.  She felt fine when she first woke up.    Pt has normal sensation on her face, arms, legs.  Normal strength.   Will proceed with eval.  No code stroke at this time without any appreciable deficits on exam.   Linwood DibblesKnapp, Johnny Gorter, MD 11/10/17 1100

## 2017-11-10 NOTE — ED Provider Notes (Signed)
[facility]-EMERGENCY DEPT Provider Note   CSN: [id number] Arrival date & time: 11/10/17  1037     History   Chief Complaint Chief Complaint  Patient presents with  . Numbness  . Hypertension    HPI Anna StallSusan M [name] is a 52 y.o. female.  Patient with acute onset this morning at about 6 AM of tingling to the right side of her face and right thumb and index finger.  Symptoms kind of got worse on the right side of the face.  Patient went to work.  At work her blood pressure was checked and it was noted to be 150/120 and she was told to come in.  Upon arrival here systolic pressures have been in the 120s.  Patient does not have a history of hypertension.  No headache.  No other symptoms.  Patient not lightheaded patient did not feel like she was going to pass out.     Past Medical History:  Diagnosis Date  . Allergy   . Anemia   . Anxiety   . Arthritis    lower back  . Bilateral low back pain with right-sided sciatica   . Depression   . Hyperlipidemia   . No pertinent past medical history   . Tobacco abuse 07/18/2016    Patient Active Problem List   Diagnosis Date Noted  . Tobacco abuse 07/18/2016  . GAD (generalized anxiety disorder) 07/18/2016  . Depression 04/05/2014  . Bilateral low back pain with right-sided sciatica 04/05/2014    Past Surgical History:  Procedure Laterality Date  . COLONOSCOPY  01/10/2012   Procedure: COLONOSCOPY;  Surgeon: Dalia HeadingMark A Jenkins, MD;  Location: [facility];  Service: Gastroenterology;  Laterality: N/A;  . CYST EXCISION Right    hand  . DILATION AND CURETTAGE OF UTERUS     Ablation  . FOOT SURGERY Left   . RT HAND SURGERY    . SPINAL FUSION    . TONSILLECTOMY       OB History    Gravida  2   Para      Term      Preterm      AB      Living  2     SAB      TAB      Ectopic      Multiple      Live Births               Home Medications    Prior to Admission medications   Medication Sig  Start Date End Date Taking? Authorizing Provider  ibuprofen (ADVIL,MOTRIN) 200 MG tablet Take 400 mg by mouth every 6 (six) hours as needed. For pain   Yes [provider]  Boris LownKrill Oil 1000 MG CAPS Take 1,000 mg by mouth daily.   Yes [provider]  loratadine (CLARITIN) 10 MG tablet Take 10 mg by mouth daily.   Yes [provider]  meloxicam (MOBIC) 7.5 MG tablet Take 1 tablet by mouth daily. 10/23/17  Yes [provider]  triamterene-hydrochlorothiazide (DYAZIDE) 37.5-25 MG capsule Take 1 each (1 capsule total) by mouth daily. 09/12/17  Yes Eure, Amaryllis DykeLuther H, MD  CHANTIX CONTINUING MONTH PAK 1 MG tablet Take 1 tablet by mouth 2 (two) times daily. 10/23/17   [provider]  Estradiol (IMVEXXY MAINTENANCE PACK) 10 MCG INST Place 10 mcg vaginally at bedtime. 09/12/17   Lazaro ArmsEure, Luther H, MD  gabapentin (NEURONTIN) 300 MG capsule Take 1 capsule (300  mg total) by mouth at bedtime. Patient not taking: Reported on 09/12/2017 11/10/16   Eustace Moore, MD  meloxicam (MOBIC) 15 MG tablet Take 1 tablet (15 mg total) by mouth daily. Patient not taking: Reported on 11/10/2017 08/23/16   Eustace Moore, MD    Family History Family History  Problem Relation Age of Onset  . Hypertension Mother   . Diabetes Mother   . Heart attack Mother 18  . Heart disease Mother   . Kidney disease Mother   . Hyperlipidemia Mother   . Lymphoma Father        non-hodgkins  . Cancer Father   . Arthritis Father   . Hyperlipidemia Father   . Breast cancer Cousin        in 85's  . Breast cancer Cousin        in 30's    Social History Social History   Tobacco Use  . Smoking status: Former Smoker    Packs/day: 0.50    Types: Cigarettes    Start date: 05/09/1984    Last attempt to quit: 08/09/2016    Years since quitting: 1.2  . Smokeless tobacco: Never Used  Substance Use Topics  . Alcohol use: Yes    Alcohol/week: 0.0 oz    Comment: once every few months  . Drug use: No      Allergies   Penicillins and Wellbutrin [bupropion]   Review of Systems Review of Systems  Constitutional: Negative for fever.  HENT: Negative for congestion and facial swelling.   Eyes: Negative for visual disturbance.  Respiratory: Negative for shortness of breath.   Cardiovascular: Negative for chest pain.  Gastrointestinal: Negative for abdominal pain.  Genitourinary: Negative for dysuria.  Musculoskeletal: Negative for back pain.  Skin: Negative for rash.  Neurological: Positive for numbness.  Hematological: Does not bruise/bleed easily.  Psychiatric/Behavioral: Negative for confusion.     Physical Exam Updated Vital Signs BP (!) 148/87   Pulse 68   Temp 99.4 F (37.4 C)   Resp 13   Ht 1.6 m (5\' 3" )   Wt 59.8 kg (131 lb 12.8 oz)   SpO2 99%   BMI 23.35 kg/m   Physical Exam  Constitutional: She is oriented to person, place, and time. She appears well-developed and well-nourished. No distress.  HENT:  Head: Normocephalic and atraumatic.  Mouth/Throat: Oropharynx is clear and moist.  Eyes: Pupils are equal, round, and reactive to light. Conjunctivae and EOM are normal.  Neck: Normal range of motion. Neck supple.  Cardiovascular: Normal rate, regular rhythm and normal heart sounds.  Pulmonary/Chest: Effort normal and breath sounds normal.  Abdominal: Soft. Bowel sounds are normal. There is no tenderness.  Musculoskeletal: Normal range of motion.  Right hand with good strength good cap refill radial pulse 2+.  Neurological: She is alert and oriented to person, place, and time. No cranial nerve deficit or sensory deficit. She exhibits normal muscle tone. Coordination normal.  Patient without any facial weakness.  Right side of face appears a little puffy no rash or erythema.  Skin: Skin is warm. No rash noted.  Nursing note and vitals reviewed.    ED Treatments / Results  Labs (all labs ordered are listed, but only abnormal results are displayed) Labs  Reviewed  RAPID URINE DRUG SCREEN, HOSP PERFORMED - Abnormal; Notable for the following components:      Result Value   Barbiturates   (*)    Value: Result not available. Reagent lot number recalled by  manufacturer.   All other components within normal limits  URINALYSIS, ROUTINE W REFLEX MICROSCOPIC - Abnormal; Notable for the following components:   Leukocytes, UA MODERATE (*)    Bacteria, UA RARE (*)    All other components within normal limits  ETHANOL  PROTIME-INR  APTT  CBC  DIFFERENTIAL  COMPREHENSIVE METABOLIC PANEL  I-STAT TROPONIN, ED  I-STAT BETA HCG BLOOD, ED ([facility], [facility], [facility] ONLY)    EKG EKG Interpretation  Date/Time:  Friday November 10 2017 10:52:19 EDT Ventricular Rate:  88 PR Interval:    QRS Duration: 90 QT Interval:  362 QTC Calculation: 438 R Axis:   -61 Text Interpretation:  Sinus rhythm Biatrial enlargement Left anterior fascicular block Low voltage, precordial leads No significant change since last tracing Confirmed by Linwood Dibbles [phone]) on 11/10/2017 10:55:18 AM Also confirmed by Vanetta Mulders [phone])  on 11/10/2017 2:23:28 PM Also confirmed by Vanetta Mulders [phone]), editor Sheppard Evens ([phone])  on 11/10/2017 3:10:34 PM   Radiology Ct Head Wo Contrast  Result Date: 11/10/2017 CLINICAL DATA:  Right-sided facial numbness. EXAM: CT HEAD WITHOUT CONTRAST TECHNIQUE: Contiguous axial images were obtained from the base of the skull through the vertex without intravenous contrast. COMPARISON:  None. FINDINGS: Brain: Mild diffuse cortical atrophy is noted. No mass effect or midline shift is noted. Ventricular size is within normal limits. There is no evidence of mass lesion, hemorrhage or acute infarction. Vascular: No hyperdense vessel or unexpected calcification. Skull: Normal. Negative for fracture or focal lesion. Sinuses/Orbits: Mild bilateral ethmoid sinusitis is noted. Other: None. IMPRESSION: Mild diffuse cortical atrophy. No acute intracranial abnormality seen.  Electronically Signed   By: Lupita Raider, M.D.   On: 11/10/2017 12:37   Mr Brain Wo Contrast (neuro Protocol)  Result Date: 11/10/2017 CLINICAL DATA:  Right lip and right hand numbness and tingling since this morning. EXAM: MRI HEAD WITHOUT CONTRAST TECHNIQUE: Multiplanar, multiecho pulse sequences of the brain and surrounding structures were obtained without intravenous contrast. COMPARISON:  Head CT from earlier today FINDINGS: Brain: No acute infarction, hemorrhage, hydrocephalus, extra-axial collection or mass lesion. No abnormality seen along the brainstem, cisterns, or skull base foramina. Vascular: Major flow voids are preserved. Tortuous right ICA at the skull base. Skull and upper cervical spine: No evidence of marrow lesion. Sinuses/Orbits: Moderate mucosal thickening in the ethmoid sinuses. Elsewhere mucosal thickening is mild and patchy. Negative orbits. Hypertrophic appearance of the lingual tonsil on sagittal T1 weighted imaging. IMPRESSION: No acute finding.  No explanation for symptoms. Electronically Signed   By: Marnee Spring M.D.   On: 11/10/2017 15:28    Procedures Procedures (including critical care time)  Medications Ordered in ED Medications - No data to display   Initial Impression / Assessment and Plan / ED Course  I have reviewed the triage vital signs and the nursing notes.  Pertinent labs & imaging results that were available during my care of the patient were reviewed by me and considered in my medical decision making (see chart for details).     Patient's work-up for the right facial numbness and numbness and tingling to her right thumb and index finger without any acute findings.  CT negative and MRI negative.  Also here patient's blood pressures have been systolic in the 120 range.  No significant hypertensive trend.  Will have patient follow-up with neurology.  Patient will return for any new or worse symptoms.  She will follow-up with her primary care doctor  for recheck of her blood pressure.  MRI showed no evidence of any acute stroke.  Final Clinical Impressions(s) / ED Diagnoses   Final diagnoses:  Numbness  Essential hypertension    ED Discharge Orders    None       Vanetta Mulders, MD 11/10/17 1724

## 2018-01-22 ENCOUNTER — Telehealth: Payer: Self-pay | Admitting: Obstetrics & Gynecology

## 2018-01-22 MED ORDER — NITROFURANTOIN MONOHYD MACRO 100 MG PO CAPS: 100.0000 mg | ORAL_CAPSULE | Freq: Two times a day (BID) | ORAL | 0 refills | Status: DC

## 2018-01-22 NOTE — Telephone Encounter (Signed)
macrobid was e prescribed

## 2018-01-22 NOTE — Telephone Encounter (Signed)
Pt has a uti would like to see if Dr Despina HiddenEUre would call her in something, [name] please call pt and advise, okay to leave message if she does not answer she is at work

## 2018-01-22 NOTE — Telephone Encounter (Signed)
Meds ordered this encounter  Medications   nitrofurantoin, macrocrystal-monohydrate, (MACROBID) 100 MG capsule    Sig: Take 1 capsule (100 mg total) by mouth 2 (two) times daily.    Dispense:  14 capsule    Refill:  0    

## 2018-01-23 NOTE — Telephone Encounter (Signed)
LMOVM that Macrobid was sent to pharmamcy.

## 2018-03-10 DIAGNOSIS — E782 Mixed hyperlipidemia: Secondary | ICD-10-CM | POA: Diagnosis not present

## 2018-03-10 DIAGNOSIS — M79673 Pain in unspecified foot: Secondary | ICD-10-CM | POA: Diagnosis not present

## 2018-03-10 DIAGNOSIS — R030 Elevated blood-pressure reading, without diagnosis of hypertension: Secondary | ICD-10-CM | POA: Diagnosis not present

## 2018-03-10 DIAGNOSIS — L2089 Other atopic dermatitis: Secondary | ICD-10-CM | POA: Diagnosis not present

## 2018-03-10 DIAGNOSIS — E785 Hyperlipidemia, unspecified: Secondary | ICD-10-CM | POA: Diagnosis not present

## 2018-03-14 DIAGNOSIS — M545 Low back pain: Secondary | ICD-10-CM | POA: Diagnosis not present

## 2018-03-14 DIAGNOSIS — E782 Mixed hyperlipidemia: Secondary | ICD-10-CM | POA: Diagnosis not present

## 2018-08-06 DIAGNOSIS — M79675 Pain in left toe(s): Secondary | ICD-10-CM | POA: Diagnosis not present

## 2018-08-06 DIAGNOSIS — S93502A Unspecified sprain of left great toe, initial encounter: Secondary | ICD-10-CM | POA: Diagnosis not present

## 2018-08-06 DIAGNOSIS — S90112A Contusion of left great toe without damage to nail, initial encounter: Secondary | ICD-10-CM | POA: Diagnosis not present

## 2018-10-16 DIAGNOSIS — E782 Mixed hyperlipidemia: Secondary | ICD-10-CM | POA: Diagnosis not present

## 2018-10-23 ENCOUNTER — Other Ambulatory Visit: Payer: Self-pay

## 2018-10-23 ENCOUNTER — Other Ambulatory Visit: Payer: BLUE CROSS/BLUE SHIELD

## 2018-10-23 DIAGNOSIS — Z20822 Contact with and (suspected) exposure to covid-19: Secondary | ICD-10-CM

## 2018-10-23 DIAGNOSIS — R6889 Other general symptoms and signs: Secondary | ICD-10-CM | POA: Diagnosis not present

## 2018-10-25 LAB — NOVEL CORONAVIRUS, NAA: SARS-CoV-2, NAA: NOT DETECTED

## 2018-10-28 ENCOUNTER — Encounter (INDEPENDENT_AMBULATORY_CARE_PROVIDER_SITE_OTHER): Payer: Self-pay

## 2018-10-30 DIAGNOSIS — M545 Low back pain: Secondary | ICD-10-CM | POA: Diagnosis not present

## 2018-10-30 DIAGNOSIS — Z124 Encounter for screening for malignant neoplasm of cervix: Secondary | ICD-10-CM | POA: Diagnosis not present

## 2018-10-30 DIAGNOSIS — R030 Elevated blood-pressure reading, without diagnosis of hypertension: Secondary | ICD-10-CM | POA: Diagnosis not present

## 2018-10-30 DIAGNOSIS — Z0001 Encounter for general adult medical examination with abnormal findings: Secondary | ICD-10-CM | POA: Diagnosis not present

## 2018-10-30 DIAGNOSIS — E782 Mixed hyperlipidemia: Secondary | ICD-10-CM | POA: Diagnosis not present

## 2018-11-16 DIAGNOSIS — F419 Anxiety disorder, unspecified: Secondary | ICD-10-CM | POA: Diagnosis not present

## 2018-11-16 DIAGNOSIS — I10 Essential (primary) hypertension: Secondary | ICD-10-CM | POA: Diagnosis not present

## 2019-01-07 DIAGNOSIS — L820 Inflamed seborrheic keratosis: Secondary | ICD-10-CM | POA: Diagnosis not present

## 2019-01-07 DIAGNOSIS — L821 Other seborrheic keratosis: Secondary | ICD-10-CM | POA: Diagnosis not present

## 2019-01-07 DIAGNOSIS — B078 Other viral warts: Secondary | ICD-10-CM | POA: Diagnosis not present

## 2019-05-07 DIAGNOSIS — R0981 Nasal congestion: Secondary | ICD-10-CM | POA: Diagnosis not present

## 2019-05-07 DIAGNOSIS — R067 Sneezing: Secondary | ICD-10-CM | POA: Diagnosis not present

## 2019-05-07 DIAGNOSIS — R05 Cough: Secondary | ICD-10-CM | POA: Diagnosis not present

## 2019-05-14 DIAGNOSIS — H35033 Hypertensive retinopathy, bilateral: Secondary | ICD-10-CM | POA: Diagnosis not present

## 2019-06-28 DIAGNOSIS — F411 Generalized anxiety disorder: Secondary | ICD-10-CM | POA: Diagnosis not present

## 2019-06-28 DIAGNOSIS — G4700 Insomnia, unspecified: Secondary | ICD-10-CM | POA: Diagnosis not present

## 2019-07-22 DIAGNOSIS — H18513 Endothelial corneal dystrophy, bilateral: Secondary | ICD-10-CM | POA: Diagnosis not present

## 2019-07-22 DIAGNOSIS — H25813 Combined forms of age-related cataract, bilateral: Secondary | ICD-10-CM | POA: Diagnosis not present

## 2019-07-22 DIAGNOSIS — H3562 Retinal hemorrhage, left eye: Secondary | ICD-10-CM | POA: Diagnosis not present

## 2019-07-22 DIAGNOSIS — H52213 Irregular astigmatism, bilateral: Secondary | ICD-10-CM | POA: Diagnosis not present

## 2019-08-16 ENCOUNTER — Other Ambulatory Visit: Payer: Self-pay | Admitting: Obstetrics & Gynecology

## 2019-08-16 DIAGNOSIS — Z1231 Encounter for screening mammogram for malignant neoplasm of breast: Secondary | ICD-10-CM

## 2019-08-20 DIAGNOSIS — H25813 Combined forms of age-related cataract, bilateral: Secondary | ICD-10-CM | POA: Diagnosis not present

## 2019-08-23 ENCOUNTER — Ambulatory Visit
Admission: RE | Admit: 2019-08-23 | Discharge: 2019-08-23 | Disposition: A | Discharge status: Home / Self Care | Payer: BC Managed Care – PPO | Source: Ambulatory Visit | Attending: Obstetrics & Gynecology | Admitting: Obstetrics & Gynecology

## 2019-08-23 ENCOUNTER — Other Ambulatory Visit: Payer: Self-pay

## 2019-08-23 DIAGNOSIS — Z1231 Encounter for screening mammogram for malignant neoplasm of breast: Secondary | ICD-10-CM

## 2019-08-24 DIAGNOSIS — Z01818 Encounter for other preprocedural examination: Secondary | ICD-10-CM | POA: Diagnosis not present

## 2019-08-24 DIAGNOSIS — Z01812 Encounter for preprocedural laboratory examination: Secondary | ICD-10-CM | POA: Diagnosis not present

## 2019-08-24 DIAGNOSIS — H25811 Combined forms of age-related cataract, right eye: Secondary | ICD-10-CM | POA: Diagnosis not present

## 2019-08-24 DIAGNOSIS — Z20822 Contact with and (suspected) exposure to covid-19: Secondary | ICD-10-CM | POA: Diagnosis not present

## 2019-08-27 DIAGNOSIS — H527 Unspecified disorder of refraction: Secondary | ICD-10-CM | POA: Diagnosis not present

## 2019-08-27 DIAGNOSIS — Z880 Allergy status to penicillin: Secondary | ICD-10-CM | POA: Diagnosis not present

## 2019-08-27 DIAGNOSIS — H25813 Combined forms of age-related cataract, bilateral: Secondary | ICD-10-CM | POA: Diagnosis not present

## 2019-08-27 DIAGNOSIS — Z79899 Other long term (current) drug therapy: Secondary | ICD-10-CM | POA: Diagnosis not present

## 2019-08-27 DIAGNOSIS — Z888 Allergy status to other drugs, medicaments and biological substances status: Secondary | ICD-10-CM | POA: Diagnosis not present

## 2019-08-27 DIAGNOSIS — H25811 Combined forms of age-related cataract, right eye: Secondary | ICD-10-CM | POA: Insufficient documentation

## 2019-08-27 DIAGNOSIS — I10 Essential (primary) hypertension: Secondary | ICD-10-CM | POA: Diagnosis not present

## 2019-08-27 DIAGNOSIS — H52213 Irregular astigmatism, bilateral: Secondary | ICD-10-CM | POA: Diagnosis not present

## 2019-08-28 DIAGNOSIS — H52213 Irregular astigmatism, bilateral: Secondary | ICD-10-CM | POA: Diagnosis not present

## 2019-08-28 DIAGNOSIS — H25812 Combined forms of age-related cataract, left eye: Secondary | ICD-10-CM | POA: Diagnosis not present

## 2019-08-28 DIAGNOSIS — Z961 Presence of intraocular lens: Secondary | ICD-10-CM | POA: Diagnosis not present

## 2019-08-31 DIAGNOSIS — H25812 Combined forms of age-related cataract, left eye: Secondary | ICD-10-CM | POA: Diagnosis not present

## 2019-08-31 DIAGNOSIS — Z01812 Encounter for preprocedural laboratory examination: Secondary | ICD-10-CM | POA: Diagnosis not present

## 2019-08-31 DIAGNOSIS — Z20822 Contact with and (suspected) exposure to covid-19: Secondary | ICD-10-CM | POA: Diagnosis not present

## 2019-09-03 DIAGNOSIS — H25812 Combined forms of age-related cataract, left eye: Secondary | ICD-10-CM | POA: Insufficient documentation

## 2019-09-03 DIAGNOSIS — I10 Essential (primary) hypertension: Secondary | ICD-10-CM | POA: Diagnosis not present

## 2019-09-03 DIAGNOSIS — F17210 Nicotine dependence, cigarettes, uncomplicated: Secondary | ICD-10-CM | POA: Diagnosis not present

## 2019-09-03 DIAGNOSIS — H52213 Irregular astigmatism, bilateral: Secondary | ICD-10-CM | POA: Diagnosis not present

## 2019-09-03 DIAGNOSIS — H18512 Endothelial corneal dystrophy, left eye: Secondary | ICD-10-CM | POA: Diagnosis not present

## 2019-09-04 DIAGNOSIS — Z961 Presence of intraocular lens: Secondary | ICD-10-CM | POA: Diagnosis not present

## 2019-09-04 DIAGNOSIS — H52213 Irregular astigmatism, bilateral: Secondary | ICD-10-CM | POA: Diagnosis not present

## 2019-09-07 ENCOUNTER — Ambulatory Visit: Payer: BC Managed Care – PPO | Attending: Oncology

## 2019-09-07 DIAGNOSIS — Z23 Encounter for immunization: Secondary | ICD-10-CM

## 2019-09-07 NOTE — Progress Notes (Signed)
   Covid-19 Vaccination Clinic  Name:  Anna [name]    MRN: [id number] DOB: October 12, 1965  09/07/2019  [name] was observed post Covid-19 immunization for 15 minutes without incident. She was provided with Vaccine Information Sheet and instruction to access the V-Safe system.   [name] was instructed to call 911 with any severe reactions post vaccine: [name] Difficulty breathing  . Swelling of face and throat  . A fast heartbeat  . A bad rash all over body  . Dizziness and weakness   Immunizations Administered    Name Date Dose VIS Date Route   Pfizer COVID-19 Vaccine 09/07/2019 10:47 AM 0.3 mL 07/03/2018 Intramuscular   Manufacturer: [employer], [employer]   Lot: [id number]   NDC: [address]-9

## 2019-09-23 ENCOUNTER — Ambulatory Visit (INDEPENDENT_AMBULATORY_CARE_PROVIDER_SITE_OTHER): Payer: BC Managed Care – PPO | Admitting: Obstetrics & Gynecology

## 2019-09-23 ENCOUNTER — Encounter: Payer: Self-pay | Admitting: Obstetrics & Gynecology

## 2019-09-23 ENCOUNTER — Other Ambulatory Visit (HOSPITAL_COMMUNITY)
Admission: RE | Admit: 2019-09-23 | Discharge: 2019-09-23 | Disposition: A | Discharge status: Home / Self Care | Payer: BC Managed Care – PPO | Source: Ambulatory Visit | Attending: Obstetrics & Gynecology | Admitting: Obstetrics & Gynecology

## 2019-09-23 VITALS — BP 114/73 | HR 89 | Ht 63.5 in | Wt 140.5 lb

## 2019-09-23 DIAGNOSIS — Z1211 Encounter for screening for malignant neoplasm of colon: Secondary | ICD-10-CM

## 2019-09-23 DIAGNOSIS — Z1212 Encounter for screening for malignant neoplasm of rectum: Secondary | ICD-10-CM

## 2019-09-23 DIAGNOSIS — Z01419 Encounter for gynecological examination (general) (routine) without abnormal findings: Secondary | ICD-10-CM | POA: Diagnosis not present

## 2019-09-23 LAB — HEMOCCULT GUIAC POC 1CARD (OFFICE): Fecal Occult Blood, POC: NEGATIVE

## 2019-09-23 MED ORDER — ESTRADIOL 10 MCG VA TABS
1.0000 | ORAL_TABLET | VAGINAL | 12 refills | Status: DC
Start: 2019-09-23 — End: 2022-02-22

## 2019-09-23 NOTE — Addendum Note (Signed)
Addended by: Colen Darling on: 09/23/2019 02:26 PM   Modules accepted: Orders

## 2019-09-23 NOTE — Progress Notes (Signed)
Subjective:     Anna [name] is a 54 y.o. female here for a routine exam.  No LMP recorded. Patient has had an ablation. G2P0 Birth Control Method:  Post menopausal Menstrual Calendar(currently): amenorrheic  Current complaints: none.   Current acute medical issues:  none   Recent Gynecologic History No LMP recorded. Patient has had an ablation. Last Pap: 2019,  normal Last mammogram: 4/21,  normal  Past Medical History:  Diagnosis Date  . Allergy   . Anemia   . Anxiety   . Arthritis    lower back  . Bilateral low back pain with right-sided sciatica   . Depression   . Hyperlipidemia   . No pertinent past medical history   . Tobacco abuse 07/18/2016    Past Surgical History:  Procedure Laterality Date  . CATARACT EXTRACTION, BILATERAL    . COLONOSCOPY  01/10/2012   Procedure: COLONOSCOPY;  Surgeon: Dalia Heading, MD;  Location: [facility];  Service: Gastroenterology;  Laterality: N/A;  . CYST EXCISION Right    hand  . DILATION AND CURETTAGE OF UTERUS     Ablation  . FOOT SURGERY Left   . RT HAND SURGERY    . SPINAL FUSION    . TONSILLECTOMY      OB History    Gravida  2   Para      Term      Preterm      AB      Living  2     SAB      TAB      Ectopic      Multiple      Live Births              Social History   Socioeconomic History  . Marital status: Divorced    Spouse name: Not on file  . Number of children: 2  . Years of education: 65  . Highest education level: Not on file  Occupational History  . Occupation: team leader    Comment: [employer]  Tobacco Use  . Smoking status: Former Smoker    Packs/day: 0.50    Types: Cigarettes    Start date: 05/09/1984    Quit date: 08/09/2016    Years since quitting: 3.1  . Smokeless tobacco: Never Used  Substance and Sexual Activity  . Alcohol use: Yes    Alcohol/week: 0.0 standard drinks    Comment: once every few months  . Drug use: No  . Sexual activity: Yes    Birth  control/protection: Surgical    Comment: ablation  Other Topics Concern  . Not on file  Social History Narrative   Lives alone   Two grown daughters   Significant friend - 1 1/2 years   Loves to walk   Social Determinants of Health   Financial Resource Strain: Low Risk   . Difficulty of Paying Living Expenses: Not hard at all  Food Insecurity: No Food Insecurity  . Worried About Programme researcher, broadcasting/film/video in the Last Year: Never true  . Ran Out of Food in the Last Year: Never true  Transportation Needs: No Transportation Needs  . Lack of Transportation (Medical): No  . Lack of Transportation (Non-Medical): No  Physical Activity: Sufficiently Active  . Days of Exercise per Week: 5 days  . Minutes of Exercise per Session: 90 min  Stress: No Stress Concern Present  . Feeling of Stress : Only a little  Social Connections: Somewhat Isolated  .  Frequency of Communication with Friends and Family: More than three times a week  . Frequency of Social Gatherings with Friends and Family: Once a week  . Attends Religious Services: Never  . Active Member of Clubs or Organizations: No  . Attends Banker Meetings: Never  . Marital Status: Living with partner    Family History  Problem Relation Age of Onset  . Hypertension Mother   . Diabetes Mother   . Heart attack Mother 64  . Heart disease Mother   . Kidney disease Mother   . Hyperlipidemia Mother   . Lymphoma Father        non-hodgkins  . Cancer Father   . Arthritis Father   . Hyperlipidemia Father   . Breast cancer Cousin        in 51's  . Breast cancer Cousin        in 60's     Current Outpatient Medications:  .  escitalopram (LEXAPRO) 5 MG tablet, Take 5 mg by mouth daily., Disp: , Rfl:  .  ibuprofen (ADVIL,MOTRIN) 200 MG tablet, Take 400 mg by mouth every 6 (six) hours as needed. For pain, Disp: , Rfl:  .  loratadine (CLARITIN) 10 MG tablet, Take 10 mg by mouth daily., Disp: , Rfl:  .  Multiple Vitamin  (MULTI-VITAMIN) tablet, Take by mouth., Disp: , Rfl:  .  triamterene-hydrochlorothiazide (DYAZIDE) 37.5-25 MG capsule, Take 1 each (1 capsule total) by mouth daily., Disp: 30 capsule, Rfl: 3 .  Estradiol 10 MCG TABS vaginal tablet, Place 1 tablet (10 mcg total) vaginally every other day. Every other night, Disp: 19 tablet, Rfl: 12  Review of Systems  Review of Systems  Constitutional: Negative for fever, chills, weight loss, malaise/fatigue and diaphoresis.  HENT: Negative for hearing loss, ear pain, nosebleeds, congestion, sore throat, neck pain, tinnitus and ear discharge.   Eyes: Negative for blurred vision, double vision, photophobia, pain, discharge and redness.  Respiratory: Negative for cough, hemoptysis, sputum production, shortness of breath, wheezing and stridor.   Cardiovascular: Negative for chest pain, palpitations, orthopnea, claudication, leg swelling and PND.  Gastrointestinal: negative for abdominal pain. Negative for heartburn, nausea, vomiting, diarrhea, constipation, blood in stool and melena.  Genitourinary: Negative for dysuria, urgency, frequency, hematuria and flank pain.  Musculoskeletal: Negative for myalgias, back pain, joint pain and falls.  Skin: Negative for itching and rash.  Neurological: Negative for dizziness, tingling, tremors, sensory change, speech change, focal weakness, seizures, loss of consciousness, weakness and headaches.  Endo/Heme/Allergies: Negative for environmental allergies and polydipsia. Does not bruise/bleed easily.  Psychiatric/Behavioral: Negative for depression, suicidal ideas, hallucinations, memory loss and substance abuse. The patient is not nervous/anxious and does not have insomnia.        Objective:  Blood pressure 114/73, pulse 89, height 5' 3.5" (1.613 m), weight 140 lb 8 oz (63.7 kg).   Physical Exam  Vitals reviewed. Constitutional: She is oriented to person, place, and time. She appears well-developed and well-nourished.   HENT:  Head: Normocephalic and atraumatic.        Right Ear: External ear normal.  Left Ear: External ear normal.  Nose: Nose normal.  Mouth/Throat: Oropharynx is clear and moist.  Eyes: Conjunctivae and EOM are normal. Pupils are equal, round, and reactive to light. Right eye exhibits no discharge. Left eye exhibits no discharge. No scleral icterus.  Neck: Normal range of motion. Neck supple. No tracheal deviation present. No thyromegaly present.  Cardiovascular: Normal rate, regular rhythm, normal heart sounds and  intact distal pulses.  Exam reveals no gallop and no friction rub.   No murmur heard. Respiratory: Effort normal and breath sounds normal. No respiratory distress. She has no wheezes. She has no rales. She exhibits no tenderness.  GI: Soft. Bowel sounds are normal. She exhibits no distension and no mass. There is no tenderness. There is no rebound and no guarding.  Genitourinary:  Breasts no masses skin changes or nipple changes bilaterally      Vulva is normal without lesions Vagina is pink moist without discharge Cervix normal in appearance and pap is done Uterus is normal size shape and contour Adnexa is negative with normal sized ovaries  {Rectal    hemoccult negative, normal tone, no masses  Musculoskeletal: Normal range of motion. She exhibits no edema and no tenderness.  Neurological: She is alert and oriented to person, place, and time. She has normal reflexes. She displays normal reflexes. No cranial nerve deficit. She exhibits normal muscle tone. Coordination normal.  Skin: Skin is warm and dry. No rash noted. No erythema. No pallor.  Psychiatric: She has a normal mood and affect. Her behavior is normal. Judgment and thought content normal.       Medications Ordered at today's visit: Meds ordered this encounter  Medications  . Estradiol 10 MCG TABS vaginal tablet    Sig: Place 1 tablet (10 mcg total) vaginally every other day. Every other night    Dispense:  19  tablet    Refill:  12    Other orders placed at today's visit: No orders of the defined types were placed in this encounter.     Assessment:    Normal Gyn exam.    Plan:    Mammogram ordered. Follow up in: 3 years.     Return in about 3 years (around 09/23/2022) for yearly.

## 2019-09-26 LAB — CYTOLOGY - PAP
Comment: NEGATIVE
Diagnosis: NEGATIVE
Diagnosis: REACTIVE
High risk HPV: NEGATIVE

## 2019-09-27 DIAGNOSIS — Z961 Presence of intraocular lens: Secondary | ICD-10-CM | POA: Diagnosis not present

## 2019-10-01 ENCOUNTER — Ambulatory Visit: Payer: BC Managed Care – PPO | Attending: Internal Medicine

## 2019-10-01 DIAGNOSIS — Z23 Encounter for immunization: Secondary | ICD-10-CM

## 2019-10-01 NOTE — Progress Notes (Signed)
   Covid-19 Vaccination Clinic  Name:  Anna [name]    MRN: [id number] DOB: August 18, 1965  10/01/2019  Anna [name] was observed post Covid-19 immunization for 15 minutes without incident. She was provided with Vaccine Information Sheet and instruction to access the V-Safe system.   Anna [name] was instructed to call 911 with any severe reactions post vaccine: [name] Difficulty breathing  . Swelling of face and throat  . A fast heartbeat  . A bad rash all over body  . Dizziness and weakness   Immunizations Administered    Name Date Dose VIS Date Route   Pfizer COVID-19 Vaccine 10/01/2019  2:21 PM 0.3 mL 07/03/2018 Intramuscular   Manufacturer: [employer], [employer]   Lot: [id number]   NDC: [address]-3

## 2019-12-03 ENCOUNTER — Telehealth: Payer: Self-pay | Admitting: Obstetrics & Gynecology

## 2019-12-03 MED ORDER — SULFAMETHOXAZOLE-TRIMETHOPRIM 800-160 MG PO TABS
1.0000 | ORAL_TABLET | Freq: Two times a day (BID) | ORAL | 0 refills | Status: DC
Start: 2019-12-03 — End: 2022-02-22

## 2020-02-25 DIAGNOSIS — E785 Hyperlipidemia, unspecified: Secondary | ICD-10-CM | POA: Diagnosis not present

## 2020-02-25 DIAGNOSIS — F419 Anxiety disorder, unspecified: Secondary | ICD-10-CM | POA: Diagnosis not present

## 2020-02-25 DIAGNOSIS — E782 Mixed hyperlipidemia: Secondary | ICD-10-CM | POA: Diagnosis not present

## 2020-02-25 DIAGNOSIS — R030 Elevated blood-pressure reading, without diagnosis of hypertension: Secondary | ICD-10-CM | POA: Diagnosis not present

## 2020-02-25 DIAGNOSIS — I10 Essential (primary) hypertension: Secondary | ICD-10-CM | POA: Diagnosis not present

## 2020-02-25 DIAGNOSIS — M79673 Pain in unspecified foot: Secondary | ICD-10-CM | POA: Diagnosis not present

## 2020-02-26 DIAGNOSIS — Z23 Encounter for immunization: Secondary | ICD-10-CM | POA: Diagnosis not present

## 2020-02-26 DIAGNOSIS — Z0001 Encounter for general adult medical examination with abnormal findings: Secondary | ICD-10-CM | POA: Diagnosis not present

## 2020-03-03 ENCOUNTER — Other Ambulatory Visit (HOSPITAL_COMMUNITY): Payer: Self-pay | Admitting: Internal Medicine

## 2020-03-03 DIAGNOSIS — Z1231 Encounter for screening mammogram for malignant neoplasm of breast: Secondary | ICD-10-CM

## 2020-06-05 DIAGNOSIS — F17210 Nicotine dependence, cigarettes, uncomplicated: Secondary | ICD-10-CM | POA: Diagnosis not present

## 2020-06-05 DIAGNOSIS — M791 Myalgia, unspecified site: Secondary | ICD-10-CM | POA: Diagnosis not present

## 2020-07-03 DIAGNOSIS — F17210 Nicotine dependence, cigarettes, uncomplicated: Secondary | ICD-10-CM | POA: Diagnosis not present

## 2020-07-03 DIAGNOSIS — G4700 Insomnia, unspecified: Secondary | ICD-10-CM | POA: Diagnosis not present

## 2020-08-27 DIAGNOSIS — I10 Essential (primary) hypertension: Secondary | ICD-10-CM | POA: Diagnosis not present

## 2020-08-27 DIAGNOSIS — E785 Hyperlipidemia, unspecified: Secondary | ICD-10-CM | POA: Diagnosis not present

## 2020-08-27 DIAGNOSIS — Z131 Encounter for screening for diabetes mellitus: Secondary | ICD-10-CM | POA: Diagnosis not present

## 2020-08-27 DIAGNOSIS — E782 Mixed hyperlipidemia: Secondary | ICD-10-CM | POA: Diagnosis not present

## 2020-08-27 DIAGNOSIS — Z79899 Other long term (current) drug therapy: Secondary | ICD-10-CM | POA: Diagnosis not present

## 2020-12-12 DIAGNOSIS — F5104 Psychophysiologic insomnia: Secondary | ICD-10-CM | POA: Diagnosis not present

## 2020-12-12 DIAGNOSIS — J019 Acute sinusitis, unspecified: Secondary | ICD-10-CM | POA: Diagnosis not present

## 2020-12-12 DIAGNOSIS — F17200 Nicotine dependence, unspecified, uncomplicated: Secondary | ICD-10-CM | POA: Diagnosis not present

## 2020-12-14 DIAGNOSIS — R519 Headache, unspecified: Secondary | ICD-10-CM | POA: Diagnosis not present

## 2020-12-14 DIAGNOSIS — U071 COVID-19: Secondary | ICD-10-CM | POA: Diagnosis not present

## 2021-02-13 IMAGING — MG DIGITAL SCREENING BILAT W/ TOMO W/ CAD
8 series · 8 of 24 positions shown · non-contrast
Comparison: Previous exam(s).

CLINICAL DATA: Screening.

EXAM:
DIGITAL SCREENING BILATERAL MAMMOGRAM WITH TOMO AND CAD

[R CC synth-2D]
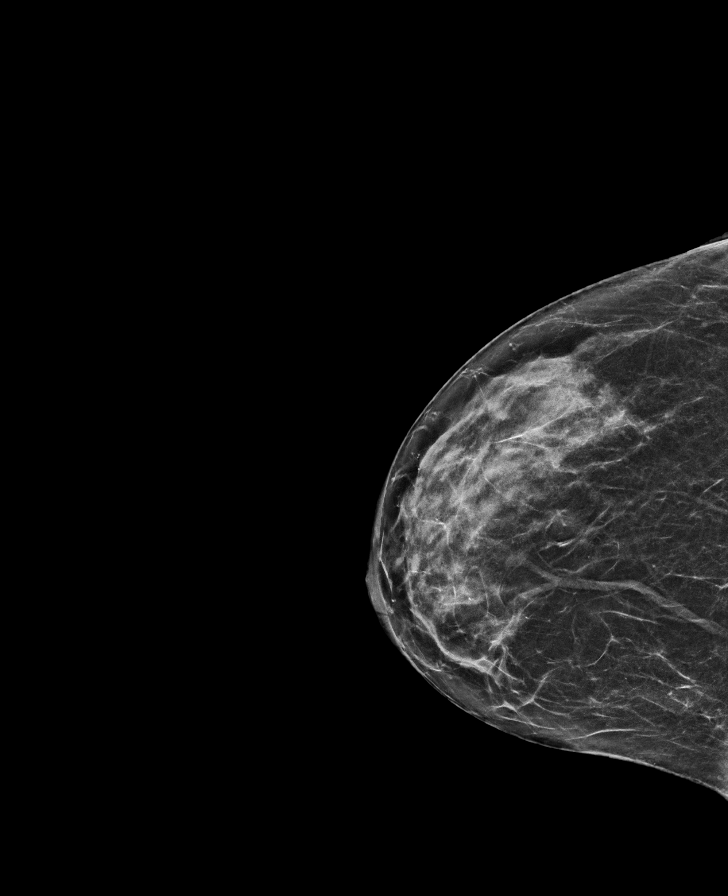

[L MLO synth-2D]
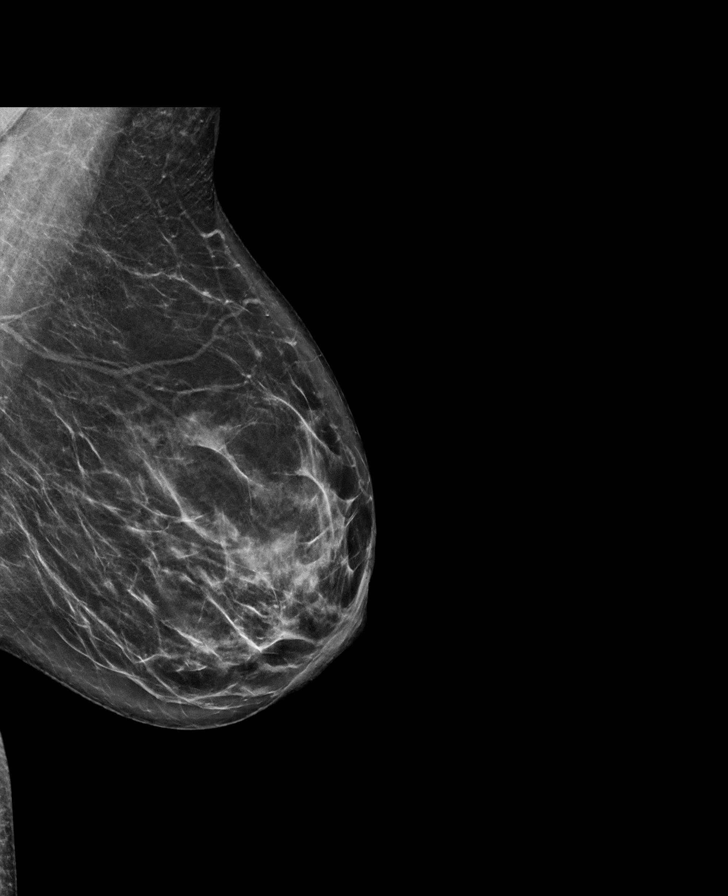

[L CC synth-2D]
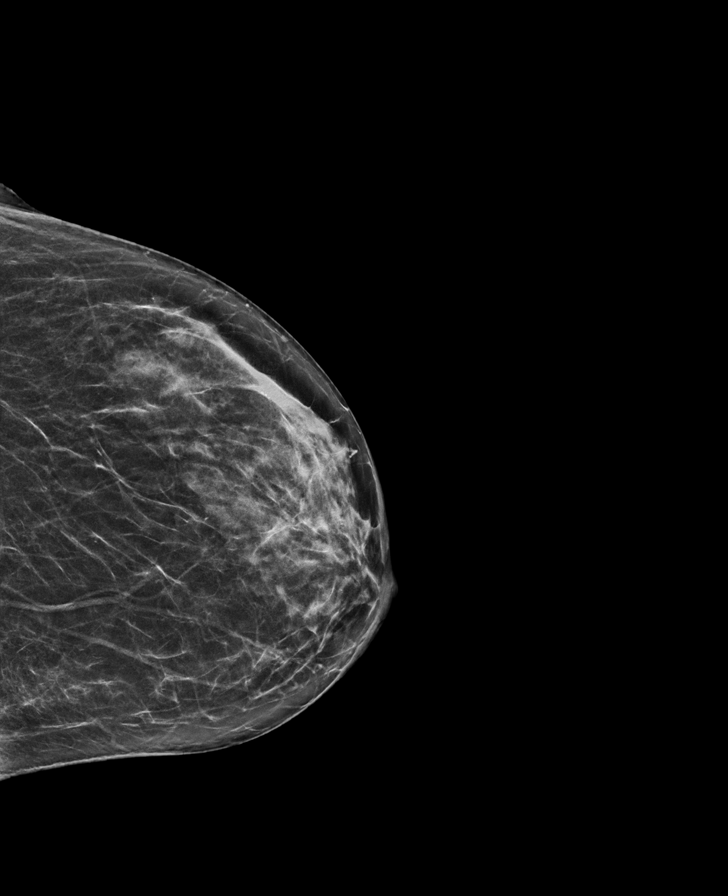

[R MLO synth-2D]
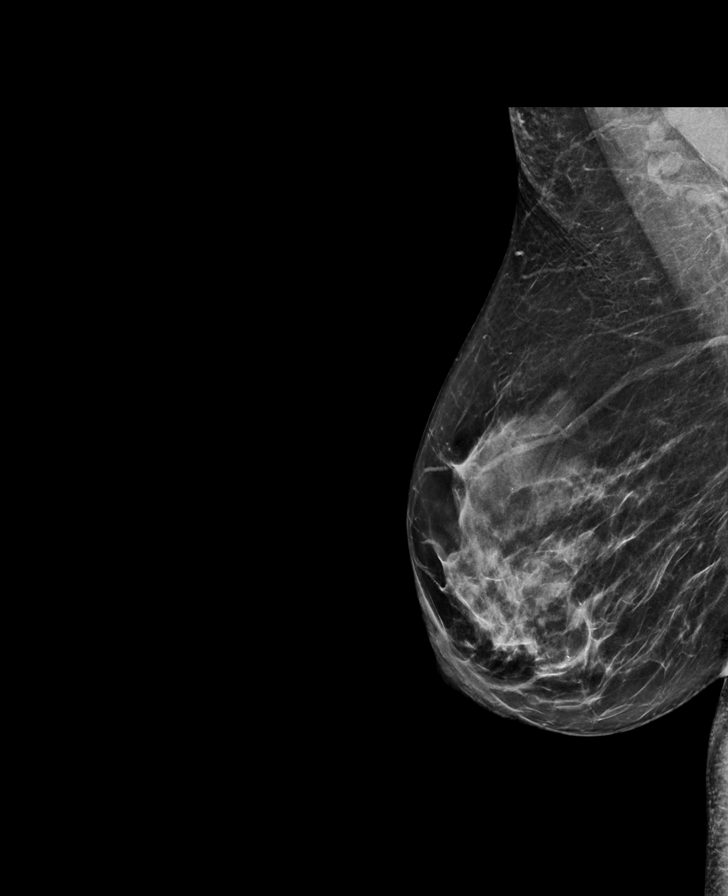

[R CC tomo · tomo slice 31/60.0]
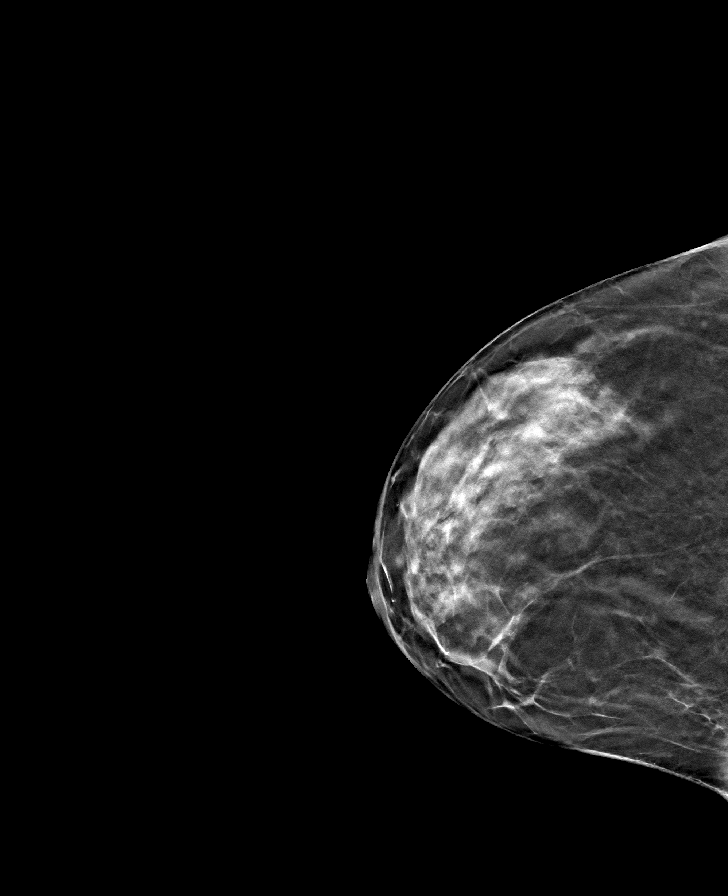

[L MLO tomo · tomo slice 37/72.0]
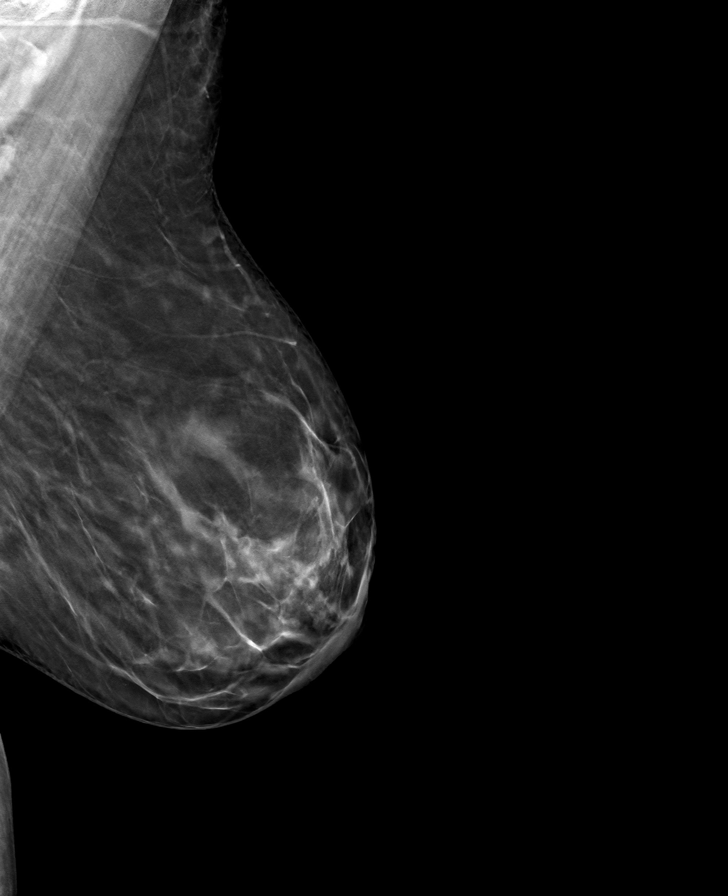

[L CC tomo · tomo slice 30/59.0]
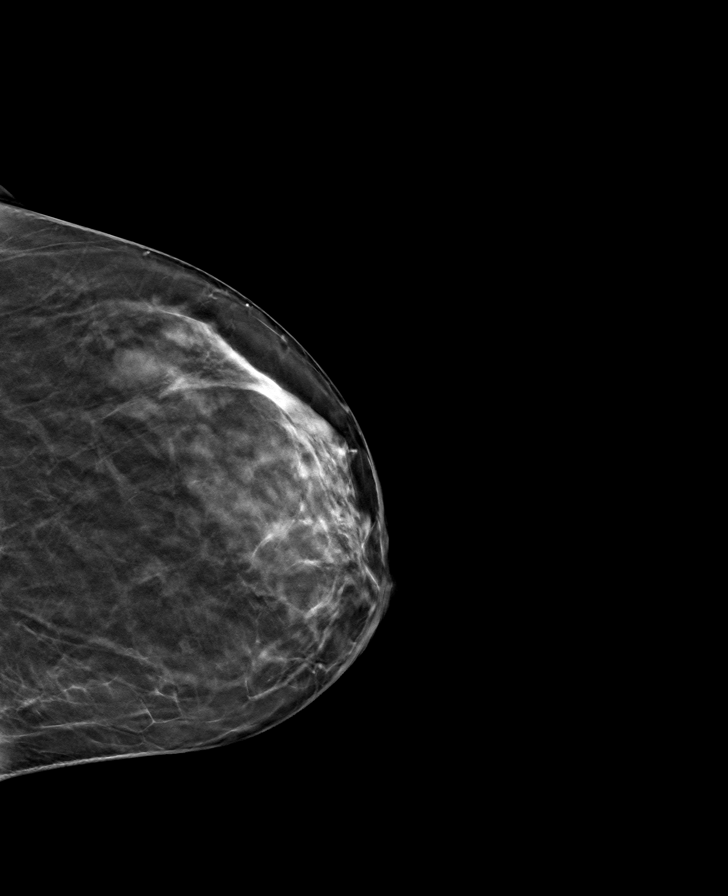

[R MLO tomo · tomo slice 35/70.0]
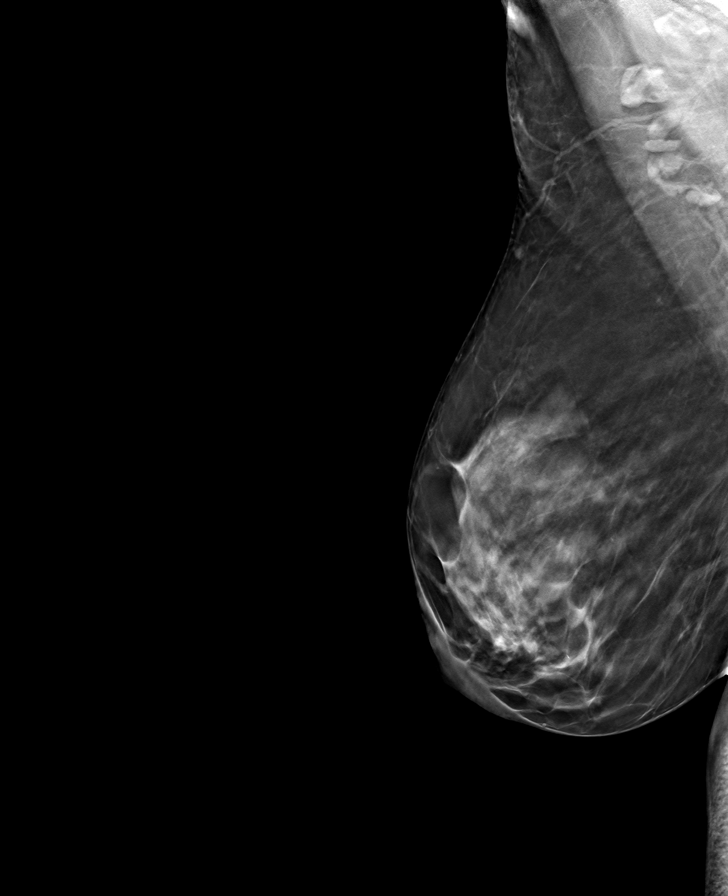

[8 of 24 positions shown; findings below may reference images not displayed]

ACR Breast Density Category c: The breast tissue is heterogeneously
dense, which may obscure small masses.
FINDINGS: There are no findings suspicious for malignancy. Images were
processed with CAD.
IMPRESSION: No mammographic evidence of malignancy. A result letter of this
screening mammogram will be mailed directly to the patient.

RECOMMENDATION:
Screening mammogram in one year. (Code:FT-[id number]-LHB)

BI-RADS CATEGORY  1: Negative.

## 2021-02-17 DIAGNOSIS — I10 Essential (primary) hypertension: Secondary | ICD-10-CM | POA: Diagnosis not present

## 2021-02-20 DIAGNOSIS — I10 Essential (primary) hypertension: Secondary | ICD-10-CM | POA: Diagnosis not present

## 2021-02-20 DIAGNOSIS — F17200 Nicotine dependence, unspecified, uncomplicated: Secondary | ICD-10-CM | POA: Diagnosis not present

## 2021-02-20 DIAGNOSIS — F411 Generalized anxiety disorder: Secondary | ICD-10-CM | POA: Diagnosis not present

## 2021-02-20 DIAGNOSIS — Z23 Encounter for immunization: Secondary | ICD-10-CM | POA: Diagnosis not present

## 2021-02-20 DIAGNOSIS — E782 Mixed hyperlipidemia: Secondary | ICD-10-CM | POA: Diagnosis not present

## 2021-03-06 DIAGNOSIS — Z1211 Encounter for screening for malignant neoplasm of colon: Secondary | ICD-10-CM | POA: Diagnosis not present

## 2021-04-08 DIAGNOSIS — J069 Acute upper respiratory infection, unspecified: Secondary | ICD-10-CM | POA: Diagnosis not present

## 2021-08-18 DIAGNOSIS — E559 Vitamin D deficiency, unspecified: Secondary | ICD-10-CM | POA: Diagnosis not present

## 2021-08-18 DIAGNOSIS — I10 Essential (primary) hypertension: Secondary | ICD-10-CM | POA: Diagnosis not present

## 2021-08-21 DIAGNOSIS — F411 Generalized anxiety disorder: Secondary | ICD-10-CM | POA: Diagnosis not present

## 2021-08-21 DIAGNOSIS — F17200 Nicotine dependence, unspecified, uncomplicated: Secondary | ICD-10-CM | POA: Diagnosis not present

## 2021-08-21 DIAGNOSIS — E782 Mixed hyperlipidemia: Secondary | ICD-10-CM | POA: Diagnosis not present

## 2021-08-21 DIAGNOSIS — I10 Essential (primary) hypertension: Secondary | ICD-10-CM | POA: Diagnosis not present

## 2021-09-16 ENCOUNTER — Other Ambulatory Visit: Payer: Self-pay | Admitting: Obstetrics & Gynecology

## 2021-09-16 DIAGNOSIS — Z1231 Encounter for screening mammogram for malignant neoplasm of breast: Secondary | ICD-10-CM

## 2021-09-20 ENCOUNTER — Ambulatory Visit
Admission: RE | Admit: 2021-09-20 | Discharge: 2021-09-20 | Disposition: A | Discharge status: Home / Self Care | Payer: BC Managed Care – PPO | Source: Ambulatory Visit | Attending: Obstetrics & Gynecology | Admitting: Obstetrics & Gynecology

## 2021-09-20 DIAGNOSIS — Z1231 Encounter for screening mammogram for malignant neoplasm of breast: Secondary | ICD-10-CM | POA: Diagnosis not present

## 2022-02-17 DIAGNOSIS — I10 Essential (primary) hypertension: Secondary | ICD-10-CM | POA: Diagnosis not present

## 2022-02-22 ENCOUNTER — Other Ambulatory Visit (HOSPITAL_COMMUNITY)
Admission: RE | Admit: 2022-02-22 | Discharge: 2022-02-22 | Disposition: A | Discharge status: Home / Self Care | Payer: BC Managed Care – PPO | Source: Ambulatory Visit | Attending: Obstetrics & Gynecology | Admitting: Obstetrics & Gynecology

## 2022-02-22 ENCOUNTER — Encounter: Payer: Self-pay | Admitting: Obstetrics & Gynecology

## 2022-02-22 ENCOUNTER — Ambulatory Visit (INDEPENDENT_AMBULATORY_CARE_PROVIDER_SITE_OTHER): Payer: BC Managed Care – PPO | Admitting: Obstetrics & Gynecology

## 2022-02-22 VITALS — BP 120/78 | Ht 63.0 in | Wt 131.0 lb

## 2022-02-22 DIAGNOSIS — Z23 Encounter for immunization: Secondary | ICD-10-CM | POA: Diagnosis not present

## 2022-02-22 DIAGNOSIS — E782 Mixed hyperlipidemia: Secondary | ICD-10-CM | POA: Diagnosis not present

## 2022-02-22 DIAGNOSIS — Z01419 Encounter for gynecological examination (general) (routine) without abnormal findings: Secondary | ICD-10-CM

## 2022-02-22 DIAGNOSIS — F411 Generalized anxiety disorder: Secondary | ICD-10-CM | POA: Diagnosis not present

## 2022-02-22 DIAGNOSIS — N952 Postmenopausal atrophic vaginitis: Secondary | ICD-10-CM | POA: Diagnosis not present

## 2022-02-22 DIAGNOSIS — F17200 Nicotine dependence, unspecified, uncomplicated: Secondary | ICD-10-CM | POA: Diagnosis not present

## 2022-02-22 DIAGNOSIS — I10 Essential (primary) hypertension: Secondary | ICD-10-CM | POA: Diagnosis not present

## 2022-02-22 MED ORDER — ESTRADIOL 10 MCG VA TABS: 1.0000 | ORAL_TABLET | VAGINAL | 12 refills | Status: DC

## 2022-02-22 NOTE — Progress Notes (Signed)
Subjective:     Anna [name] is a 56 y.o. female here for a routine exam.  No LMP recorded. Patient has had an ablation. G2P0 Birth Control Method:  menopausal Menstrual Calendar(currently): amenorrhea  Current complaints: VMS.   Current acute medical issues:     Recent Gynecologic History No LMP recorded. Patient has had an ablation. Last Pap: 2021,   Last mammogram: 5/23,  normal  Past Medical History:  Diagnosis Date   Allergy    Anemia    Anxiety    Arthritis    lower back   Bilateral low back pain with right-sided sciatica    Depression    Hyperlipidemia    No pertinent past medical history    Tobacco abuse 07/18/2016    Past Surgical History:  Procedure Laterality Date   CATARACT EXTRACTION, BILATERAL     COLONOSCOPY  01/10/2012   Procedure: COLONOSCOPY;  Surgeon: Jamesetta So, MD;  [facility];  Service: Gastroenterology;  Laterality: N/A;   CYST EXCISION Right    hand   DILATION AND CURETTAGE OF UTERUS     Ablation   FOOT SURGERY Left    RT HAND SURGERY     SPINAL FUSION     TONSILLECTOMY      OB History     Gravida  2   Para      Term      Preterm      AB      Living  2      SAB      IAB      Ectopic      Multiple      Live Births              Social History   Socioeconomic History   Marital status: Divorced    Spouse name: Not on file   Number of children: 2   Years of education: 14   Highest education level: Not on file  Occupational History   Occupation: team leader    Comment: [employer]  Tobacco Use   Smoking status: Former    Packs/day: 0.50    Types: Cigarettes    Start date: 05/09/1984    Quit date: 08/09/2016    Years since quitting: 5.5   Smokeless tobacco: Never  Vaping Use   Vaping Use: Never used  Substance and Sexual Activity   Alcohol use: Yes    Alcohol/week: 0.0 standard drinks of alcohol    Comment: once every few months   Drug use: No   Sexual activity: Yes    Birth  control/protection: Surgical    Comment: ablation, tubal  Other Topics Concern   Not on file  Social History Narrative   Lives alone   Two grown daughters   Significant friend - 1 1/2 years   Loves to walk   Social Determinants of Health   Financial Resource Strain: Low Risk  (09/23/2019)   Overall Financial Resource Strain (CARDIA)    Difficulty of Paying Living Expenses: Not hard at all  Food Insecurity: No Food Insecurity (02/22/2022)   Hunger Vital Sign    Worried About Running Out of Food in the Last Year: Never true    Ran Out of Food in the Last Year: Never true  Transportation Needs: No Transportation Needs (02/22/2022)   PRAPARE - Hydrologist (Medical): No    Lack of Transportation (Non-Medical): No  Physical Activity: Sufficiently Active (02/22/2022)  Exercise Vital Sign    Days of Exercise per Week: 7 days    Minutes of Exercise per Session: 100 min  Stress: Stress Concern Present (02/22/2022)   [place]    Feeling of Stress : To some extent  Social Connections: Moderately Isolated (02/22/2022)   Social Connection and Isolation Panel [NHANES]    Frequency of Communication with Friends and Family: More than three times a week    Frequency of Social Gatherings with Friends and Family: Once a week    Attends Religious Services: Never    Marine scientist or Organizations: No    Attends Music therapist: Never    Marital Status: Living with partner    Family History  Problem Relation Age of Onset   Hypertension Mother    Diabetes Mother    Heart attack Mother 21   Heart disease Mother    Kidney disease Mother    Hyperlipidemia Mother    Lymphoma Father        non-hodgkins   Cancer Father    Arthritis Father    Hyperlipidemia Father    Breast cancer Cousin        in 90's   Breast cancer Cousin        in 65's     Current Outpatient  Medications:    escitalopram (LEXAPRO) 5 MG tablet, Take 5 mg by mouth daily., Disp: , Rfl:    fenofibrate 160 MG tablet, Take 160 mg by mouth daily., Disp: , Rfl:    ibuprofen (ADVIL,MOTRIN) 200 MG tablet, Take 400 mg by mouth every 6 (six) hours as needed. For pain, Disp: , Rfl:    loratadine (CLARITIN) 10 MG tablet, Take 10 mg by mouth daily., Disp: , Rfl:    losartan (COZAAR) 25 MG tablet, Take 25 mg by mouth daily., Disp: , Rfl:    Multiple Vitamins-Minerals (OCUVITE PRESERVISION PO), Take 1 tablet by mouth daily., Disp: , Rfl:    Estradiol 10 MCG TABS vaginal tablet, Place 1 tablet (10 mcg total) vaginally every other day. Every other night, Disp: 15 tablet, Rfl: 12   triamterene-hydrochlorothiazide (DYAZIDE) 37.5-25 MG capsule, Take 1 each (1 capsule total) by mouth daily. (Patient not taking: Reported on 02/22/2022), Disp: 30 capsule, Rfl: 3  Review of Systems  Review of Systems  Constitutional: Negative for fever, chills, weight loss, malaise/fatigue and diaphoresis.  HENT: Negative for hearing loss, ear pain, nosebleeds, congestion, sore throat, neck pain, tinnitus and ear discharge.   Eyes: Negative for blurred vision, double vision, photophobia, pain, discharge and redness.  Respiratory: Negative for cough, hemoptysis, sputum production, shortness of breath, wheezing and stridor.   Cardiovascular: Negative for chest pain, palpitations, orthopnea, claudication, leg swelling and PND.  Gastrointestinal: negative for abdominal pain. Negative for heartburn, nausea, vomiting, diarrhea, constipation, blood in stool and melena.  Genitourinary: Negative for dysuria, urgency, frequency, hematuria and flank pain.  Musculoskeletal: Negative for myalgias, back pain, joint pain and falls.  Skin: Negative for itching and rash.  Neurological: Negative for dizziness, tingling, tremors, sensory change, speech change, focal weakness, seizures, loss of consciousness, weakness and headaches.   Endo/Heme/Allergies: Negative for environmental allergies and polydipsia. Does not bruise/bleed easily.  Psychiatric/Behavioral: Negative for depression, suicidal ideas, hallucinations, memory loss and substance abuse. The patient is not nervous/anxious and does not have insomnia.        Objective:  Blood pressure 120/78, height 5\' 3"  (1.6 m), weight 131 lb (59.4  kg).   Physical Exam  Vitals reviewed. Constitutional: She is oriented to person, place, and time. She appears well-developed and well-nourished.  HENT:  Head: Normocephalic and atraumatic.        Right Ear: External ear normal.  Left Ear: External ear normal.  Nose: Nose normal.  Mouth/Throat: Oropharynx is clear and moist.  Eyes: Conjunctivae and EOM are normal. Pupils are equal, round, and reactive to light. Right eye exhibits no discharge. Left eye exhibits no discharge. No scleral icterus.  Neck: Normal range of motion. Neck supple. No tracheal deviation present. No thyromegaly present.  Cardiovascular: Normal rate, regular rhythm, normal heart sounds and intact distal pulses.  Exam reveals no gallop and no friction rub.   No murmur heard. Respiratory: Effort normal and breath sounds normal. No respiratory distress. She has no wheezes. She has no rales. She exhibits no tenderness.  GI: Soft. Bowel sounds are normal. She exhibits no distension and no mass. There is no tenderness. There is no rebound and no guarding.  Genitourinary:  Breasts no masses skin changes or nipple changes bilaterally      Vulva is normal without lesions Vagina is pink moist without discharge Cervix normal in appearance and pap is done Uterus is normal size shape and contour Adnexa is negative with normal sized ovaries   Musculoskeletal: Normal range of motion. She exhibits no edema and no tenderness.  Neurological: She is alert and oriented to person, place, and time. She has normal reflexes. She displays normal reflexes. No cranial nerve  deficit. She exhibits normal muscle tone. Coordination normal.  Skin: Skin is warm and dry. No rash noted. No erythema. No pallor.  Psychiatric: She has a normal mood and affect. Her behavior is normal. Judgment and thought content normal.       Medications Ordered at today's visit: Meds ordered this encounter  Medications   Estradiol 10 MCG TABS vaginal tablet    Sig: Place 1 tablet (10 mcg total) vaginally every other day. Every other night    Dispense:  15 tablet    Refill:  12    Other orders placed at today's visit: No orders of the defined types were placed in this encounter.     Assessment:    Normal Gyn exam.      ICD-10-CM   1. Well woman exam with routine gynecological exam  Z01.419     2. Encounter for gynecological examination with Papanicolaou smear of cervix  Z01.419 Cytology - PAP    3. Vaginal atrophy  N95.2       Plan:    Estrogen 10 mcg every other night For atrophy     No follow-ups on file.

## 2022-02-24 LAB — CYTOLOGY - PAP
Comment: NEGATIVE
Diagnosis: NEGATIVE
High risk HPV: NEGATIVE

## 2022-03-18 ENCOUNTER — Ambulatory Visit: Payer: BC Managed Care – PPO | Attending: Cardiology | Admitting: Cardiology

## 2022-03-18 ENCOUNTER — Other Ambulatory Visit
Admission: RE | Admit: 2022-03-18 | Discharge: 2022-03-18 | Disposition: A | Discharge status: Home / Self Care | Payer: BC Managed Care – PPO | Source: Ambulatory Visit | Attending: Cardiology | Admitting: Cardiology

## 2022-03-18 ENCOUNTER — Ambulatory Visit: Payer: BC Managed Care – PPO | Admitting: Cardiology

## 2022-03-18 ENCOUNTER — Ambulatory Visit (INDEPENDENT_AMBULATORY_CARE_PROVIDER_SITE_OTHER): Payer: BC Managed Care – PPO

## 2022-03-18 ENCOUNTER — Encounter: Payer: Self-pay | Admitting: Cardiology

## 2022-03-18 VITALS — BP 100/72 | HR 73 | Ht 63.0 in | Wt 133.6 lb

## 2022-03-18 DIAGNOSIS — E781 Pure hyperglyceridemia: Secondary | ICD-10-CM | POA: Diagnosis not present

## 2022-03-18 DIAGNOSIS — R072 Precordial pain: Secondary | ICD-10-CM | POA: Diagnosis not present

## 2022-03-18 DIAGNOSIS — R002 Palpitations: Secondary | ICD-10-CM | POA: Diagnosis not present

## 2022-03-18 DIAGNOSIS — I10 Essential (primary) hypertension: Secondary | ICD-10-CM

## 2022-03-18 LAB — BASIC METABOLIC PANEL
Anion gap: 6 (ref 5–15)
BUN: 25 mg/dL — ABNORMAL HIGH (ref 6–20)
CO2: 26 mmol/L (ref 22–32)
Calcium: 9.8 mg/dL (ref 8.9–10.3)
Chloride: 107 mmol/L (ref 98–111)
Creatinine, Ser: 0.87 mg/dL (ref 0.44–1.00)
GFR, Estimated: >60 mL/min (ref >60)
Glucose, Bld: 92 mg/dL (ref 70–99)
Potassium: 3.8 mmol/L (ref 3.5–5.1)
Sodium: 139 mmol/L (ref 135–145)

## 2022-03-18 MED ORDER — METOPROLOL TARTRATE 100 MG PO TABS: 100.0000 mg | ORAL_TABLET | Freq: Once | ORAL | 0 refills | Status: DC

## 2022-03-18 MED ORDER — IVABRADINE HCL 5 MG PO TABS: 10.0000 mg | ORAL_TABLET | Freq: Once | ORAL | 0 refills | Status: AC

## 2022-03-18 NOTE — Progress Notes (Signed)
Cardiology Office Note:    Date:  03/18/2022   ID:  Anna [name], DOB 25-Apr-1966, MRN [id number]  PCP:  Roe RutherfordKeatts, Courtney, NP    [facility] Providers Cardiologist:  Debbe OdeaBrian Agbor-Etang, MD     Referring MD: Roe RutherfordKeatts, Courtney, NP   Chief Complaint  Patient presents with   New Patient (Initial Visit)    Intermittent palpitations(happens daily/every other day), chest pain   Anna StallSusan M [name] is a 56 y.o. female who is being seen today for the evaluation of palpitations at the request of Roe RutherfordKeatts, Courtney, NP.   History of Present Illness:    Anna StallSusan M [name] is a 56 y.o. female with a hx of hypertension, hypertriglyceridemia, former smoker x25+ years who presents due to palpitations and chest pain.  Symptoms of palpitations have been ongoing over the past 6 months.  Symptoms usually are associated with exertion such as going upstairs.  States quitting smoking 4 years ago.  Mother had heart attack at age 57.  Also complains of occasional chest pressure not associated with exertion.  Blood pressure adequately controlled on current BP meds.  Past Medical History:  Diagnosis Date   Allergy    Anemia    Anxiety    Arthritis    lower back   Bilateral low back pain with right-sided sciatica    Depression    Hyperlipidemia    No pertinent past medical history    Tobacco abuse 07/18/2016    Past Surgical History:  Procedure Laterality Date   CATARACT EXTRACTION, BILATERAL     COLONOSCOPY  01/10/2012   Procedure: COLONOSCOPY;  Surgeon: Dalia HeadingMark A Jenkins, MD;  [facility];  Service: Gastroenterology;  Laterality: N/A;   CYST EXCISION Right    hand   DILATION AND CURETTAGE OF UTERUS     Ablation   FOOT SURGERY Left    RT HAND SURGERY     SPINAL FUSION     TONSILLECTOMY      Current Medications: Current Meds  Medication Sig   busPIRone (BUSPAR) 10 MG tablet Take 1 tablet by mouth 2 (two) times daily.   cholecalciferol (VITAMIN D3) 25 MCG (1000 UNIT) tablet Take 2,000  Units by mouth daily.   diclofenac Sodium (VOLTAREN) 1 % GEL Apply 2 g topically 4 (four) times daily.   Estradiol 10 MCG TABS vaginal tablet Place 1 tablet (10 mcg total) vaginally every other day. Every other night   fenofibrate 160 MG tablet Take 160 mg by mouth daily.   ibuprofen (ADVIL,MOTRIN) 200 MG tablet Take 400 mg by mouth every 6 (six) hours as needed. For pain   ivabradine (CORLANOR) 5 MG TABS tablet Take 2 tablets (10 mg total) by mouth once for 1 dose. Take 2 hours prior to your CT Scan.   loratadine (CLARITIN) 10 MG tablet Take 10 mg by mouth daily.   losartan (COZAAR) 25 MG tablet Take 25 mg by mouth daily.   metoprolol tartrate (LOPRESSOR) 100 MG tablet Take 1 tablet (100 mg total) by mouth once for 1 dose. Take 2 hours prior to your CT scan.   Multiple Vitamins-Minerals (OCUVITE PRESERVISION PO) Take 1 tablet by mouth daily.   tiZANidine (ZANAFLEX) 4 MG tablet Take 4 mg by mouth every 8 (eight) hours as needed.   traZODone (DESYREL) 100 MG tablet Take 100 mg by mouth at bedtime.   [DISCONTINUED] triamterene-hydrochlorothiazide (DYAZIDE) 37.5-25 MG capsule Take 1 each (1 capsule total) by mouth daily.     Allergies:   Penicillins,  Statins, and Wellbutrin [bupropion]   Social History   Socioeconomic History   Marital status: Divorced    Spouse name: Not on file   Number of children: 2   Years of education: 14   Highest education level: Not on file  Occupational History   Occupation: team leader    Comment: [employer]  Tobacco Use   Smoking status: Former    Packs/day: 0.50    Types: Cigarettes    Start date: 05/09/1984    Quit date: 08/09/2016    Years since quitting: 5.6   Smokeless tobacco: Never  Vaping Use   Vaping Use: Never used  Substance and Sexual Activity   Alcohol use: Yes    Alcohol/week: 0.0 standard drinks of alcohol    Comment: once every few months   Drug use: No   Sexual activity: Yes    Birth control/protection: Surgical    Comment:  ablation, tubal  Other Topics Concern   Not on file  Social History Narrative   Lives alone   Two grown daughters   Significant friend - 1 1/2 years   Loves to walk   Social Determinants of Health   Financial Resource Strain: Low Risk  (09/23/2019)   Overall Financial Resource Strain (CARDIA)    Difficulty of Paying Living Expenses: Not hard at all  Food Insecurity: No Food Insecurity (02/22/2022)   Hunger Vital Sign    Worried About Running Out of Food in the Last Year: Never true    Ran Out of Food in the Last Year: Never true  Transportation Needs: No Transportation Needs (02/22/2022)   PRAPARE - Administrator, Civil Service (Medical): No    Lack of Transportation (Non-Medical): No  Physical Activity: Sufficiently Active (02/22/2022)   Exercise Vital Sign    Days of Exercise per Week: 7 days    Minutes of Exercise per Session: 100 min  Stress: Stress Concern Present (02/22/2022)   [employer] of Occupational Health - Occupational Stress Questionnaire    Feeling of Stress : To some extent  Social Connections: Moderately Isolated (02/22/2022)   Social Connection and Isolation Panel [NHANES]    Frequency of Communication with Friends and Family: More than three times a week    Frequency of Social Gatherings with Friends and Family: Once a week    Attends Religious Services: Never    Database administrator or Organizations: No    Attends Engineer, structural: Never    Marital Status: Living with partner     Family History: The patient's family history includes Arthritis in her father; Breast cancer in her cousin and cousin; Cancer in her father; Diabetes in her mother; Heart attack (age of onset: 44) in her mother; Heart disease in her mother; Hyperlipidemia in her father and mother; Hypertension in her mother; Kidney disease in her mother; Lymphoma in her father.  ROS:   Please see the history of present illness.     All other systems reviewed and  are negative.  EKGs/Labs/Other Studies Reviewed:    The following studies were reviewed today:   EKG:  EKG is  ordered today.  The ekg ordered today demonstrates normal sinus rhythm,  Recent Labs: No results found for requested labs within last 365 days.  Recent Lipid Panel    Component Value Date/Time   CHOL 178 07/18/2016 1021   TRIG 183 (H) 07/18/2016 1021   HDL 51 07/18/2016 1021   CHOLHDL 3.5 07/18/2016 1021  VLDL 37 (H) 07/18/2016 1021   LDLCALC 90 07/18/2016 1021     Risk Assessment/Calculations:             Physical Exam:    VS:  BP 100/72 (BP Location: Right Arm)   Pulse 73   Ht 5\' 3"  (1.6 m)   Wt 133 lb 9.6 oz (60.6 kg)   SpO2 98%   BMI 23.67 kg/m     Wt Readings from Last 3 Encounters:  03/18/22 133 lb 9.6 oz (60.6 kg)  02/22/22 131 lb (59.4 kg)  09/23/19 140 lb 8 oz (63.7 kg)     GEN:  Well nourished, well developed in no acute distress HEENT: Normal NECK: No JVD; No carotid bruits CARDIAC: RRR, no murmurs, rubs, gallops RESPIRATORY:  Clear to auscultation without rales, wheezing or rhonchi  ABDOMEN: Soft, non-tender, non-distended MUSCULOSKELETAL:  No edema; No deformity  SKIN: Warm and dry NEUROLOGIC:  Alert and oriented x 3 PSYCHIATRIC:  Normal affect   ASSESSMENT:    1. Palpitations   2. Precordial pain   3. Primary hypertension   4. Pure hypertriglyceridemia    PLAN:    In order of problems listed above:  Palpitations, place cardiac monitor x2 weeks to evaluate arrhythmias. Chest pain, risk factors former smoker, hypertension, family history of early CAD.  Get echocardiogram, get coronary CTA. Hypertension, BP controlled, continue losartan.  Hold losartan on the day before and day of CT scan. Elevated triglycerides, continue fenofibrate, obtain fasting lipid profile.  Follow-up after cardiac monitor.     Medication Adjustments/Labs and Tests Ordered: Current medicines are reviewed at length with the patient today.  Concerns  regarding medicines are outlined above.  Orders Placed This Encounter  Procedures   CT CORONARY MORPH W/CTA COR W/SCORE W/CA W/CM &/OR WO/CM   Basic metabolic panel   LONG TERM MONITOR (3-14 DAYS)   ECHOCARDIOGRAM COMPLETE   Meds ordered this encounter  Medications   ivabradine (CORLANOR) 5 MG TABS tablet    Sig: Take 2 tablets (10 mg total) by mouth once for 1 dose. Take 2 hours prior to your CT Scan.    Dispense:  2 tablet    Refill:  0   metoprolol tartrate (LOPRESSOR) 100 MG tablet    Sig: Take 1 tablet (100 mg total) by mouth once for 1 dose. Take 2 hours prior to your CT scan.    Dispense:  1 tablet    Refill:  0    Patient Instructions  Medication Instructions:   Your physician recommends that you continue on your current medications as directed. Please refer to the Current Medication list given to you today.  *If you need a refill on your cardiac medications before your next appointment, please call your pharmacy*   Lab Work:  Please go to the [facility] after your appointment today for a BMP lab draw.    Testing/Procedures:  Your physician has requested that you have an echocardiogram. Echocardiography is a painless test that uses sound waves to create images of your heart. It provides your doctor with information about the size and shape of your heart and how well your heart's chambers and valves are working. This procedure takes approximately one hour. There are no restrictions for this procedure. Please do NOT wear cologne, perfume, aftershave, or lotions (deodorant is allowed). Please arrive 15 minutes prior to your appointment time.   2.    Your physician has requested that you have cardiac CT. Cardiac computed tomography (CT) is  a painless test that uses an x-ray machine to take clear, detailed pictures of your heart.    Your cardiac CT will be scheduled at:   [facility] [address] [place], [place] [address] [id number]   Monday 04/04/22 at 8:45 AM   Please arrive 15 mins early for check-in and test prep.   Please follow these instructions carefully (unless otherwise directed):    Night Before the Test: Be sure to Drink plenty of water. Do not consume any caffeinated/decaffeinated beverages or chocolate 12 hours prior to your test.   On the Day of the Test: Drink plenty of water until 1 hour prior to the test. Do not eat any food 4 hours prior to the test. You may take your regular medications prior to the test.  Take metoprolol (Lopressor) two hours prior to test. Take Ivabradine (Corlanor) two hours prior to test. HOLD your Losartan the day before and day of your CT Scan. FEMALES- please wear underwire-free bra if available, avoid dresses & tight clothing       After the Test: Drink plenty of water. After receiving IV contrast, you may experience a mild flushed feeling. This is normal. On occasion, you may experience a mild rash up to 24 hours after the test. This is not dangerous. If this occurs, you can take Benadryl 25 mg and increase your fluid intake. If you experience trouble breathing, this can be serious. If it is severe call 911 IMMEDIATELY. If it is mild, please call our office. If you take any of these medications: Glipizide/Metformin, Avandament, Glucavance, please do not take 48 hours after completing test unless otherwise instructed.  Please allow 2-4 weeks for scheduling of routine cardiac CTs. Some insurance companies require a pre-authorization which may delay scheduling of this test.   For non-scheduling related questions, please contact the cardiac imaging nurse navigator should you have any questions/concerns: Rockwell Alexandria, Cardiac Imaging Nurse Navigator Larey Brick, Cardiac Imaging Nurse Navigator [facility] Direct Office Dial: [phone]   For scheduling needs, including cancellations and rescheduling, please call [place],  [phone].     3.   Your physician has recommended that you wear a Zio XT monitor for 2 weeks. This will be mailed to your home address in 4-5 business days.   Your clinician has requested a Zio heart rhythm monitor by [employer] to be mailed to your home for you to wear for 14 days. You should expect a small box to arrive via USPS (or FedEx in some cases) within this next week. If you do not receive it please call [employer] at [phone].  Closely watching your heart at this time will help your care team understand more and provide information needed to develop your plan of care.  Please apply your Zio patch monitor the day you receive it. Keep this packaging, you will use this to return your Zio monitor.  You will easily be able to apply the monitor with the instructions provided in the Patient Guide.  If you need assistance, [employer] representatives are available 24/7 at [phone].  You can also download the [facility] app on your phone to view detailed application instructions and log symptoms.  After you wear your monitor for 14 days, place it back in the blue box or envelope, along with your Symptom Log.  To send your monitor back: Simply use the pre-addressed and pre-paid box/envelope.  Send it back through [employer] the same day you remove it  via your local post office or by placing it in your mailbox.  As soon as we receive the results, they will be reviewed and your clinician will contact you.  For the first 24 hours- it is essential to not shower or exercise, to allow the patch to adhere to your skin. Avoid excessive sweating to help maximize wear time. Do not submerge the device, no hot tubs, and no swimming pools. Keep any lotions or oils away from the patch. After 24 hours you may shower with the patch on. Take brief showers with your back facing the shower head.  Do not remove patch once it has been placed because that will interrupt data and decrease adhesive wear  time. Push the button when you have any symptoms and write down what you were feeling. Once you have completed wearing your monitor, remove and place into box which has postage paid and place in your outgoing mailbox.  If for some reason you have misplaced your box then call our office and we can provide another box and/or mail it off for you.    Follow-Up: At [facility], you and your health needs are our priority.  As part of our continuing mission to provide you with exceptional heart care, we have created designated Provider Care Teams.  These Care Teams include your primary Cardiologist (physician) and Advanced Practice Providers (APPs -  Physician Assistants and Nurse Practitioners) who all work together to provide you with the care you need, when you need it.  We recommend signing up for the patient portal called "MyChart".  Sign up information is provided on this After Visit Summary.  MyChart is used to connect with patients for Virtual Visits (Telemedicine).  Patients are able to view lab/test results, encounter notes, upcoming appointments, etc.  Non-urgent messages can be sent to your provider as well.   To learn more about what you can do with MyChart, go to [email or web address].    Your next appointment:   6-8 week(s)  The format for your next appointment:   In Person  Provider:   You may see Debbe Odea, MD or one of the following Advanced Practice Providers on your designated Care Team:   Nicolasa Ducking, NPRyan Dunn, PA-C Cadence Fransico Michael, [place] Charlsie Quest, NP    Other Instructions   Important Information About Sugar         Signed, Debbe Odea, MD  03/18/2022 12:41 PM    [facility]

## 2022-03-18 NOTE — Patient Instructions (Signed)
Medication Instructions:   Your physician recommends that you continue on your current medications as directed. Please refer to the Current Medication list given to you today.  *If you need a refill on your cardiac medications before your next appointment, please call your pharmacy*   Lab Work:  Please go to the [facility] after your appointment today for a BMP lab draw.    Testing/Procedures:  Your physician has requested that you have an echocardiogram. Echocardiography is a painless test that uses sound waves to create images of your heart. It provides your doctor with information about the size and shape of your heart and how well your heart's chambers and valves are working. This procedure takes approximately one hour. There are no restrictions for this procedure. Please do NOT wear cologne, perfume, aftershave, or lotions (deodorant is allowed). Please arrive 15 minutes prior to your appointment time.   2.    Your physician has requested that you have cardiac CT. Cardiac computed tomography (CT) is a painless test that uses an x-ray machine to take clear, detailed pictures of your heart.    Your cardiac CT will be scheduled at:   [facility] [address] [place], [place] [address] [phone]   Monday 04/04/22 at 8:45 AM   Please arrive 15 mins early for check-in and test prep.   Please follow these instructions carefully (unless otherwise directed):    Night Before the Test: Be sure to Drink plenty of water. Do not consume any caffeinated/decaffeinated beverages or chocolate 12 hours prior to your test.   On the Day of the Test: Drink plenty of water until 1 hour prior to the test. Do not eat any food 4 hours prior to the test. You may take your regular medications prior to the test.  Take metoprolol (Lopressor) two hours prior to test. Take Ivabradine (Corlanor) two hours prior to test. HOLD your Losartan the day before and  day of your CT Scan. FEMALES- please wear underwire-free bra if available, avoid dresses & tight clothing       After the Test: Drink plenty of water. After receiving IV contrast, you may experience a mild flushed feeling. This is normal. On occasion, you may experience a mild rash up to 24 hours after the test. This is not dangerous. If this occurs, you can take Benadryl 25 mg and increase your fluid intake. If you experience trouble breathing, this can be serious. If it is severe call 911 IMMEDIATELY. If it is mild, please call our office. If you take any of these medications: Glipizide/Metformin, Avandament, Glucavance, please do not take 48 hours after completing test unless otherwise instructed.  Please allow 2-4 weeks for scheduling of routine cardiac CTs. Some insurance companies require a pre-authorization which may delay scheduling of this test.   For non-scheduling related questions, please contact the cardiac imaging nurse navigator should you have any questions/concerns: Rockwell Alexandria, Cardiac Imaging Nurse Navigator Larey Brick, Cardiac Imaging Nurse Navigator [facility] Direct Office Dial: [phone]   For scheduling needs, including cancellations and rescheduling, please call [place], [phone].     3.   Your physician has recommended that you wear a Zio XT monitor for 2 weeks. This will be mailed to your home address in 4-5 business days.   Your clinician has requested a Zio heart rhythm monitor by [employer] to be mailed to your home for you to wear for 14 days. You should expect a small box  to arrive via USPS (or FedEx in some cases) within this next week. If you do not receive it please call [employer] at [phone].  Closely watching your heart at this time will help your care team understand more and provide information needed to develop your plan of care.  Please apply your Zio patch monitor the day you receive it. Keep this  packaging, you will use this to return your Zio monitor.  You will easily be able to apply the monitor with the instructions provided in the Patient Guide.  If you need assistance, [employer] representatives are available 24/7 at [phone].  You can also download the [facility] app on your phone to view detailed application instructions and log symptoms.  After you wear your monitor for 14 days, place it back in the blue box or envelope, along with your Symptom Log.  To send your monitor back: Simply use the pre-addressed and pre-paid box/envelope.  Send it back through [employer] the same day you remove it via your local post office or by placing it in your mailbox.  As soon as we receive the results, they will be reviewed and your clinician will contact you.  For the first 24 hours- it is essential to not shower or exercise, to allow the patch to adhere to your skin. Avoid excessive sweating to help maximize wear time. Do not submerge the device, no hot tubs, and no swimming pools. Keep any lotions or oils away from the patch. After 24 hours you may shower with the patch on. Take brief showers with your back facing the shower head.  Do not remove patch once it has been placed because that will interrupt data and decrease adhesive wear time. Push the button when you have any symptoms and write down what you were feeling. Once you have completed wearing your monitor, remove and place into box which has postage paid and place in your outgoing mailbox.  If for some reason you have misplaced your box then call our office and we can provide another box and/or mail it off for you.    Follow-Up: At [facility], you and your health needs are our priority.  As part of our continuing mission to provide you with exceptional heart care, we have created designated Provider Care Teams.  These Care Teams include your primary Cardiologist (physician) and Advanced Practice Providers (APPs -   Physician Assistants and Nurse Practitioners) who all work together to provide you with the care you need, when you need it.  We recommend signing up for the patient portal called "MyChart".  Sign up information is provided on this After Visit Summary.  MyChart is used to connect with patients for Virtual Visits (Telemedicine).  Patients are able to view lab/test results, encounter notes, upcoming appointments, etc.  Non-urgent messages can be sent to your provider as well.   To learn more about what you can do with MyChart, go to [email or web address].    Your next appointment:   6-8 week(s)  The format for your next appointment:   In Person  Provider:   You may see Debbe Odea, MD or one of the following Advanced Practice Providers on your designated Care Team:   Nicolasa Ducking, NPRyan Dunn, PA-C Cadence Fransico Michael, [place] Charlsie Quest, NP    Other Instructions   Important Information About Sugar

## 2022-03-22 DIAGNOSIS — R002 Palpitations: Secondary | ICD-10-CM | POA: Diagnosis not present

## 2022-03-22 NOTE — Addendum Note (Signed)
Addended by: Enedina Finner on: 03/22/2022 09:35 AM   Modules accepted: Orders

## 2022-04-01 ENCOUNTER — Telehealth (HOSPITAL_COMMUNITY): Payer: Self-pay | Admitting: *Deleted

## 2022-04-01 NOTE — Telephone Encounter (Signed)
Attempted to call patient regarding upcoming cardiac CT appointment. °Left message on voicemail with name and callback number ° °Tyshon Fanning RN Navigator Cardiac Imaging °[facility] °[phone] Office °[phone] Cell ° °

## 2022-04-04 ENCOUNTER — Ambulatory Visit
Admission: RE | Admit: 2022-04-04 | Discharge: 2022-04-04 | Disposition: A | Discharge status: Home / Self Care | Payer: Self-pay | Source: Ambulatory Visit | Attending: Cardiology | Admitting: Cardiology

## 2022-04-04 ENCOUNTER — Ambulatory Visit: Admission: RE | Admit: 2022-04-04 | Payer: BC Managed Care – PPO | Source: Ambulatory Visit

## 2022-04-04 ENCOUNTER — Other Ambulatory Visit: Payer: Self-pay | Admitting: Cardiology

## 2022-04-04 ENCOUNTER — Ambulatory Visit
Admission: RE | Admit: 2022-04-04 | Discharge: 2022-04-04 | Disposition: A | Discharge status: Home / Self Care | Payer: BC Managed Care – PPO | Source: Ambulatory Visit | Attending: Cardiology | Admitting: Cardiology

## 2022-04-04 DIAGNOSIS — R072 Precordial pain: Secondary | ICD-10-CM

## 2022-04-04 DIAGNOSIS — I2510 Atherosclerotic heart disease of native coronary artery without angina pectoris: Secondary | ICD-10-CM

## 2022-04-04 DIAGNOSIS — R0789 Other chest pain: Secondary | ICD-10-CM

## 2022-04-04 HISTORY — DX: Atherosclerotic heart disease of native coronary artery without angina pectoris: I25.10

## 2022-04-04 MED ORDER — IOHEXOL 350 MG/ML SOLN
100.0000 mL | Freq: Once | INTRAVENOUS | Status: AC | PRN
  Administered 2022-04-04: 100 mL via INTRAVENOUS

## 2022-04-04 MED ORDER — NITROGLYCERIN 0.4 MG SL SUBL
0.8000 mg | SUBLINGUAL_TABLET | Freq: Once | SUBLINGUAL | Status: AC
  Administered 2022-04-04: 0.8 mg via SUBLINGUAL
  Filled 2022-04-04: qty 25

## 2022-04-04 NOTE — Progress Notes (Signed)
Patient tolerated procedure well. Ambulate w/o difficulty. Denies any lightheadedness or being dizzy. Pt denies any pain at this time. Sitting in chair, pt is encouraged to drink additional water throughout the day and reason explained to patient. Patient verbalized understanding and all questions answered. ABC intact. No further needs at this time. Discharge from procedure area w/o issues.  

## 2022-04-05 ENCOUNTER — Telehealth: Payer: Self-pay

## 2022-04-05 ENCOUNTER — Encounter: Payer: Self-pay | Admitting: Cardiology

## 2022-04-05 MED ORDER — ASPIRIN 81 MG PO TBEC: 81.0000 mg | DELAYED_RELEASE_TABLET | Freq: Every day | ORAL | 3 refills | Status: AC

## 2022-04-05 NOTE — Telephone Encounter (Signed)
-----   Message from Debbe Odea, MD sent at 04/04/2022  4:47 PM EST ----- Coronary CTA shows moderate LAD disease.  FFR did not show any significant obstruction.  Start aspirin 81 mg daily, Lipitor 20 mg daily.  Obtain fasting lipid profile.  Will consider starting Imdur if chest pain persists.  Keep follow-up appointment.

## 2022-04-05 NOTE — Telephone Encounter (Signed)
Received Lipid results, sent to Dr. Merita Norton basket. Called patient and informed her that I was able to obtain them so she does not have to get another lab draw. Informed her that I would call her back after I receive recommendations from Dr. Azucena Cecil. Patient was grateful for the follow up.

## 2022-04-05 NOTE — Telephone Encounter (Signed)
Called patient an discussed result note. Patient stated that she has tried statins in the past and has not been able to tolerate them. Patient also stated that she has recently had a lipid panel drawn at her PCP's office (Dr. Margo Aye in [place]).   Called primary care office and requested labs to be faxed over. Will have scanned in and routed to Dr. Azucena Cecil.

## 2022-04-07 ENCOUNTER — Other Ambulatory Visit: Payer: BC Managed Care – PPO

## 2022-04-08 ENCOUNTER — Other Ambulatory Visit: Payer: Self-pay

## 2022-04-08 MED ORDER — EZETIMIBE 10 MG PO TABS: 10.0000 mg | ORAL_TABLET | Freq: Every day | ORAL | 3 refills | Status: DC

## 2022-04-08 NOTE — Telephone Encounter (Signed)
Called patient and discussed the following recommendation:  Debbe Odea, MD  Gibson Ramp, RN Start Zetia 10 mg daily due to statin allergies.  Patient verbalized understanding and agreed with plan.

## 2022-04-12 DIAGNOSIS — R002 Palpitations: Secondary | ICD-10-CM | POA: Diagnosis not present

## 2022-05-17 ENCOUNTER — Ambulatory Visit: Payer: BC Managed Care – PPO | Attending: Cardiology

## 2022-05-17 DIAGNOSIS — R072 Precordial pain: Secondary | ICD-10-CM | POA: Diagnosis not present

## 2022-05-17 DIAGNOSIS — I5189 Other ill-defined heart diseases: Secondary | ICD-10-CM

## 2022-05-17 HISTORY — DX: Other ill-defined heart diseases: I51.89

## 2022-05-17 LAB — ECHOCARDIOGRAM COMPLETE
AR max vel: 3.3 cm2
AV Area VTI: 3.59 cm2
AV Area mean vel: 3.15 cm2
AV Mean grad: 2 mmHg
AV Peak grad: 4.6 mmHg
Ao pk vel: 1.07 m/s
Area-P 1/2: 3.39 cm2
Calc EF: 54.2 %
S' Lateral: 2.9 cm
Single Plane A2C EF: 55.9 %
Single Plane A4C EF: 54.6 %

## 2022-05-19 ENCOUNTER — Ambulatory Visit: Payer: BC Managed Care – PPO | Attending: Cardiology | Admitting: Cardiology

## 2022-05-19 ENCOUNTER — Encounter: Payer: Self-pay | Admitting: Cardiology

## 2022-05-19 VITALS — BP 114/68 | HR 74 | Ht 63.0 in | Wt 135.2 lb

## 2022-05-19 DIAGNOSIS — R002 Palpitations: Secondary | ICD-10-CM

## 2022-05-19 DIAGNOSIS — I2510 Atherosclerotic heart disease of native coronary artery without angina pectoris: Secondary | ICD-10-CM | POA: Diagnosis not present

## 2022-05-19 DIAGNOSIS — I10 Essential (primary) hypertension: Secondary | ICD-10-CM | POA: Diagnosis not present

## 2022-05-19 DIAGNOSIS — E782 Mixed hyperlipidemia: Secondary | ICD-10-CM | POA: Diagnosis not present

## 2022-05-19 NOTE — Patient Instructions (Signed)
Medication Instructions:   Your physician recommends that you continue on your current medications as directed. Please refer to the Current Medication list given to you today.  *If you need a refill on your cardiac medications before your next appointment, please call your pharmacy*   Lab Work:  Your physician recommends that you return for lab work in: [facility]. You will need to be fasting.  No appt is needed. Hours are M-F 7AM- 6 PM.  If you have labs (blood work) drawn today and your tests are completely normal, you will receive your results only by: [place] (if you have MyChart) OR A paper copy in the mail If you have any lab test that is abnormal or we need to change your treatment, we will call you to review the results.   Testing/Procedures:  None Ordered   Follow-Up: At [facility], you and your health needs are our priority.  As part of our continuing mission to provide you with exceptional heart care, we have created designated Provider Care Teams.  These Care Teams include your primary Cardiologist (physician) and Advanced Practice Providers (APPs -  Physician Assistants and Nurse Practitioners) who all work together to provide you with the care you need, when you need it.  We recommend signing up for the patient portal called "MyChart".  Sign up information is provided on this After Visit Summary.  MyChart is used to connect with patients for Virtual Visits (Telemedicine).  Patients are able to view lab/test results, encounter notes, upcoming appointments, etc.  Non-urgent messages can be sent to your provider as well.   To learn more about what you can do with MyChart, go to [email or web address].    Your next appointment:   3 month(s)  Provider:   You may see Kate Sable, MD or one of the following Advanced Practice Providers on your designated Care Team:   Murray Hodgkins, NP Christell Faith, PA-C Cadence Kathlen Mody, PA-C Gerrie Nordmann, NP

## 2022-05-19 NOTE — Progress Notes (Signed)
Cardiology Office Note:    Date:  05/19/2022   ID:  Anna [name], DOB 1966/01/09, MRN [id number]  PCP:  Perfecto Kingdom, NP   [facility] Prov[facility] Cardiologist:  Kate Sable, MD     Referring MD: Pablo Lawrence, NP   Chief Complaint  Patient presents with   Follow-up    6-8 weel Testing f/u, no new cardiac concerns     History of Present Illness:    Anna [name] is a 57 y.o. female with a hx of hypertension, hypertriglyceridemia, former smoker x25+ years who presents for follow-up.  Previously seen due to palpitations and chest pain.  Echocardiogram and coronary CTA was obtained to evaluate CAD.  Echo showed normal function.  Coronary CTA did show moderate LAD disease, mild RCA and left circumflex disease.  CT FFR showed no significant stenosis.  Intolerant to statins, started on fenofibrate, Zetia also started.  Tolerating medications with no adverse effects.  No new cardiac concerns at this time.  Prior notes/studies Echo 05/2022 EF 60 to 65% Coronary CTA 03/2019.  Calcium score 1487, moderate proximal LAD disease, mild RCA and left circumflex stenosis.  CT FFR no significant stenosis.  Past Medical History:  Diagnosis Date   Allergy    Anemia    Anxiety    Arthritis    lower back   Bilateral low back pain with right-sided sciatica    Depression    Hyperlipidemia    No pertinent past medical history    Tobacco abuse 07/18/2016    Past Surgical History:  Procedure Laterality Date   CATARACT EXTRACTION, BILATERAL     COLONOSCOPY  01/10/2012   Procedure: COLONOSCOPY;  Surgeon: Jamesetta So, MD;  Location: [facility];  Service: Gastroenterology;  Laterality: N/A;   CYST EXCISION Right    hand   DILATION AND CURETTAGE OF UTERUS     Ablation   FOOT SURGERY Left    RT HAND SURGERY     SPINAL FUSION     TONSILLECTOMY      Current Medications: Current Meds  Medication Sig   aspirin EC 81 MG tablet Take 1 tablet (81 mg total) by mouth daily.  Swallow whole.   cholecalciferol (VITAMIN D3) 25 MCG (1000 UNIT) tablet Take 2,000 Units by mouth daily.   diclofenac Sodium (VOLTAREN) 1 % GEL Apply 2 g topically 4 (four) times daily.   Estradiol 10 MCG TABS vaginal tablet Place 1 tablet (10 mcg total) vaginally every other day. Every other night   ezetimibe (ZETIA) 10 MG tablet Take 1 tablet (10 mg total) by mouth daily.   fenofibrate 160 MG tablet Take 160 mg by mouth daily.   ibuprofen (ADVIL,MOTRIN) 200 MG tablet Take 400 mg by mouth every 6 (six) hours as needed. For pain   loratadine (CLARITIN) 10 MG tablet Take 10 mg by mouth daily.   losartan (COZAAR) 25 MG tablet Take 25 mg by mouth daily.   Multiple Vitamins-Minerals (OCUVITE PRESERVISION PO) Take 1 tablet by mouth daily.   tiZANidine (ZANAFLEX) 4 MG tablet Take 4 mg by mouth every 8 (eight) hours as needed.   traZODone (DESYREL) 100 MG tablet Take 100 mg by mouth at bedtime.     Allergies:   Penicillins, Statins, and Wellbutrin [bupropion]   Social History   Socioeconomic History   Marital status: Divorced    Spouse name: Not on file   Number of children: 2   Years of education: 14   Highest education level:  Not on file  Occupational History   Occupation: team leader    Comment: [employer]  Tobacco Use   Smoking status: Former    Packs/day: 0.50    Types: Cigarettes    Start date: 05/09/1984    Quit date: 08/09/2016    Years since quitting: 5.7   Smokeless tobacco: Never  Vaping Use   Vaping Use: Never used  Substance and Sexual Activity   Alcohol use: Yes    Alcohol/week: 0.0 standard drinks of alcohol    Comment: once every few months   Drug use: No   Sexual activity: Yes    Birth control/protection: Surgical    Comment: ablation, tubal  Other Topics Concern   Not on file  Social History Narrative   Lives alone   Two grown daughters   Significant friend - 1 1/2 years   Loves to walk   Social Determinants of Health   Financial Resource Strain: Low  Risk  (09/23/2019)   Overall Financial Resource Strain (CARDIA)    Difficulty of Paying Living Expenses: Not hard at all  Food Insecurity: No Food Insecurity (02/22/2022)   Hunger Vital Sign    Worried About Running Out of Food in the Last Year: Never true    Ran Out of Food in the Last Year: Never true  Transportation Needs: No Transportation Needs (02/22/2022)   PRAPARE - Administrator, Civil Service (Medical): No    Lack of Transportation (Non-Medical): No  Physical Activity: Sufficiently Active (02/22/2022)   Exercise Vital Sign    Days of Exercise per Week: 7 days    Minutes of Exercise per Session: 100 min  Stress: Stress Concern Present (02/22/2022)   [employer] of Occupational Health - Occupational Stress Questionnaire    Feeling of Stress : To some extent  Social Connections: Moderately Isolated (02/22/2022)   Social Connection and Isolation Panel [NHANES]    Frequency of Communication with Friends and Family: More than three times a week    Frequency of Social Gatherings with Friends and Family: Once a week    Attends Religious Services: Never    Database administrator or Organizations: No    Attends Engineer, structural: Never    Marital Status: Living with partner     Family History: The patient's family history includes Arthritis in her father; Breast cancer in her cousin and cousin; Cancer in her father; Diabetes in her mother; Heart attack (age of onset: 48) in her mother; Heart disease in her mother; Hyperlipidemia in her father and mother; Hypertension in her mother; Kidney disease in her mother; Lymphoma in her father.  ROS:   Please see the history of present illness.     All other systems reviewed and are negative.  EKGs/Labs/Other Studies Reviewed:    The following studies were reviewed today:   EKG:  EKG is  ordered today.  The ekg ordered today demonstrates normal sinus rhythm,  Recent Labs: 03/18/2022: BUN 25; Creatinine,  Ser 0.87; Potassium 3.8; Sodium 139  Recent Lipid Panel    Component Value Date/Time   CHOL 178 07/18/2016 1021   TRIG 183 (H) 07/18/2016 1021   HDL 51 07/18/2016 1021   CHOLHDL 3.5 07/18/2016 1021   VLDL 37 (H) 07/18/2016 1021   LDLCALC 90 07/18/2016 1021   Outside lipid panel 02/2022 total cholesterol 167, triglycerides 54, HDL 55, LDL 101.  Risk Assessment/Calculations:             Physical  Exam:    VS:  BP 114/68 (BP Location: Left Arm, Patient Position: Sitting, Cuff Size: Normal)   Pulse 74   Ht 5\' 3"  (1.6 m)   Wt 135 lb 3.2 oz (61.3 kg)   SpO2 98%   BMI 23.95 kg/m     Wt Readings from Last 3 Encounters:  05/19/22 135 lb 3.2 oz (61.3 kg)  03/18/22 133 lb 9.6 oz (60.6 kg)  02/22/22 131 lb (59.4 kg)     GEN:  Well nourished, well developed in no acute distress HEENT: Normal NECK: No JVD; No carotid bruits CARDIAC: RRR, no murmurs, rubs, gallops RESPIRATORY:  Clear to auscultation without rales, wheezing or rhonchi  ABDOMEN: Soft, non-tender, non-distended MUSCULOSKELETAL:  No edema; No deformity  SKIN: Warm and dry NEUROLOGIC:  Alert and oriented x 3 PSYCHIATRIC:  Normal affect   ASSESSMENT:    1. Coronary artery disease involving native heart, unspecified vessel or lesion type, unspecified whether angina present   2. Primary hypertension   3. Mixed hyperlipidemia   4. Palpitations    PLAN:    In order of problems listed above:  CAD, moderate proximal LAD disease, mild RCA and left circumflex disease.  Calcium score 1487.  No significant stenosis on CT FFR.  Aspirin 81 mg, start Zetia.  Intolerant to statins.   Hypertension, BP controlled, continue losartan.  25 mg daily. Hyperlipidemia, LDL 101, not at goal.  Start Zetia, continue fenofibrate, repeat lipid panel in 3 months. Palpitations, cardiac monitor showed no arrhythmias, overall benign study.  Patient made aware of results, reassured.  Follow-up in 3 months.     Medication Adjustments/Labs  and Tests Ordered: Current medicines are reviewed at length with the patient today.  Concerns regarding medicines are outlined above.  Orders Placed This Encounter  Procedures   Lipid panel   EKG 12-Lead   No orders of the defined types were placed in this encounter.   Patient Instructions  Medication Instructions:   Your physician recommends that you continue on your current medications as directed. Please refer to the Current Medication list given to you today.  *If you need a refill on your cardiac medications before your next appointment, please call your pharmacy*   Lab Work:  Your physician recommends that you return for lab work in: [facility]. You will need to be fasting.  No appt is needed. Hours are M-F 7AM- 6 PM.  If you have labs (blood work) drawn today and your tests are completely normal, you will receive your results only by: MyChart Message (if you have MyChart) OR A paper copy in the mail If you have any lab test that is abnormal or we need to change your treatment, we will call you to review the results.   Testing/Procedures:  None Ordered   Follow-Up: At [facility], you and your health needs are our priority.  As part of our continuing mission to provide you with exceptional heart care, we have created designated Provider Care Teams.  These Care Teams include your primary Cardiologist (physician) and Advanced Practice Providers (APPs -  Physician Assistants and Nurse Practitioners) who all work together to provide you with the care you need, when you need it.  We recommend signing up for the patient portal called "MyChart".  Sign up information is provided on this After Visit Summary.  MyChart is used to connect with patients for Virtual Visits (Telemedicine).  Patients are able to view lab/test results, encounter notes,  upcoming appointments, etc.  Non-urgent messages can be sent to your provider as well.   To learn more about  what you can do with MyChart, go to [email or web address].    Your next appointment:   3 month(s)  Provider:   You may see Kate Sable, MD or one of the following Advanced Practice Providers on your designated Care Team:   Murray Hodgkins, NP Christell Faith, PA-C Cadence Kathlen Mody, PA-C Gerrie Nordmann, NP    Signed, Kate Sable, MD  05/19/2022 1:37 PM    [place]

## 2022-05-31 DIAGNOSIS — R059 Cough, unspecified: Secondary | ICD-10-CM | POA: Diagnosis not present

## 2022-08-01 ENCOUNTER — Other Ambulatory Visit
Admission: RE | Admit: 2022-08-01 | Discharge: 2022-08-01 | Disposition: A | Discharge status: Home / Self Care | Payer: BC Managed Care – PPO | Attending: Cardiology | Admitting: Cardiology

## 2022-08-01 DIAGNOSIS — E782 Mixed hyperlipidemia: Secondary | ICD-10-CM | POA: Insufficient documentation

## 2022-08-01 LAB — LIPID PANEL
Cholesterol: 146 mg/dL (ref 0–200)
HDL: 57 mg/dL (ref >40)
LDL Cholesterol: 72 mg/dL (ref 0–99)
Total CHOL/HDL Ratio: 2.6 RATIO
Triglycerides: 84 mg/dL (ref <150)
VLDL: 17 mg/dL (ref 0–40)

## 2022-08-18 ENCOUNTER — Ambulatory Visit: Payer: BC Managed Care – PPO | Attending: Physician Assistant | Admitting: Cardiology

## 2022-08-18 ENCOUNTER — Encounter: Payer: Self-pay | Admitting: Physician Assistant

## 2022-08-18 VITALS — BP 100/68 | HR 60 | Ht 63.0 in | Wt 137.0 lb

## 2022-08-18 DIAGNOSIS — I10 Essential (primary) hypertension: Secondary | ICD-10-CM | POA: Diagnosis not present

## 2022-08-18 DIAGNOSIS — I2510 Atherosclerotic heart disease of native coronary artery without angina pectoris: Secondary | ICD-10-CM

## 2022-08-18 DIAGNOSIS — R002 Palpitations: Secondary | ICD-10-CM | POA: Diagnosis not present

## 2022-08-18 DIAGNOSIS — E782 Mixed hyperlipidemia: Secondary | ICD-10-CM | POA: Diagnosis not present

## 2022-08-18 MED ORDER — EZETIMIBE 10 MG PO TABS: 10.0000 mg | ORAL_TABLET | Freq: Every day | ORAL | 3 refills | Status: DC

## 2022-08-18 NOTE — Patient Instructions (Signed)
Medication Instructions:  No changes at this time.   *If you need a refill on your cardiac medications before your next appointment, please call your pharmacy*   Lab Work: None  If you have labs (blood work) drawn today and your tests are completely normal, you will receive your results only by: MyChart Message (if you have MyChart) OR A paper copy in the mail If you have any lab test that is abnormal or we need to change your treatment, we will call you to review the results.   Testing/Procedures: None   Follow-Up: At [facility], you and your health needs are our priority.  As part of our continuing mission to provide you with exceptional heart care, we have created designated Provider Care Teams.  These Care Teams include your primary Cardiologist (physician) and Advanced Practice Providers (APPs -  Physician Assistants and Nurse Practitioners) who all work together to provide you with the care you need, when you need it.   Your next appointment:   1 year(s)  Provider:   Debbe Odea, MD or Eula Listen, PA-C

## 2022-08-18 NOTE — Progress Notes (Signed)
Cardiology Office Note:    Date:  08/18/2022   ID:  Anna StallSusan M Stenzel, DOB [birth year], MRN [id number]  PCP:  Cristino MartesHarris, Brandi, NP (Inactive)   [facility] Providers Cardiologist:  Debbe OdeaBrian Agbor-Etang, MD     Referring MD: Cristino MartesHarris, Brandi, NP   CC: follow up for CAD.   History of Present Illness:    Anna StallSusan M [name] is a 57 y.o. female with a hx of nonobstructive CAD, hypertension, former smoker, palpitations, hypertriglyceridemia (intolerant of statin therapy).  Previously followed with [facility] where she establish care with them in 2016 for a family history of early CAD, and near syncopal episodes.  Per their records, she had a negative stress test in 2016, negative event monitor, her symptoms were felt to be related to significant stress stress in her personal life.  It does not appear that she require cardiology services until she establish care with Dr. Azucena CecilAgbor-Etang in November 2023 for palpitations that have been occurring for 6 months.  Symptoms are typically associated with exertion, such as stair use, she also complained of occasional chest pain that was not associated with exertion.  She had a coronary CTA on 04/04/2022 with this calcium score 1487, placing her in the 99th percentile, LAD with calcified plaque proximally causing moderate stenosis 50 to 69%, LCx with calcified plaque proximally causing mild stenosis 25 to 49%.  FFR did not show any significant flow-limiting stenosis.  She wore her a cardiac monitor in December 2023 that was predominantly sinus rhythm, no significant arrhythmias.  Echo on 05/17/2022 revealed an EF of 60 to 65%, grade 1 DD, mildly elevated PASP at 42.4 mmHg, trivial MR.  Most recently she was evaluated in the office on 05/19/2022 by Dr. Azucena CecilAgbor-Etang, she was doing stable from a cardiac perspective, recently started on Zetia and fenofibrate, tolerating well.  She presents today for follow-up of her CAD/palpitations.  She has not noticed any palpitations  "they say I have them, but I do not feel them".  She remains very active at work, sometimes walking 7-8 miles per day while completing her work duties. She checks her BP at home and reports it is typically in the 110-120 range systolic. She denies chest pain, palpitations, dyspnea, pnd, orthopnea, n, v, dizziness, syncope, edema, weight gain, or early satiety.    Past Medical History:  Diagnosis Date   Allergy    Anemia    Anxiety    Arthritis    lower back   Bilateral low back pain with right-sided sciatica    Depression    Hyperlipidemia    No pertinent past medical history    Tobacco abuse 07/18/2016    Past Surgical History:  Procedure Laterality Date   CATARACT EXTRACTION, BILATERAL     COLONOSCOPY  01/10/2012   Procedure: COLONOSCOPY;  Surgeon: Dalia HeadingMark A Jenkins, MD;  Location: [facility];  Service: Gastroenterology;  Laterality: N/A;   CYST EXCISION Right    hand   DILATION AND CURETTAGE OF UTERUS     Ablation   FOOT SURGERY Left    RT HAND SURGERY     SPINAL FUSION     TONSILLECTOMY      Current Medications: Current Meds  Medication Sig   aspirin EC 81 MG tablet Take 1 tablet (81 mg total) by mouth daily. Swallow whole.   busPIRone (BUSPAR) 10 MG tablet Take 1 tablet by mouth 2 (two) times daily.   cholecalciferol (VITAMIN D3) 25 MCG (1000 UNIT) tablet Take 2,000 Units by  mouth daily.   diclofenac Sodium (VOLTAREN) 1 % GEL Apply 2 g topically 4 (four) times daily.   escitalopram (LEXAPRO) 5 MG tablet Take 5 mg by mouth daily.   Estradiol 10 MCG TABS vaginal tablet Place 1 tablet (10 mcg total) vaginally every other day. Every other night   fenofibrate 160 MG tablet Take 160 mg by mouth daily.   ibuprofen (ADVIL,MOTRIN) 200 MG tablet Take 400 mg by mouth every 6 (six) hours as needed. For pain   loratadine (CLARITIN) 10 MG tablet Take 10 mg by mouth daily.   losartan (COZAAR) 25 MG tablet Take 25 mg by mouth daily.   Multiple Vitamins-Minerals (OCUVITE PRESERVISION  PO) Take 1 tablet by mouth daily.   tiZANidine (ZANAFLEX) 4 MG tablet Take 4 mg by mouth every 8 (eight) hours as needed.   traZODone (DESYREL) 100 MG tablet Take 100 mg by mouth at bedtime.   UNABLE TO FIND Take 1 capsule by mouth at bedtime. Med Name: Tumeric   [DISCONTINUED] ezetimibe (ZETIA) 10 MG tablet Take 1 tablet (10 mg total) by mouth daily.     Allergies:   Penicillins, Statins, and Wellbutrin [bupropion]   Social History   Socioeconomic History   Marital status: Divorced    Spouse name: Not on file   Number of children: 2   Years of education: 14   Highest education level: Not on file  Occupational History   Occupation: team leader    Comment: [employer]  Tobacco Use   Smoking status: Former    Packs/day: .5    Types: Cigarettes    Start date: 05/09/1984    Quit date: 08/09/2016    Years since quitting: 6.0   Smokeless tobacco: Never  Vaping Use   Vaping Use: Never used  Substance and Sexual Activity   Alcohol use: Yes    Alcohol/week: 0.0 standard drinks of alcohol    Comment: once every few months   Drug use: No   Sexual activity: Yes    Birth control/protection: Surgical    Comment: ablation, tubal  Other Topics Concern   Not on file  Social History Narrative   Lives alone   Two grown daughters   Significant friend - 1 1/2 years   Loves to walk   Social Determinants of Health   Financial Resource Strain: Low Risk  (09/23/2019)   Overall Financial Resource Strain (CARDIA)    Difficulty of Paying Living Expenses: Not hard at all  Food Insecurity: No Food Insecurity (02/22/2022)   Hunger Vital Sign    Worried About Running Out of Food in the Last Year: Never true    Ran Out of Food in the Last Year: Never true  Transportation Needs: No Transportation Needs (02/22/2022)   PRAPARE - Administrator, Civil Service (Medical): No    Lack of Transportation (Non-Medical): No  Physical Activity: Sufficiently Active (02/22/2022)   Exercise Vital  Sign    Days of Exercise per Week: 7 days    Minutes of Exercise per Session: 100 min  Stress: Stress Concern Present (02/22/2022)   [employer] of Occupational Health - Occupational Stress Questionnaire    Feeling of Stress : To some extent  Social Connections: Moderately Isolated (02/22/2022)   Social Connection and Isolation Panel [NHANES]    Frequency of Communication with Friends and Family: More than three times a week    Frequency of Social Gatherings with Friends and Family: Once a week    Attends Religious  Services: Never    Database administrator or Organizations: No    Attends Engineer, structural: Never    Marital Status: Living with partner     Family History: The patient's family history includes Arthritis in her father; Breast cancer in her cousin and cousin; Cancer in her father; Diabetes in her mother; Heart attack (age of onset: 63) in her mother; Heart disease in her mother; Hyperlipidemia in her father and mother; Hypertension in her mother; Kidney disease in her mother; Lymphoma in her father.  ROS:   Please see the history of present illness.     All other systems reviewed and are negative.  EKGs/Labs/Other Studies Reviewed:    The following studies were reviewed today: Cardiac Studies & Procedures       ECHOCARDIOGRAM  ECHOCARDIOGRAM COMPLETE 05/17/2022  Narrative ECHOCARDIOGRAM REPORT    Patient Name:   Anna [name] Date of Exam: 05/17/2022 Medical Rec #:  [id number]    Height:       63.0 in Accession #:    [id number]   Weight:       133.6 lb Date of Birth:  1965-11-10    BSA:          1.629 m Patient Age:    56 years     BP:           100/72 mmHg Patient Gender: F            HR:           78 bpm. Exam Location:  [place]  Procedure: 2D Echo, Cardiac Doppler, Color Doppler and Strain Analysis  Indications:    R07.9* Chest pain, unspecified  History:        Patient has no prior history of Echocardiogram  examinations. Signs/Symptoms:Chest Pain; Risk Factors:Former Smoker, Hypertension, Dyslipidemia and Family History of Coronary Artery Disease.  Sonographer:    Quentin Ore RDMS, RVT, RDCS Referring Phys: [id number] BRIAN AGBOR-ETANG  IMPRESSIONS   1. Left ventricular ejection fraction, by estimation, is 60 to 65%. The left ventricle has normal function. The left ventricle has no regional wall motion abnormalities. Left ventricular diastolic parameters are consistent with Grade I diastolic dysfunction (impaired relaxation). The average left ventricular global longitudinal strain is -20.9 %. 2. Right ventricular systolic function is normal. The right ventricular size is normal. There is mildly elevated pulmonary artery systolic pressure. The estimated right ventricular systolic pressure is 42.2 mmHg. 3. The mitral valve is normal in structure. Trivial mitral valve regurgitation. No evidence of mitral stenosis. 4. The aortic valve is normal in structure. Aortic valve regurgitation is not visualized. No aortic stenosis is present. 5. The inferior vena cava is normal in size with greater than 50% respiratory variability, suggesting right atrial pressure of 3 mmHg.  FINDINGS Left Ventricle: Left ventricular ejection fraction, by estimation, is 60 to 65%. The left ventricle has normal function. The left ventricle has no regional wall motion abnormalities. The average left ventricular global longitudinal strain is -20.9 %. The left ventricular internal cavity size was normal in size. There is no left ventricular hypertrophy. Left ventricular diastolic parameters are consistent with Grade I diastolic dysfunction (impaired relaxation).  Right Ventricle: The right ventricular size is normal. No increase in right ventricular wall thickness. Right ventricular systolic function is normal. There is mildly elevated pulmonary artery systolic pressure. The tricuspid regurgitant velocity is 3.05 m/s, and with an  assumed right atrial pressure of 5 mmHg, the estimated right ventricular systolic pressure is  42.2 mmHg.  Left Atrium: Left atrial size was normal in size.  Right Atrium: Right atrial size was normal in size.  Pericardium: There is no evidence of pericardial effusion.  Mitral Valve: The mitral valve is normal in structure. Trivial mitral valve regurgitation. No evidence of mitral valve stenosis.  Tricuspid Valve: The tricuspid valve is normal in structure. Tricuspid valve regurgitation is mild . No evidence of tricuspid stenosis.  Aortic Valve: The aortic valve is normal in structure. Aortic valve regurgitation is not visualized. No aortic stenosis is present. Aortic valve mean gradient measures 2.0 mmHg. Aortic valve peak gradient measures 4.6 mmHg. Aortic valve area, by VTI measures 3.59 cm.  Pulmonic Valve: The pulmonic valve was normal in structure. Pulmonic valve regurgitation is not visualized. No evidence of pulmonic stenosis.  Aorta: The aortic root is normal in size and structure.  Venous: The inferior vena cava is normal in size with greater than 50% respiratory variability, suggesting right atrial pressure of 3 mmHg.  IAS/Shunts: No atrial level shunt detected by color flow Doppler.   LEFT VENTRICLE PLAX 2D LVIDd:         4.50 cm     Diastology LVIDs:         2.90 cm     LV e' medial:    6.20 cm/s LV PW:         1.10 cm     LV E/e' medial:  9.5 LV IVS:        0.80 cm     LV e' lateral:   7.40 cm/s LVOT diam:     2.10 cm     LV E/e' lateral: 7.9 LV SV:         71 LV SV Index:   44          2D Longitudinal Strain LVOT Area:     3.46 cm    2D Strain GLS Avg:     -20.9 %  LV Volumes (MOD) LV vol d, MOD A2C: 79.7 ml LV vol d, MOD A4C: 86.9 ml LV vol s, MOD A2C: 35.2 ml LV vol s, MOD A4C: 39.4 ml LV SV MOD A2C:     44.6 ml LV SV MOD A4C:     86.9 ml LV SV MOD BP:      45.4 ml  RIGHT VENTRICLE             IVC RV Basal diam:  3.40 cm     IVC diam: 1.30 cm RV S  prime:     12.60 cm/s TAPSE (M-mode): 3.2 cm  LEFT ATRIUM             Index        RIGHT ATRIUM           Index LA diam:        3.60 cm 2.21 cm/m   RA Area:     15.40 cm LA Vol (A2C):   28.4 ml 17.43 ml/m  RA Volume:   40.50 ml  24.86 ml/m LA Vol (A4C):   29.6 ml 18.17 ml/m LA Biplane Vol: 29.1 ml 17.86 ml/m AORTIC VALVE                    PULMONIC VALVE AV Area (Vmax):    3.30 cm     PV Vmax:       0.81 m/s AV Area (Vmean):   3.15 cm     PV Peak grad:  2.6 mmHg AV Area (  VTI):     3.59 cm AV Vmax:           107.00 cm/s AV Vmean:          70.600 cm/s AV VTI:            0.198 m AV Peak Grad:      4.6 mmHg AV Mean Grad:      2.0 mmHg LVOT Vmax:         102.00 cm/s LVOT Vmean:        64.300 cm/s LVOT VTI:          0.205 m LVOT/AV VTI ratio: 1.04  AORTA Ao Root diam: 3.20 cm Ao Asc diam:  3.20 cm Ao Arch diam: 2.2 cm  MITRAL VALVE               TRICUSPID VALVE MV Area (PHT): 3.39 cm    TR Peak grad:   37.2 mmHg MV Decel Time: 224 msec    TR Vmax:        305.00 cm/s MV E velocity: 58.70 cm/s MV A velocity: 67.30 cm/s  SHUNTS MV E/A ratio:  0.87        Systemic VTI:  0.20 m Systemic Diam: 2.10 cm  Julien Nordmann MD Electronically signed by Julien Nordmann MD Signature Date/Time: 05/17/2022/3:54:47 PM    Final    MONITORS  LONG TERM MONITOR (3-14 DAYS) 04/14/2022  Narrative Patch Wear Time:  12 days and 2 hours (2023-11-14T17:28:59-0500 to 2023-11-26T20:23:34-0500)  Patient had a min HR of 53 bpm, max HR of 153 bpm, and avg HR of 81 bpm. Predominant underlying rhythm was Sinus Rhythm. Isolated SVEs were rare (<1.0%), SVE Couplets were rare (<1.0%), and SVE Triplets were rare (<1.0%). Isolated VEs were rare (<1.0%), VE Couplets were rare (<1.0%), and no VE Triplets were present.  Conclusion Benign cardiac monitor with no significant arrhythmias. Patient triggered events associated with sinus rhythm with sinus tachycardia.   CT SCANS  CT CORONARY MORPH W/CTA COR  W/SCORE 04/04/2022  Addendum 04/04/2022 11:23 AM ADDENDUM REPORT: 04/04/2022 11:21  EXAM: OVER-READ INTERPRETATION  CT CHEST  The following report is an over-read performed by radiologist Dr. Alver Fisher [facility] on 04/04/2022. This over-read does not include interpretation of cardiac or coronary anatomy or pathology. The interpretation by the cardiologist is attached.  COMPARISON:  None.  FINDINGS: Scout view is unremarkable.  No acute extracardiac findings.  IMPRESSION: No acute extracardiac findings.   Electronically Signed By: Leanna Battles M.D. On: 04/04/2022 11:21  Narrative CLINICAL DATA:  Chest pain  EXAM: Cardiac/Coronary  CTA  TECHNIQUE: The patient was scanned on a [employer] scanner.  : A retrospective scan was triggered in the ascending thoracic aorta. Axial non-contrast 3 mm slices were carried out through the heart. The data set was analyzed on a dedicated work station and scored using the Agatson method. Gantry rotation speed was 66 msecs and collimation was .6 mm. 100mg  of metoprolol and 0.8 mg of sl NTG was given. The 3D data set was reconstructed in 5% intervals of the 60-95 % of the R-R cycle. Diastolic phases were analyzed on a dedicated work station using MPR, MIP and VRT modes. The patient received 75 cc of contrast.  FINDINGS: Aorta:  Normal size.  No calcifications.  No dissection.  Aortic Valve:  Trileaflet.  No calcifications.  Coronary Arteries:  Normal coronary origin.  Right dominance.  RCA is a dominant artery that gives rise to PDA and PLA.  There is calcified plaque in the proximal RCA causing mild stenosis.  Left main gives rise to LAD and LCX arteries.  LM has no stenosis  LAD has calcified plaque proximally causing moderate stenosis (50-69%).  LCX is a non-dominant artery that gives rise to two obtuse marginal branches. There is calcified plaque proximally causing mild  stenosis (25-49%).  Other findings:  Normal pulmonary vein drainage into the left atrium.  Normal left atrial appendage without a thrombus.  Normal size of the pulmonary artery.  IMPRESSION: 1. Coronary calcium score of 1487. This was 99th percentile for age and sex matched control.  2. Normal coronary origin with right dominance.  3. Calcified plaque in the proximal LAD causing moderate stenosis (50-69%).  4. Mild RCA and LCx stenosis (25-49%).  5. CAD-RADS 3. Moderate stenosis. Consider symptom-guided anti-ischemic pharmacotherapy as well as risk factor modification per guideline directed care.  6. Additional analysis with CT FFR will be submitted and reported separately.  Electronically Signed: By: Debbe Odea M.D. On: 04/04/2022 09:43           EKG:  EKG is not ordered today.   Recent Labs: 03/18/2022: BUN 25; Creatinine, Ser 0.87; Potassium 3.8; Sodium 139  Recent Lipid Panel    Component Value Date/Time   CHOL 146 08/01/2022 0713   TRIG 84 08/01/2022 0713   HDL 57 08/01/2022 0713   CHOLHDL 2.6 08/01/2022 0713   VLDL 17 08/01/2022 0713   LDLCALC 72 08/01/2022 0713     Risk Assessment/Calculations:                Physical Exam:    VS:  BP 100/68 (BP Location: Left Arm, Patient Position: Sitting, Cuff Size: Normal)   Pulse 60   Ht 5\' 3"  (1.6 m)   Wt 137 lb (62.1 kg)   SpO2 99%   BMI 24.27 kg/m     Wt Readings from Last 3 Encounters:  08/18/22 137 lb (62.1 kg)  05/19/22 135 lb 3.2 oz (61.3 kg)  03/18/22 133 lb 9.6 oz (60.6 kg)     GEN:  Well nourished, well developed in no acute distress HEENT: Normal NECK: No JVD; No carotid bruits LYMPHATICS: No lymphadenopathy CARDIAC: RRR, no murmurs, rubs, gallops RESPIRATORY:  Clear to auscultation without rales, wheezing or rhonchi  ABDOMEN: Soft, non-tender, non-distended MUSCULOSKELETAL:  No edema; No deformity  SKIN: Warm and dry NEUROLOGIC:  Alert and oriented x 3 PSYCHIATRIC:   Normal affect   ASSESSMENT:    1. Coronary artery disease involving native heart, unspecified vessel or lesion type, unspecified whether angina present   2. Primary hypertension   3. Mixed hyperlipidemia   4. Palpitations    PLAN:    In order of problems listed above:  CAD -s/p coronary CTA with FFR revealed LAD with calcified plaque proximally causing moderate stenosis 50 to 69%, LCx with calcified plaque proximally causing mild stenosis 25 to 49%.  FFR did not show any significant flow-limiting stenosis. Stable with no anginal symptoms. No indication for ischemic evaluation.  Heart healthy diet and regular cardiovascular exercise encouraged.   She is intolerant of statins, continue Zetia 10 mg daily, fenofibrate 160 mg daily, aspirin 81 mg daily.   Hypertension - BP today is 100/68. BP at home is typically ~ 110-120 systolic. Denies dizziness or ortho stasis. Could consider decreasing her losartan to 12.5 mg daily if symptoms arise.  Hyperlipidemia -repeat LDL on 08/01/2022 was 72, continue Zetia, continue bempedoic acid.  Palpitations -recent monitor did  not reveal any arrhythmias, overall reassuring. Quiescent.   Disposition - return in 1 year.            Medication Adjustments/Labs and Tests Ordered: Current medicines are reviewed at length with the patient today.  Concerns regarding medicines are outlined above.  No orders of the defined types were placed in this encounter.  Meds ordered this encounter  Medications   ezetimibe (ZETIA) 10 MG tablet    Sig: Take 1 tablet (10 mg total) by mouth daily.    Dispense:  90 tablet    Refill:  3    Patient Instructions  Medication Instructions:  No changes at this time.   *If you need a refill on your cardiac medications before your next appointment, please call your pharmacy*   Lab Work: None  If you have labs (blood work) drawn today and your tests are completely normal, you will receive your results only by: MyChart  Message (if you have MyChart) OR A paper copy in the mail If you have any lab test that is abnormal or we need to change your treatment, we will call you to review the results.   Testing/Procedures: None   Follow-Up: At [facility], you and your health needs are our priority.  As part of our continuing mission to provide you with exceptional heart care, we have created designated Provider Care Teams.  These Care Teams include your primary Cardiologist (physician) and Advanced Practice Providers (APPs -  Physician Assistants and Nurse Practitioners) who all work together to provide you with the care you need, when you need it.   Your next appointment:   1 year(s)  Provider:   Debbe Odea, MD or Eula Listen, PA-C     Signed, Flossie Dibble, NP  08/18/2022 4:34 PM     [facility]

## 2022-09-26 ENCOUNTER — Ambulatory Visit: Payer: BC Managed Care – PPO | Admitting: Family

## 2022-09-26 ENCOUNTER — Encounter: Payer: Self-pay | Admitting: Family

## 2022-09-26 ENCOUNTER — Telehealth: Payer: Self-pay | Admitting: Family

## 2022-09-26 VITALS — BP 116/72 | HR 81 | Temp 98.2°F | Ht 63.0 in | Wt 135.0 lb

## 2022-09-26 DIAGNOSIS — I2510 Atherosclerotic heart disease of native coronary artery without angina pectoris: Secondary | ICD-10-CM

## 2022-09-26 DIAGNOSIS — Z789 Other specified health status: Secondary | ICD-10-CM | POA: Insufficient documentation

## 2022-09-26 DIAGNOSIS — Z780 Asymptomatic menopausal state: Secondary | ICD-10-CM

## 2022-09-26 DIAGNOSIS — I10 Essential (primary) hypertension: Secondary | ICD-10-CM | POA: Diagnosis not present

## 2022-09-26 DIAGNOSIS — Z87891 Personal history of nicotine dependence: Secondary | ICD-10-CM | POA: Diagnosis not present

## 2022-09-26 DIAGNOSIS — R002 Palpitations: Secondary | ICD-10-CM | POA: Diagnosis not present

## 2022-09-26 DIAGNOSIS — E782 Mixed hyperlipidemia: Secondary | ICD-10-CM

## 2022-09-26 DIAGNOSIS — M791 Myalgia, unspecified site: Secondary | ICD-10-CM

## 2022-09-26 DIAGNOSIS — F411 Generalized anxiety disorder: Secondary | ICD-10-CM

## 2022-09-26 DIAGNOSIS — G8929 Other chronic pain: Secondary | ICD-10-CM

## 2022-09-26 DIAGNOSIS — M5441 Lumbago with sciatica, right side: Secondary | ICD-10-CM | POA: Diagnosis not present

## 2022-09-26 DIAGNOSIS — G479 Sleep disorder, unspecified: Secondary | ICD-10-CM

## 2022-09-26 DIAGNOSIS — F32A Depression, unspecified: Secondary | ICD-10-CM

## 2022-09-26 DIAGNOSIS — Z1231 Encounter for screening mammogram for malignant neoplasm of breast: Secondary | ICD-10-CM

## 2022-09-26 HISTORY — DX: Essential (primary) hypertension: I10

## 2022-09-26 LAB — CBC
HCT: 35.7 % — ABNORMAL LOW (ref 36.0–46.0)
Hemoglobin: 12.3 g/dL (ref 12.0–15.0)
MCHC: 34.5 g/dL (ref 30.0–36.0)
MCV: 89.5 fl (ref 78.0–100.0)
Platelets: 317 10*3/uL (ref 150.0–400.0)
RBC: 3.99 Mil/uL (ref 3.87–5.11)
RDW: 12.8 % (ref 11.5–15.5)
WBC: 6.1 10*3/uL (ref 4.0–10.5)

## 2022-09-26 LAB — COMPREHENSIVE METABOLIC PANEL
ALT: 18 U/L (ref 0–35)
AST: 20 U/L (ref 0–37)
Albumin: 4.6 g/dL (ref 3.5–5.2)
Alkaline Phosphatase: 51 U/L (ref 39–117)
BUN: 21 mg/dL (ref 6–23)
CO2: 27 mEq/L (ref 19–32)
Calcium: 10.2 mg/dL (ref 8.4–10.5)
Chloride: 106 mEq/L (ref 96–112)
Creatinine, Ser: 0.8 mg/dL (ref 0.40–1.20)
GFR: 81.36 mL/min (ref >60.00)
Glucose, Bld: 87 mg/dL (ref 70–99)
Potassium: 4 mEq/L (ref 3.5–5.1)
Sodium: 142 mEq/L (ref 135–145)
Total Bilirubin: 0.5 mg/dL (ref 0.2–1.2)
Total Protein: 7 g/dL (ref 6.0–8.3)

## 2022-09-26 LAB — MICROALBUMIN / CREATININE URINE RATIO
Creatinine,U: 209.5 mg/dL
Microalb Creat Ratio: 0.8 mg/g (ref 0.0–30.0)
Microalb, Ur: 1.6 mg/dL (ref 0.0–1.9)

## 2022-09-26 MED ORDER — TRAZODONE HCL 100 MG PO TABS: 100.0000 mg | ORAL_TABLET | Freq: Every evening | ORAL | 3 refills | Status: DC

## 2022-09-26 MED ORDER — CITALOPRAM HYDROBROMIDE 10 MG PO TABS
10.0000 mg | ORAL_TABLET | Freq: Every day | ORAL | 0 refills | Status: DC
Start: 2022-09-26 — End: 2023-03-29

## 2022-09-26 NOTE — Progress Notes (Signed)
New Patient Office Visit  Subjective:  Patient ID: Anna [name], female    DOB: 01/08/1966  Age: 57 y.o. MRN: [id number]  CC:  Chief Complaint  Patient presents with   Establish Care    HPI Anna [name] is here to establish care as a new patient.  Oriented to practice routines and expectations.  Prior provider was:  in [place], looking to look for a closer location.   Pt is with acute concerns.   chronic concerns:  HTN: losartan 25 mg once daily   Allergies: taking allergy medication about four times a week.   HLD: on fenofibrate and taking fish oils. Also on zetia 10 mg once daily.  Lab Results  Component Value Date   CHOL 146 08/01/2022   HDL 57 08/01/2022   LDLCALC 72 08/01/2022   TRIG 84 08/01/2022   CHOLHDL 2.6 08/01/2022   Anxiety with depression: stopped taking lexapro because she had decreased sexual libido. She does state still with concerns at work, and at times she will have to climb a step ladder. She will get up there and will have worry she is going to fall and or have a panic attack where she has to get down. She also states she drove to [place] a couple of months ago, one of which she has driven before, and she states when she went thorough a tunnel she almost passed out from anxiety. Had to work on deep breaths to get through.   Sleep disturbance, 100 mg nightly, helps at times.   PMP, low estrogen: estradiol 10 mcg tablets vaginally.       ROS: Negative unless specifically indicated above in HPI.   Current Outpatient Medications:    aspirin EC 81 MG tablet, Take 1 tablet (81 mg total) by mouth daily. Swallow whole., Disp: 90 tablet, Rfl: 3   cholecalciferol (VITAMIN D3) 25 MCG (1000 UNIT) tablet, Take 2,000 Units by mouth daily., Disp: , Rfl:    citalopram (CELEXA) 10 MG tablet, Take 1 tablet (10 mg total) by mouth daily., Disp: 90 tablet, Rfl: 0   diclofenac Sodium (VOLTAREN) 1 % GEL, Apply 2 g topically 4 (four) times daily., Disp: , Rfl:     Estradiol 10 MCG TABS vaginal tablet, Place 1 tablet (10 mcg total) vaginally every other day. Every other night, Disp: 15 tablet, Rfl: 12   ezetimibe (ZETIA) 10 MG tablet, Take 1 tablet (10 mg total) by mouth daily., Disp: 90 tablet, Rfl: 3   fenofibrate 160 MG tablet, Take 160 mg by mouth daily., Disp: , Rfl:    loratadine (CLARITIN) 10 MG tablet, Take 10 mg by mouth daily., Disp: , Rfl:    losartan (COZAAR) 25 MG tablet, Take 25 mg by mouth daily., Disp: , Rfl:    Multiple Vitamins-Minerals (OCUVITE PRESERVISION PO), Take 1 tablet by mouth daily., Disp: , Rfl:    Omega-3 Fatty Acids (FISH OIL PO), Take by mouth., Disp: , Rfl:    tiZANidine (ZANAFLEX) 4 MG tablet, Take 4 mg by mouth every 8 (eight) hours as needed., Disp: , Rfl:    traZODone (DESYREL) 100 MG tablet, Take 100 mg by mouth at bedtime., Disp: , Rfl:    Turmeric (QC TUMERIC COMPLEX PO), Take 1 tablet by mouth daily., Disp: , Rfl:  Past Medical History:  Diagnosis Date   Allergy    Anemia    Anxiety    Arthritis    lower back   Bilateral low back pain with right-sided sciatica  Depression    Hyperlipidemia    No pertinent past medical history    Primary hypertension 09/26/2022   Sleep disorder 09/26/2022   Tobacco abuse 07/18/2016   Past Surgical History:  Procedure Laterality Date   CATARACT EXTRACTION, BILATERAL     COLONOSCOPY  01/10/2012   Procedure: COLONOSCOPY;  Surgeon: Dalia Heading, MD;  Location: [facility];  Service: Gastroenterology;  Laterality: N/A;   CYST EXCISION Right    hand   DILATION AND CURETTAGE OF UTERUS     Ablation   FOOT SURGERY Left    SPINAL FUSION     TONSILLECTOMY      Objective:   Today's Vitals: BP 116/72   Pulse 81   Temp 98.2 F (36.8 C) (Temporal)   Ht 5\' 3"  (1.6 m)   Wt 135 lb (61.2 kg)   SpO2 99%   BMI 23.91 kg/m   Physical Exam Constitutional:      General: She is not in acute distress.    Appearance: Normal appearance. She is normal weight. She is not  ill-appearing.  HENT:     Head: Normocephalic.     Right Ear: Tympanic membrane normal.     Left Ear: Tympanic membrane normal.     Nose: Nose normal.     Mouth/Throat:     Mouth: Mucous membranes are moist.  Eyes:     Extraocular Movements: Extraocular movements intact.     Pupils: Pupils are equal, round, and reactive to light.  Cardiovascular:     Rate and Rhythm: Normal rate and regular rhythm.  Pulmonary:     Effort: Pulmonary effort is normal.     Breath sounds: Normal breath sounds.  Abdominal:     Palpations: Abdomen is soft.     Tenderness: There is no guarding or rebound.  Musculoskeletal:        General: Normal range of motion.     Cervical back: Normal range of motion.  Skin:    General: Skin is warm.     Capillary Refill: Capillary refill takes less than 2 seconds.  Neurological:     General: No focal deficit present.     Mental Status: She is alert.  Psychiatric:        Mood and Affect: Mood normal.        Behavior: Behavior normal.        Thought Content: Thought content normal.        Judgment: Judgment normal.     Assessment & Plan:  Statin intolerance  Myalgia due to statin  Chronic bilateral low back pain with right-sided sciatica Assessment & Plan: Improved since surgery, very intermittent. Doing well since surgery.    History of tobacco use -     CBC  Screening mammogram for breast cancer -     3D Screening Mammogram, Left and Right; Future  Postmenopausal -     DG Bone Density; Future  Coronary artery disease without angina pectoris, unspecified vessel or lesion type, unspecified whether native or transplanted heart Assessment & Plan: Continue asa 81 mg once daily Continue f/u with cardiology as scheduled.   Palpitations -     CBC -     TSH  GAD (generalized anxiety disorder) Assessment & Plan: Going to start celexa 10 mg once daily  Recommendation made for therapy as well, pt will consider this.   Orders: -     Citalopram  Hydrobromide; Take 1 tablet (10 mg total) by mouth daily.  Dispense: 90  tablet; Refill: 0  Depression, unspecified depression type -     Citalopram Hydrobromide; Take 1 tablet (10 mg total) by mouth daily.  Dispense: 90 tablet; Refill: 0  Primary hypertension Assessment & Plan: Stable  Continue losartan 25 mg once daily   Orders: -     TSH -     Comprehensive metabolic panel -     Microalbumin / creatinine urine ratio  Sleep disorder Assessment & Plan: Continue trazodone 100 mg once daily.    Mixed hyperlipidemia Assessment & Plan: Continue Zetia 10 mg once daily and fenofibrate 160 mg once daily      Follow-up: Return in about 6 months (around 03/29/2023) for f/u blood pressure, f/u cholesterol, f/u anxiety.   Mort Sawyers, FNP

## 2022-09-26 NOTE — Assessment & Plan Note (Signed)
Improved since surgery, very intermittent. Doing well since surgery.

## 2022-09-26 NOTE — Assessment & Plan Note (Signed)
Continue Zetia 10 mg once daily and fenofibrate 160 mg once daily

## 2022-09-26 NOTE — Assessment & Plan Note (Signed)
Going to start celexa 10 mg once daily  Recommendation made for therapy as well, pt will consider this.

## 2022-09-26 NOTE — Assessment & Plan Note (Signed)
Stable  Continue losartan 25 mg once daily

## 2022-09-26 NOTE — Patient Instructions (Addendum)
I have sent an electronic order over to your preferred location for the following:   []   2D Mammogram  [x]   3D Mammogram  [x]   Bone Density   Please give this center a call to get scheduled at your convenience.  [x]   [facility]  [address] [place] [address]  [phone]  Make sure to wear two piece  clothing  No lotions powders or deodorants the day of the appointment Make sure to bring picture ID and insurance card.  Bring list of medications you are currently taking including any supplements.   ------------------------------------   ------------------------------------  Start celexa 10 mg for anxiety and depression. Take 1/2 tablet by mouth once daily for about one week, then increase to 1 full tablet thereafter.   Taking the medicine as directed and not missing any doses is one of the best things you can do to treat your anxiety. Here are some things to keep in mind:  Side effects (stomach upset, some increased anxiety) may happen before you notice a benefit.  These side effects typically go away over time. Changes to your dose of medicine or a change in medication all together is sometimes necessary Many people will notice an improvement within two weeks but the full effect of the medication can take up to 4-6 weeks Stopping the medication when you start feeling better often results in a return of symptoms. Most people need to be on medication at least 6-12 months If you start having thoughts of hurting yourself or others after starting this medicine, please call me immediately.    ------------------------------------

## 2022-09-26 NOTE — Assessment & Plan Note (Signed)
Continue asa 81 mg once daily Continue f/u with cardiology as scheduled.

## 2022-09-26 NOTE — Addendum Note (Signed)
Addended by: Mort Sawyers on: 09/26/2022 01:36 PM   Modules accepted: Orders

## 2022-09-26 NOTE — Assessment & Plan Note (Signed)
Continue trazodone 100 mg once daily.

## 2022-09-26 NOTE — Telephone Encounter (Signed)
Prescription Request  09/26/2022  LOV: 09/26/2022  What is the name of the medication or equipment? traZODone (DESYREL) 100 MG tablet   Have you contacted your pharmacy to request a refill? No   Which pharmacy would you like this sent to?    [facility] [address] [facility] - [place], [place] - [address] AT [facility] [address] [place] [address] Phone: [phone] Fax: [phone]    Patient notified that their request is being sent to the clinical staff for review and that they should receive a response within 2 business days.   Please advise at Mobile [phone] (mobile)

## 2022-09-26 NOTE — Telephone Encounter (Signed)
Medication refilled

## 2022-09-27 LAB — TSH: TSH: 2.46 u[IU]/mL (ref 0.35–5.50)

## 2022-10-23 ENCOUNTER — Emergency Department: Payer: PRIVATE HEALTH INSURANCE

## 2022-10-23 ENCOUNTER — Other Ambulatory Visit: Payer: Self-pay

## 2022-10-23 ENCOUNTER — Emergency Department
Admission: EM | Admit: 2022-10-23 | Discharge: 2022-10-23 | Disposition: A | Discharge status: Home / Self Care | Payer: PRIVATE HEALTH INSURANCE | Attending: Emergency Medicine | Admitting: Emergency Medicine

## 2022-10-23 DIAGNOSIS — K8020 Calculus of gallbladder without cholecystitis without obstruction: Secondary | ICD-10-CM | POA: Diagnosis not present

## 2022-10-23 DIAGNOSIS — K8050 Calculus of bile duct without cholangitis or cholecystitis without obstruction: Secondary | ICD-10-CM | POA: Diagnosis not present

## 2022-10-23 DIAGNOSIS — R1010 Upper abdominal pain, unspecified: Secondary | ICD-10-CM | POA: Diagnosis present

## 2022-10-23 LAB — CBC
HCT: 37.8 % (ref 36.0–46.0)
Hemoglobin: 12.5 g/dL (ref 12.0–15.0)
MCH: 30 pg (ref 26.0–34.0)
MCHC: 33.1 g/dL (ref 30.0–36.0)
MCV: 90.9 fL (ref 80.0–100.0)
Platelets: 317 10*3/uL (ref 150–400)
RBC: 4.16 MIL/uL (ref 3.87–5.11)
RDW: 12 % (ref 11.5–15.5)
WBC: 7.6 10*3/uL (ref 4.0–10.5)
nRBC: 0 % (ref 0.0–0.2)

## 2022-10-23 LAB — COMPREHENSIVE METABOLIC PANEL
ALT: 18 U/L (ref 0–44)
AST: 30 U/L (ref 15–41)
Albumin: 5.1 g/dL — ABNORMAL HIGH (ref 3.5–5.0)
Alkaline Phosphatase: 55 U/L (ref 38–126)
Anion gap: 14 (ref 5–15)
BUN: 23 mg/dL — ABNORMAL HIGH (ref 6–20)
CO2: 20 mmol/L — ABNORMAL LOW (ref 22–32)
Calcium: 11.4 mg/dL — ABNORMAL HIGH (ref 8.9–10.3)
Chloride: 105 mmol/L (ref 98–111)
Creatinine, Ser: 0.96 mg/dL (ref 0.44–1.00)
GFR, Estimated: >60 mL/min (ref >60)
Glucose, Bld: 114 mg/dL — ABNORMAL HIGH (ref 70–99)
Potassium: 3.4 mmol/L — ABNORMAL LOW (ref 3.5–5.1)
Sodium: 139 mmol/L (ref 135–145)
Total Bilirubin: 0.8 mg/dL (ref 0.3–1.2)
Total Protein: 8.2 g/dL — ABNORMAL HIGH (ref 6.5–8.1)

## 2022-10-23 LAB — URINALYSIS, ROUTINE W REFLEX MICROSCOPIC
Bilirubin Urine: NEGATIVE
Glucose, UA: NEGATIVE mg/dL
Hgb urine dipstick: NEGATIVE
Ketones, ur: NEGATIVE mg/dL
Nitrite: NEGATIVE
Protein, ur: NEGATIVE mg/dL
Specific Gravity, Urine: 1.012 (ref 1.005–1.030)
WBC, UA: NONE SEEN WBC/hpf (ref 0–5)
pH: 7 (ref 5.0–8.0)

## 2022-10-23 LAB — LIPASE, BLOOD: Lipase: 35 U/L (ref 11–51)

## 2022-10-23 LAB — POC URINE PREG, ED: Preg Test, Ur: NEGATIVE

## 2022-10-23 MED ORDER — HYDROCODONE-ACETAMINOPHEN 5-325 MG PO TABS: 2.0000 | ORAL_TABLET | Freq: Four times a day (QID) | ORAL | 0 refills | Status: DC | PRN

## 2022-10-23 MED ORDER — LACTATED RINGERS IV BOLUS
1000.0000 mL | Freq: Once | INTRAVENOUS | Status: AC
  Administered 2022-10-23: 1000 mL via INTRAVENOUS

## 2022-10-23 MED ORDER — MORPHINE SULFATE (PF) 4 MG/ML IV SOLN
4.0000 mg | Freq: Once | INTRAVENOUS | Status: AC
  Administered 2022-10-23: 4 mg via INTRAVENOUS
  Filled 2022-10-23: qty 1

## 2022-10-23 MED ORDER — KETOROLAC TROMETHAMINE 30 MG/ML IJ SOLN
15.0000 mg | Freq: Once | INTRAMUSCULAR | Status: AC
  Administered 2022-10-23: 15 mg via INTRAVENOUS
  Filled 2022-10-23: qty 1

## 2022-10-23 MED ORDER — ONDANSETRON HCL 4 MG/2ML IJ SOLN
4.0000 mg | INTRAMUSCULAR | Status: AC
  Administered 2022-10-23: 4 mg via INTRAVENOUS
  Filled 2022-10-23: qty 2

## 2022-10-23 MED ORDER — ONDANSETRON 4 MG PO TBDP: ORAL_TABLET | ORAL | 0 refills | Status: DC

## 2022-10-23 NOTE — Discharge Instructions (Addendum)
You have been seen in the Emergency Department (ED) for abdominal pain.  Your evaluation suggests that your pain is caused by gallstones.  Fortunately you do not need immediate surgery at this time, but it is important that you follow up with a surgeon as an outpatient; typically surgical removal of the gallbladder is the only thing that will definitively fix your issue.  Read through the included information about a bland diet, and use any prescribed medications as instructed.  Avoid smoking and alcohol use. ? ?Please follow up as instructed above regarding today?s emergent visit and the symptoms that are bothering you. ? ?Take Norco as prescribed. Do not drink alcohol, drive or participate in any other potentially dangerous activities while taking this medication as it may make you sleepy. Do not take this medication with any other sedating medications, either prescription or over-the-counter. If you were prescribed Percocet or Vicodin, do not take these with acetaminophen (Tylenol) as it is already contained within these medications. ?  ?This medication is an opiate (or narcotic) pain medication and can be habit forming.  Use it as little as possible to achieve adequate pain control.  Do not use or use it with extreme caution if you have a history of opiate abuse or dependence.  If you are on a pain contract with your primary care doctor or a pain specialist, be sure to let them know you were prescribed this medication today from the [facility].  This medication is intended for your use only - do not give any to anyone else and keep it in a secure place where nobody else, especially children, have access to it.  It will also cause or worsen constipation, so you may want to consider taking an over-the-counter stool softener while you are taking this medication. ? ?Return to the ED if your abdominal pain worsens or fails to improve, you develop bloody vomiting, bloody diarrhea, you are  unable to tolerate fluids due to vomiting, fever greater than 101, or other symptoms that concern you. ? ?

## 2022-10-23 NOTE — ED Triage Notes (Signed)
Pt to ed from home via POV for indigestion like pain in her epigastric region. Pt is caox4, in no acute distress and ambulatory in triage. Pt advised this happened last week as well but she did not get seen for same. Pt came in tonight bc the pain will not go away.

## 2022-10-23 NOTE — ED Provider Notes (Signed)
[facility] Provider Note    Event Date/Time   First MD Initiated Contact with Patient 10/23/22 0255     (approximate)   History   Abdominal Pain   HPI Anna [name] is a 57 y.o. female who is generally healthy with no significant chronic medical issues.  She presents for evaluation of acute onset and severe pain in the upper part of her abdomen.  She said it started about 10:45 PM, 3 to 4 hours after she ate dinner.  The pain has been constant since then and is a severe dull pressure-like pain.  It is not radiating but it is making her double over with pain.  She has severe nausea but has not yet vomited.  No lower abdominal pain nor diarrhea.  No fever, chest pain, nor shortness of breath.  She says she had a similar but milder and shorter duration episode about a week ago.  No prior history.  She said that she has had acid reflux before and this does not feel like acid reflux.  She does not regularly take a PPI.     Physical Exam   Triage Vital Signs: ED Triage Vitals  Enc Vitals Group     BP 10/23/22 0206 (!) 157/92     Pulse Rate 10/23/22 0206 60     Resp 10/23/22 0206 16     Temp 10/23/22 0206 98 F (36.7 C)     Temp Source 10/23/22 0206 Oral     SpO2 10/23/22 0206 100 %     Weight 10/23/22 0202 63.5 kg (140 lb)     Height 10/23/22 0202 1.6 m (5\' 3" )     Head Circumference --      Peak Flow --      Pain Score 10/23/22 0202 10     Pain Loc --      Pain Edu? --      Excl. in GC? --     Most recent vital signs: Vitals:   10/23/22 0206  BP: (!) 157/92  Pulse: 60  Resp: 16  Temp: 98 F (36.7 C)  SpO2: 100%    General: Awake, appears very uncomfortable and appears to be in pain. CV:  Good peripheral perfusion.  Regular rate and rhythm. Resp:  Normal effort. Speaking easily and comfortably, no accessory muscle usage nor intercostal retractions.   Abd:  No distention.  Soft to palpation.  No lower abdominal tenderness but some tenderness  without guarding around the umbilicus.  Severe epigastric tenderness to palpation and equivocal Murphy sign to right upper quadrant palpation.   ED Results / Procedures / Treatments   Labs (all labs ordered are listed, but only abnormal results are displayed) Labs Reviewed  COMPREHENSIVE METABOLIC PANEL - Abnormal; Notable for the following components:      Result Value   Potassium 3.4 (*)    CO2 20 (*)    Glucose, Bld 114 (*)    BUN 23 (*)    Calcium 11.4 (*)    Total Protein 8.2 (*)    Albumin 5.1 (*)    All other components within normal limits  URINALYSIS, ROUTINE W REFLEX MICROSCOPIC - Abnormal; Notable for the following components:   Color, Urine YELLOW (*)    APPearance CLOUDY (*)    Leukocytes,Ua SMALL (*)    Bacteria, UA RARE (*)    All other components within normal limits  LIPASE, BLOOD  CBC  POC URINE PREG, ED  EKG  ED ECG REPORT I, Loleta Rose, the attending physician, personally viewed and interpreted this ECG.  Date: 10/23/2022 EKG Time: 2:07 AM Rate: 71 Rhythm: normal sinus rhythm with sinus arrhythmia QRS Axis: normal Intervals: Left anterior fascicular block ST/T Wave abnormalities: Non-specific ST segment / T-wave changes, but no clear evidence of acute ischemia. Narrative Interpretation: no definitive evidence of acute ischemia; does not meet STEMI criteria.    RADIOLOGY See hospital course for details regarding imaging results   PROCEDURES:  Critical Care performed: No  .1-3 Lead EKG Interpretation  Performed by: Loleta Rose, MD Authorized by: Loleta Rose, MD     Interpretation: normal     ECG rate:  60   ECG rate assessment: normal     Rhythm: sinus rhythm     Ectopy: none     Conduction: normal       IMPRESSION / MDM / ASSESSMENT AND PLAN / ED COURSE  I reviewed the triage vital signs and the nursing notes.                              Differential diagnosis includes, but is not limited to, acid reflux, biliary  colic/gallbladder disease, gastric/duodenal ulcer, SBO/ileus, less likely appendicitis.  Patient's presentation is most consistent with acute presentation with potential threat to life or bodily function.  Labs/studies ordered: Urinalysis, lipase, urine pregnancy, comprehensive metabolic panel, CBC, ultrasound of the right upper quadrant, EKG  Interventions/Medications given:  Medications  ketorolac (TORADOL) 30 MG/ML injection 15 mg (15 mg Intravenous Given 10/23/22 0357)  morphine (PF) 4 MG/ML injection 4 mg (4 mg Intravenous Given 10/23/22 0357)  ondansetron (ZOFRAN) injection 4 mg (4 mg Intravenous Given 10/23/22 0357)  lactated ringers bolus 1,000 mL (1,000 mLs Intravenous New Bag/Given 10/23/22 0412)    (Note:  hospital course my include additional interventions and/or labs/studies not listed above.)   Cardiac cause of symptoms seems very unlikely; pain is very specific to the epigastrium and the patient is very tender to palpation.  Strongly suspect gallbladder disease although ulcer is possible as well.  However the episodic nature would suggest biliary colic.  Lab results generally reassuring, noncontributory UA, normal lipase, slightly decreased CO2 and slightly increased BUN but otherwise essentially normal metabolic panel.  No leukocytosis on CBC.  The patient is on the cardiac monitor to evaluate for evidence of arrhythmia and/or significant heart rate changes.  Plan: Right upper quadrant ultrasound, morphine 4 mg IV, Zofran 4 mg IV, Toradol 15 mg IV.  Will reassess and obtain CT scan if right upper quadrant ultrasound is normal.  Patient agrees with the plan.  I am also ordering 1 L of LR given the slight BUN elevation and slight CO2 decline. Clinical Course as of 10/23/22 0543  Sun Oct 23, 2022  0540 Reassessed patient.  She feels much better.  Pain is almost completely gone, no ongoing nausea.  I viewed and interpreted the abdominal ultrasound and I can see gallstones but no  evidence of cholecystitis.  Radiology report agrees.  I had my usual and customary biliary colic/gallstone discussion with the patient.  She is comfortable with the plan for discharge and outpatient follow-up with surgery.  Upon reexamination she has no tenderness to palpation at this time.  She says she feels "sore" but has no rebound or guarding and no tenderness to palpation like she did earlier.  No concerning prescribing patterns on [place] controlled substance database.  Medication prescriptions as listed below. [CF]    Clinical Course User Index [CF] Loleta Rose, MD     FINAL CLINICAL IMPRESSION(S) / ED DIAGNOSES   Final diagnoses:  Calculus of gallbladder without cholecystitis without obstruction  Biliary colic     Rx / DC Orders   ED Discharge Orders          Ordered    HYDROcodone-acetaminophen (NORCO/VICODIN) 5-325 MG tablet  Every 6 hours PRN        10/23/22 0542    ondansetron (ZOFRAN-ODT) 4 MG disintegrating tablet        10/23/22 0542             Note:  This document was prepared using Dragon voice recognition software and may include unintentional dictation errors.   Loleta Rose, MD 10/23/22 [phone]

## 2022-10-25 ENCOUNTER — Ambulatory Visit: Payer: Self-pay | Admitting: Surgery

## 2022-10-25 NOTE — H&P (Signed)
Subjective:   CC: Biliary colic [K80.50]  HPI:  Anna [name] is a 57 y.o. female who was referred by [facility] for evaluation of above CC. Couple episodes now.  Symptoms were first noted several days ago. Pain is sharp, radiating from the epigastric area, to the RUQ and LUQ.  Associated with bloating, exacerbated by nothing     Past Medical History: HTN, anxiety  Past Surgical History:  has no past surgical history on file.  Family History: family history is not on file.  Social History:  has no history on file for tobacco use, alcohol use, and drug use.  Current Medications: has a current medication list which includes the following prescription(s): aspirin, cholecalciferol, citalopram, diclofenac, estradiol, ezetimibe, fenofibrate, hydrocodone-acetaminophen, ibuprofen, loratadine, losartan, omega-3 fatty acids-fish oil, ondansetron, tizanidine, trazodone, and turmeric.  Allergies:  Allergies as of 10/25/2022 - Reviewed 10/25/2022  Allergen Reaction Noted   Bupropion Itching 12/29/2011   Penicillins Other (See Comments) 12/29/2011   Buspirone Other (See Comments) 09/26/2022   Escitalopram Other (See Comments) 09/26/2022   Statins-hmg-coa reductase inhibitors Other (See Comments) 02/22/2022    ROS:  A 15 point review of systems was performed and pertinent positives and negatives noted in HPI    Objective:     BP 116/62   Pulse 78   Ht 160 cm (5\' 3" )   Wt 59.4 kg (131 lb)   BMI 23.21 kg/m    Constitutional :  No distress, cooperative, alert  Lymphatics/Throat:  Supple with no lymphadenopathy  Respiratory:  Clear to auscultation bilaterally  Cardiovascular:  Regular rate and rhythm  Gastrointestinal: Soft, non-tender, non-distended, no organomegaly.  Musculoskeletal: Steady gait and movement  Skin: Cool and moist  Psychiatric: Normal affect, non-agitated, not confused       LABS:  N/a   RADS: CLINICAL DATA:  Epigastric pain and nausea.   EXAM:   ULTRASOUND ABDOMEN LIMITED RIGHT UPPER QUADRANT   COMPARISON:  Ultrasound complete abdomen 09/20/2007, CT with IV  contrast 02/21/2007.   FINDINGS:  Gallbladder:   There are multiple layering shadowing stones partially filling the  lumen. The largest is 5 mm. The prior study did not show evidence of  stones. There is no free wall thickening or pericholecystic fluid.  Sonographic Murphy's sign could not be evaluated as the patient was  pain medicated.   Common bile duct:   Diameter: 3.7 mm with no intrahepatic biliary prominence.   Liver:   No focal lesion identified. Within normal limits in parenchymal  echogenicity. Portal vein is patent on color Doppler imaging with  normal direction of blood flow towards the liver.   Other: None.   IMPRESSION:  Cholelithiasis without evidence of cholecystitis. Sonographic  Murphy's sign could not be evaluated as the patient was  pain-medicated, but there was no free wall thickening or  pericholecystic fluid.    Electronically Signed    By: Almira Bar M.D.    On: 10/23/2022 05:09  Assessment:      Biliary colic [K80.50]  Plan:     1. Biliary colic [K80.50] Discussed the risk of surgery including post-op infxn, seroma, biloma, chronic pain, poor-delayed wound healing, retained gallstone, conversion to open procedure, post-op SBO or ileus, and need for additional procedures to address said risks.  The risks of general anesthetic including MI, CVA, sudden death or even reaction to anesthetic medications also discussed. Alternatives include continued observation.  Benefits include possible symptom relief, prevention of complications including acute cholecystitis, pancreatitis.  Typical post operative  recovery of 3-5 days rest, continued pain in area and incision sites, possible loose stools up to 4-6 weeks, also discussed.  ED return precautions given for sudden increase in RUQ pain, with possible accompanying fever, nausea,  and/or vomiting.  The patient understands the risks, any and all questions were answered to the patient's satisfaction.  2. Patient has elected to proceed with surgical treatment. Procedure will be scheduled.  Written consent was obtained.robotic assisted laparoscopic  labs/images/medications/previous chart entries reviewed personally and relevant changes/updates noted above.

## 2022-10-27 ENCOUNTER — Encounter: Payer: Self-pay | Admitting: Family

## 2022-11-17 ENCOUNTER — Encounter
Admission: RE | Admit: 2022-11-17 | Discharge: 2022-11-17 | Disposition: A | Discharge status: Home / Self Care | Payer: PRIVATE HEALTH INSURANCE | Source: Ambulatory Visit | Attending: Surgery | Admitting: Surgery

## 2022-11-17 HISTORY — DX: Epilepsy, unspecified, not intractable, without status epilepticus: G40.909

## 2022-11-17 HISTORY — DX: Calculus of bile duct without cholangitis or cholecystitis without obstruction: K80.50

## 2022-11-17 HISTORY — DX: Supraventricular tachycardia, unspecified: I47.10

## 2022-11-17 HISTORY — DX: Palpitations: R00.2

## 2022-11-17 HISTORY — DX: Family history of other specified conditions: Z84.89

## 2022-11-17 HISTORY — DX: Cardiac murmur, unspecified: R01.1

## 2022-11-17 HISTORY — DX: Personal history of nicotine dependence: Z87.891

## 2022-11-17 NOTE — Patient Instructions (Signed)
Your procedure is scheduled on:11-25-22 Friday Report to the Registration Desk on the 1st floor of the [facility].Then proceed to the 2nd floor Surgery Desk To find out your arrival time, please call [phone] between 1PM - 3PM on:11-24-22 Thursday If your arrival time is 6:00 am, do not arrive before that time as the [facility] entrance doors do not open until 6:00 am.  REMEMBER: Instructions that are not followed completely may result in serious medical risk, up to and including death; or upon the discretion of your surgeon and anesthesiologist your surgery may need to be rescheduled.  Do not eat food after midnight the night before surgery.  No gum chewing or hard candies.  You may however, drink CLEAR liquids up to 2 hours before you are scheduled to arrive for your surgery. Do not drink anything within 2 hours of your scheduled arrival time.  Clear liquids include: - water  - apple juice without pulp - gatorade (not RED colors) - black coffee or tea (Do NOT add milk or creamers to the coffee or tea) Do NOT drink anything that is not on this list.  One week prior to surgery:Last dose today 11-17-22 Stop Anti-inflammatories (NSAIDS) such as Advil, Aleve, Ibuprofen, Motrin, Naproxen, Naprosyn and Aspirin based products such as Excedrin, Goody's Powder, BC Powder.You may however, continue to take Tylenol if needed for pain up until the day of surgery. Stop ANY OVER THE COUNTER supplements/vitamins NOW (11-17-22) until after surgery (Vitamin D3, Ocuvite Preservision, Fish Oil and Turmeric)   Continue taking all prescribed medications   TAKE ONLY THESE MEDICATIONS THE MORNING OF SURGERY WITH A SIP OF WATER: -citalopram (CELEXA)   Call Dr Geoffery Lyons office today (11-17-22) regarding your 81 mg Aspirin if he did not specify on instructions from his office when you need to stop it  No Alcohol for 24 hours before or after surgery.  No Smoking including e-cigarettes for 24 hours before  surgery.  No chewable tobacco products for at least 6 hours before surgery.  No nicotine patches on the day of surgery.  Do not use any "recreational" drugs for at least a week (preferably 2 weeks) before your surgery.  Please be advised that the combination of cocaine and anesthesia may have negative outcomes, up to and including death. If you test positive for cocaine, your surgery will be cancelled.  On the morning of surgery brush your teeth with toothpaste and water, you may rinse your mouth with mouthwash if you wish. Do not swallow any toothpaste or mouthwash.  Use CHG Soap as directed on instruction sheet.  Do not wear jewelry, make-up, hairpins, clips or nail polish.  Do not wear lotions, powders, or perfumes.   Do not shave body hair from the neck down 48 hours before surgery.  Contact lenses, hearing aids and dentures may not be worn into surgery.  Do not bring valuables to the hospital. [facility] is not responsible for any missing/lost belongings or valuables.   Notify your doctor if there is any change in your medical condition (cold, fever, infection).  Wear comfortable clothing (specific to your surgery type) to the hospital.  After surgery, you can help prevent lung complications by doing breathing exercises.  Take deep breaths and cough every 1-2 hours. Your doctor may order a device called an Incentive Spirometer to help you take deep breaths. When coughing or sneezing, hold a pillow firmly against your incision with both hands. This is called "splinting." Doing this helps protect your  incision. It also decreases belly discomfort.  If you are being admitted to the hospital overnight, leave your suitcase in the car. After surgery it may be brought to your room.  In case of increased patient census, it may be necessary for you, the patient, to continue your postoperative care in the Same Day Surgery department.  If you are being discharged the day of surgery, you  will not be allowed to drive home. You will need a responsible individual to drive you home and stay with you for 24 hours after surgery.   If you are taking public transportation, you will need to have a responsible individual with you.  Please call the Pre-admissions Testing Dept. at [phone] if you have any questions about these instructions.  Surgery Visitation Policy:  Patients having surgery or a procedure may have two visitors.  Children under the age of 79 must have an adult with them who is not the patient.     Preparing for Surgery with CHLORHEXIDINE GLUCONATE (CHG) Soap  Chlorhexidine Gluconate (CHG) Soap  o An antiseptic cleaner that kills germs and bonds with the skin to continue killing germs even after washing  o Used for showering the night before surgery and morning of surgery  Before surgery, you can play an important role by reducing the number of germs on your skin.  CHG (Chlorhexidine gluconate) soap is an antiseptic cleanser which kills germs and bonds with the skin to continue killing germs even after washing.  Please do not use if you have an allergy to CHG or antibacterial soaps. If your skin becomes reddened/irritated stop using the CHG.  1. Shower the NIGHT BEFORE SURGERY and the MORNING OF SURGERY with CHG soap.  2. If you choose to wash your hair, wash your hair first as usual with your normal shampoo.  3. After shampooing, rinse your hair and body thoroughly to remove the shampoo.  4. Use CHG as you would any other liquid soap. You can apply CHG directly to the skin and wash gently with a scrungie or a clean washcloth.  5. Apply the CHG soap to your body only from the neck down. Do not use on open wounds or open sores. Avoid contact with your eyes, ears, mouth, and genitals (private parts). Wash face and genitals (private parts) with your normal soap.  6. Wash thoroughly, paying special attention to the area where your surgery will be  performed.  7. Thoroughly rinse your body with warm water.  8. Do not shower/wash with your normal soap after using and rinsing off the CHG soap.  9. Pat yourself dry with a clean towel.  10. Wear clean pajamas to bed the night before surgery.  12. Place clean sheets on your bed the night of your first shower and do not sleep with pets.  13. Shower again with the CHG soap on the day of surgery prior to arriving at the hospital.  14. Do not apply any deodorants/lotions/powders.  15. Please wear clean clothes to the hospital.

## 2022-11-23 ENCOUNTER — Encounter: Payer: Self-pay | Admitting: Surgery

## 2022-11-23 ENCOUNTER — Encounter: Payer: Self-pay | Admitting: Urgent Care

## 2022-11-24 ENCOUNTER — Encounter: Payer: Self-pay | Admitting: Surgery

## 2022-11-24 NOTE — Progress Notes (Signed)
Perioperative / Anesthesia Services  Pre-Admission Testing Clinical Review / Preoperative Anesthesia Consult  Date: 11/24/22  Patient Demographics:  Name: Anna [name] DOB:   [birth year] MRN:   [id number]  Planned Surgical Procedure(s):    Case: [id number] Date/Time: 11/25/22 0715   Procedures:      XI ROBOTIC ASSISTED LAPAROSCOPIC CHOLECYSTECTOMY (Abdomen)     INDOCYANINE GREEN FLUORESCENCE IMAGING (ICG)   Anesthesia type: General   Pre-op diagnosis: biliary colic K80.50   Location: [facility] OR ROOM 04 / [facility]   Surgeons: Sung Amabile, DO     NOTE: Available PAT nursing documentation and vital signs have been reviewed. Clinical nursing staff has updated patient's PMH/PSHx, current medication list, and drug allergies/intolerances to ensure comprehensive history available to assist in medical decision making as it pertains to the aforementioned surgical procedure and anticipated anesthetic course. Extensive review of available clinical information personally performed. Eleva PMH and PSHx updated with any diagnoses/procedures that  may have been inadvertently omitted during her intake with the pre-admission testing department's nursing staff.  Clinical Discussion:  Anna [name] is a 57 y.o. female who is submitted for pre-surgical anesthesia review and clearance prior to her undergoing the above procedure. Patient is a Current Smoker (18.6 pack years). Pertinent PMH includes: CAD, diastolic dysfunction, PSVT, cardiac murmur, palpitations, HTN, HLD, anemia, OA, seizure disorder, anxiety, depression, insomnia.  Patient is followed by cardiology Azucena Cecil, MD). She was last seen in the cardiology clinic on 08/18/2022; notes reviewed. At the time of her clinic visit, patient doing well overall from a cardiovascular perspective. Patient denied any chest pain, shortness of breath, PND, orthopnea, palpitations, significant peripheral edema, weakness, fatigue, vertiginous  symptoms, or presyncope/syncope. Patient with a past medical history significant for cardiovascular diagnoses. Documented physical exam was grossly benign, providing no evidence of acute exacerbation and/or decompensation of the patient's known cardiovascular conditions.  Coronary CTA was performed on 04/04/2022 revealing an elevated coronary calcium score of 1487.  This placed patient in the 99th percentile for age/sex/race matched control.  Patient with normal coronary origin and RIGHT sided dominance.  Moderate atherosclerotic calcifications noted in the proximal LAD causing a 50 to 69% stenosis.  Additionally, there was mild calcification in the mid RCA and LCx causing 25-49% stenosis.  Study submitted for CT FFR analysis to further determine presence of any significant degree of stenosis (ranges: < 0.75 high likelihood of hemodynamically significant stenosis, 0.76-0.80 borderline, > 0.80 normal):  LM = no significant stenosis LAD = 0.86 LCx = 0.85 RCA = 0.95  Long-term cardiac event monitor study performed on 04/12/2022 revealing a predominant underlying sinus rhythm at an average rate of 81 bpm; range 53-1 53.  There were no significant arrhythmias or prolonged pauses.  Patient triggered events associated with sinus rhythm and sinus tachycardia.  TTE performed on 05/17/2022 revealed a normal left ventricular systolic function with an EF of 60 to 65%.  There were no regional wall motion abnormalities. Left ventricular diastolic Doppler parameters consistent with abnormal relaxation (G1DD).  GLS -20.9%.  Right ventricular size and function normal.  PASP mildly elevated at 42.2 mmHg.  There was trivial mitral valve regurgitation.  All transvalvular gradients were noted to be normal providing no evidence suggestive of valvular stenosis.  Aorta normal in size with no evidence of aneurysmal dilatation.  Blood pressure well controlled at 100/88 mmHg on currently prescribed ARB (losartan) monotherapy.   Patient is on ezetimibe + fenofibrate + omega-3 fatty acid for her HLD diagnosis  and ASCVD prevention. Patient is not diabetic. She does not have an OSAH diagnosis.  Patient maintains a very active lifestyle.  Patient reports walking up to 7 to 8 miles a day completing her duties at work.  She regularly monitors her blood pressures at home and reports SBP's in the range of 110-120s mmHg. Patient is able to complete all of her  ADL/IADLs without cardiovascular limitation.  Per the DASI, patient is able to achieve at least 4 METS of physical activity without experiencing any significant degree of angina/anginal equivalent symptoms No changes were made to her medication regimen during her visit with cardiology.  Patient scheduled to follow-up with outpatient cardiology in 12 months or sooner if needed.  Anna [name] is scheduled for an XI ROBOTIC ASSISTED LAPAROSCOPIC CHOLECYSTECTOMY on 11/25/2022 with Dr. Sung Amabile, DO.  Given patient's past medical history significant for cardiovascular diagnoses, presurgical cardiac clearance was sought by the PAT team. Per cardiology, "based ACC/AHA guidelines, the patient's past medical history, and the amount of time since her last clinic visit, this patient would be at an overall ACCEPTABLE risk for the planned procedure without further cardiovascular testing or intervention at this time".   In review of her medication reconciliation, it is noted that patient is currently on prescribed daily antithrombotic therapy. She has been instructed on recommendations for holding her daily low-dose ASA for 5 days prior to her procedure with plans to restart as soon as postoperative bleeding risk felt to be minimized by her attending surgeon. The patient has been instructed that her last dose of her ASA should be on 11/19/2022.  Patient denies previous perioperative complications with anesthesia in the past.  Patient does report (+) family history of delayed emergence in  second-degree relative (maternal niece). In review of the available records, it is noted that patient underwent a MAC anesthetic course at [facility] ([facility]) in 08/2019 without documented complications.      11/17/2022    9:00 AM 10/23/2022    2:06 AM 10/23/2022    2:02 AM  Vitals with BMI  Height 5\' 3"   5\' 3"   Weight 130 lbs 15 oz  140 lbs  BMI 23.2  24.81  Systolic  157   Diastolic  92   Pulse  60     Providers/Specialists:   NOTE: Primary physician provider listed below. Patient may have been seen by APP or partner within same practice.   PROVIDER ROLE / SPECIALTY LAST Michelle Nasuti, DO General Surgery (Surgeon) 10/25/2022  Mort Sawyers, FNP Primary Care Provider 09/26/2022  Debbe Odea, MD Cardiology 08/18/2022   Allergies:  Buspirone, Lexapro [escitalopram], Penicillins, Statins, and Wellbutrin [bupropion]  Current Home Medications:   No current facility-administered medications for this encounter.    aspirin EC 81 MG tablet   cholecalciferol (VITAMIN D3) 25 MCG (1000 UNIT) tablet   citalopram (CELEXA) 10 MG tablet   diclofenac Sodium (VOLTAREN) 1 % GEL   ezetimibe (ZETIA) 10 MG tablet   fenofibrate 160 MG tablet   loratadine (CLARITIN) 10 MG tablet   losartan (COZAAR) 25 MG tablet   Multiple Vitamins-Minerals (OCUVITE PRESERVISION PO)   Omega-3 Fatty Acids (FISH OIL PO)   tiZANidine (ZANAFLEX) 4 MG tablet   traZODone (DESYREL) 100 MG tablet   Turmeric (QC TUMERIC COMPLEX PO)   History:   Past Medical History:  Diagnosis Date   Allergy    Anemia    Anxiety    Arthritis of low back  Bilateral low back pain with right-sided sciatica    Biliary colic    Coronary artery disease 04/04/2022   a.) cCTA 04/04/2022: Ca2+ 1487 (99th percentile age/sex/race matched control) --> ctFFR suggested no significant stenosis.   Depression    Diastolic dysfunction 05/17/2022   a.) TTE 05/17/2022: EF 60-65%, no RWMAs,  GLS -20.9%, PASP 42.2, triv MR, G1DD   Family history of adverse reaction to anesthesia    a.) delayed emergence in second degree relative (maternal niece)   Heart murmur    History of tobacco use    Hyperlipidemia    Insomnia    a.) takes trazodone PRN   Palpitations    Primary hypertension 09/26/2022   PSVT (paroxysmal supraventricular tachycardia)    Seizure disorder (HCC)    very rare and when it happens pt states she loses her vision short term-last episode around 2022   Statin intolerance    Tobacco abuse 07/18/2016   Past Surgical History:  Procedure Laterality Date   CATARACT EXTRACTION, BILATERAL     COLONOSCOPY  01/10/2012   Procedure: COLONOSCOPY;  Surgeon: Dalia Heading, MD;  Location: [facility];  Service: Gastroenterology;  Laterality: N/A;   CYST EXCISION Right    hand   DILATION AND CURETTAGE OF UTERUS     Ablation   FOOT SURGERY Left    LUMBAR FUSION     TONSILLECTOMY     Family History  Problem Relation Age of Onset   Hypertension Mother    Diabetes Mother    Heart attack Mother 46   Heart disease Mother    Kidney disease Mother    Hyperlipidemia Mother    Non-Hodgkin's lymphoma Father        t cell   Arthritis Father    Hyperlipidemia Father    Thyroid disease Daughter    Breast cancer Cousin        in 45's   Breast cancer Cousin        in 46's   Social History   Tobacco Use   Smoking status: Every Day    Current packs/day: 0.00    Average packs/day: 0.5 packs/day for 37.3 years (18.6 ttl pk-yrs)    Types: Cigarettes    Start date: 05/09/1984    Last attempt to quit: 08/24/2021    Years since quitting: 1.2   Smokeless tobacco: Never  Vaping Use   Vaping status: Never Used  Substance Use Topics   Alcohol use: Not Currently   Drug use: No    Pertinent Clinical Results:  LABS:   No visits with results within 3 Day(s) from this visit.  Latest known visit with results is:  Admission on 10/23/2022, Discharged on 10/23/2022   Component Date Value Ref Range Status   Lipase 10/23/2022 35  11 - 51 U/L Final   Performed at [facility], [address] [place], [place] [address]   Sodium 10/23/2022 139  135 - 145 mmol/L Final   Potassium 10/23/2022 3.4 (L)  3.5 - 5.1 mmol/L Final   Chloride 10/23/2022 105  98 - 111 mmol/L Final   CO2 10/23/2022 20 (L)  22 - 32 mmol/L Final   Glucose, Bld 10/23/2022 114 (H)  70 - 99 mg/dL Final   Glucose reference range applies only to samples taken after fasting for at least 8 hours.   BUN 10/23/2022 23 (H)  6 - 20 mg/dL Final   Creatinine, Ser 10/23/2022 0.96  0.44 - 1.00 mg/dL Final   Calcium  10/23/2022 11.4 (H)  8.9 - 10.3 mg/dL Final   Total Protein 16/02/9603 8.2 (H)  6.5 - 8.1 g/dL Final   Albumin 54/01/8118 5.1 (H)  3.5 - 5.0 g/dL Final   AST 14/78/2956 30  15 - 41 U/L Final   ALT 10/23/2022 18  0 - 44 U/L Final   Alkaline Phosphatase 10/23/2022 55  38 - 126 U/L Final   Total Bilirubin 10/23/2022 0.8  0.3 - 1.2 mg/dL Final   GFR, Estimated 10/23/2022 >60  >60 mL/min Final   Comment: (NOTE) Calculated using the CKD-EPI Creatinine Equation (2021)    Anion gap 10/23/2022 14  5 - 15 Final   Performed at [facility], [address] [place], [place] [address]   WBC 10/23/2022 7.6  4.0 - 10.5 K/uL Final   RBC 10/23/2022 4.16  3.87 - 5.11 MIL/uL Final   Hemoglobin 10/23/2022 12.5  12.0 - 15.0 g/dL Final   HCT 65/78/4696 37.8  36.0 - 46.0 % Final   MCV 10/23/2022 90.9  80.0 - 100.0 fL Final   MCH 10/23/2022 30.0  26.0 - 34.0 pg Final   MCHC 10/23/2022 33.1  30.0 - 36.0 g/dL Final   RDW 29/52/8413 12.0  11.5 - 15.5 % Final   Platelets 10/23/2022 317  150 - 400 K/uL Final   nRBC 10/23/2022 0.0  0.0 - 0.2 % Final   Performed at [facility], [address] [place], [place] [address]   Color, Urine 10/23/2022 YELLOW (A)  YELLOW Final   APPearance 10/23/2022 CLOUDY (A)  CLEAR Final   Specific Gravity, Urine 10/23/2022 1.012  1.005 - 1.030 Final    pH 10/23/2022 7.0  5.0 - 8.0 Final   Glucose, UA 10/23/2022 NEGATIVE  NEGATIVE mg/dL Final   Hgb urine dipstick 10/23/2022 NEGATIVE  NEGATIVE Final   Bilirubin Urine 10/23/2022 NEGATIVE  NEGATIVE Final   Ketones, ur 10/23/2022 NEGATIVE  NEGATIVE mg/dL Final   Protein, ur 02/72/5366 NEGATIVE  NEGATIVE mg/dL Final   Nitrite 44/07/4740 NEGATIVE  NEGATIVE Final   Leukocytes,Ua 10/23/2022 SMALL (A)  NEGATIVE Final   RBC / HPF 10/23/2022 0-5  0 - 5 RBC/hpf Final   WBC, UA 10/23/2022 NONE SEEN  0 - 5 WBC/hpf Final   Bacteria, UA 10/23/2022 RARE (A)  NONE SEEN Final   Squamous Epithelial / HPF 10/23/2022 0-5  0 - 5 /HPF Final   Performed at [facility], [address] [place], [place] [address]   Preg Test, Ur 10/23/2022 NEGATIVE  NEGATIVE Final   Comment:        THE SENSITIVITY OF THIS METHODOLOGY IS >24 mIU/mL     ECG: Date: 10/23/2022 Time ECG obtained: 0207 AM Rate: 71 bpm Rhythm:  Normal sinus rhythm with sinus arrhythmia Axis (leads I and aVF): Normal Intervals: PR 164 ms. QRS 90 ms. QTc 423 ms. ST segment and T wave changes: No evidence of acute ST segment elevation or depression Comparison: Similar to previous tracing obtained on 05/19/2022   IMAGING / PROCEDURES: US ABDOMEN LIMITED RUQ (LIVER/GB) performed on 10/23/2022 Cholelithiasis without evidence of cholecystitis.  Sonographic Murphy's sign could not be evaluated as the patient was pain-medicated, but there was no free wall thickening or pericholecystic fluid.  TRANSTHORACIC ECHOCARDIOGRAM performed on 05/17/2022 Left ventricular ejection fraction, by estimation, is 60 to 65%. The left ventricle has normal function. The left ventricle has no regional wall motion abnormalities. Left ventricular diastolic parameters are consistent with Grade I diastolic dysfunction (impaired relaxation). The  average left ventricular global longitudinal strain is -20.9 %.  Right ventricular systolic function is normal. The  right ventricular size is normal. There is mildly elevated pulmonary artery systolic  pressure. The estimated right ventricular systolic pressure is 42.2 mmHg.  The mitral valve is normal in structure. Trivial mitral valve regurgitation. No evidence of mitral stenosis.  The aortic valve is normal in structure. Aortic valve regurgitation is not visualized. No aortic stenosis is present.  The inferior vena cava is normal in size with greater than 50% respiratory variability, suggesting right atrial pressure of 3 mmHg.   LONG TERM CARDIAC EVENT MONITOR STUDY performed on  04/12/2022 Patch Wear Time:  12 days and 2 hours (2023-11-14T17:28:59-0500 to 2023-11-26T20:23:34-0500) Patient had a min HR of 53 bpm, max HR of 153 bpm, and avg HR of 81 bpm.  Predominant underlying rhythm was Sinus Rhythm.  Isolated SVEs were rare (<1.0%), SVE Couplets were rare (<1.0%), and SVE Triplets were rare (<1.0%).  Isolated VEs were rare (<1.0%), VE Couplets were rare (<1.0%), and no VE Triplets were present.  Benign cardiac monitor with no significant arrhythmias. Patient triggered events associated with sinus rhythm with sinus tachycardia.   CT CORONARY MORPH W/CTA COR W/SCORE W/CA W/CM &/OR WO/CM W/ FRACTIONAL FLOW RESERVE performed on 04/04/2022 Coronary calcium score of 1487. This was 99th percentile for age and sex matched control. Normal coronary origin with right dominance. Calcified plaque in the proximal LAD causing moderate stenosis (50-69%). Mild RCA and LCx stenosis (25-49%). CAD-RADS 3. Moderate stenosis. Consider symptom-guided anti-ischemic pharmacotherapy as well as risk factor modification per guideline directed care. CT FFR analysis (ranges: < 0.75 high likelihood of hemodynamically significant stenosis, 0.76-0.80 borderline, > 0.80 normal):  Left Main:  No significant stenosis. LAD: No significant stenosis. FFRct 0.8 LCX: No significant stenosis. FFRct 0.85 RCA: No significant stenosis. FFRct  0.95   Impression and Plan:  Anna [name] has been referred for pre-anesthesia review and clearance prior to her undergoing the planned anesthetic and procedural courses. Available labs, pertinent testing, and imaging results were personally reviewed by me in preparation for upcoming operative/procedural course. [facility] medical record has been updated following extensive record review and patient interview with PAT staff.   This patient has been appropriately cleared by cardiology with an overall ACCEPTABLE  risk of experiencing significant perioperative cardiovascular complications. Based on clinical review performed today (11/24/22), barring any significant acute changes in the patient's overall condition, it is anticipated that she will be able to proceed with the planned surgical intervention. Any acute changes in clinical condition may necessitate her procedure being postponed and/or cancelled. Patient will meet with anesthesia team (MD and/or CRNA) on the day of her procedure for preoperative evaluation/assessment. Questions regarding anesthetic course will be fielded at that time.   Pre-surgical instructions were reviewed with the patient during her PAT appointment, and questions were fielded to satisfaction by PAT clinical staff. She has been instructed on which medications that she will need to hold prior to surgery, as well as the ones that have been deemed safe/appropriate to take on the day of her procedure. As part of the general education provided by PAT, patient made aware both verbally and in writing, that she would need to abstain from the use of any illegal substances during her perioperative course.  She was advised that failure to follow the provided instructions could necessitate case cancellation or result in serious perioperative complications up to and including death. Patient encouraged to contact PAT and/or her  surgeon's office to discuss any questions or concerns that may arise  prior to surgery; verbalized understanding.   Quentin Mulling, MSN, APRN, FNP-C, CEN [facility]  Peri-operative Services Nurse Practitioner Phone: [phone] Fax: [phone] 11/24/22 1:32 PM  NOTE: This note has been prepared using Dragon dictation software. Despite my best ability to proofread, there is always the potential that unintentional transcriptional errors may still occur from this process.

## 2022-11-25 ENCOUNTER — Encounter: Admission: RE | Payer: Self-pay | Source: Home / Self Care

## 2022-11-25 ENCOUNTER — Ambulatory Visit: Admission: RE | Admit: 2022-11-25 | Payer: PRIVATE HEALTH INSURANCE | Source: Home / Self Care | Admitting: Surgery

## 2022-11-25 HISTORY — DX: Insomnia, unspecified: G47.00

## 2022-11-25 HISTORY — DX: Other specified health status: Z78.9

## 2022-11-25 HISTORY — DX: Spondylosis without myelopathy or radiculopathy, site unspecified: M47.819

## 2022-11-25 SURGERY — CHOLECYSTECTOMY, ROBOT-ASSISTED, LAPAROSCOPIC
Anesthesia: General | Site: Abdomen

## 2022-12-04 ENCOUNTER — Emergency Department
Admission: EM | Admit: 2022-12-04 | Discharge: 2022-12-04 | Disposition: A | Discharge status: Home / Self Care | Payer: PRIVATE HEALTH INSURANCE | Attending: Emergency Medicine | Admitting: Emergency Medicine

## 2022-12-04 ENCOUNTER — Emergency Department: Payer: PRIVATE HEALTH INSURANCE

## 2022-12-04 ENCOUNTER — Other Ambulatory Visit: Payer: Self-pay

## 2022-12-04 DIAGNOSIS — R1010 Upper abdominal pain, unspecified: Secondary | ICD-10-CM | POA: Diagnosis present

## 2022-12-04 DIAGNOSIS — E876 Hypokalemia: Secondary | ICD-10-CM | POA: Diagnosis not present

## 2022-12-04 DIAGNOSIS — K8050 Calculus of bile duct without cholangitis or cholecystitis without obstruction: Secondary | ICD-10-CM

## 2022-12-04 DIAGNOSIS — K8020 Calculus of gallbladder without cholecystitis without obstruction: Secondary | ICD-10-CM | POA: Insufficient documentation

## 2022-12-04 LAB — BASIC METABOLIC PANEL
Anion gap: 7 (ref 5–15)
BUN: 20 mg/dL (ref 6–20)
CO2: 24 mmol/L (ref 22–32)
Calcium: 10.1 mg/dL (ref 8.9–10.3)
Chloride: 109 mmol/L (ref 98–111)
Creatinine, Ser: 0.71 mg/dL (ref 0.44–1.00)
GFR, Estimated: >60 mL/min (ref >60)
Glucose, Bld: 98 mg/dL (ref 70–99)
Potassium: 3.1 mmol/L — ABNORMAL LOW (ref 3.5–5.1)
Sodium: 140 mmol/L (ref 135–145)

## 2022-12-04 LAB — CBC
HCT: 36.2 % (ref 36.0–46.0)
Hemoglobin: 12.1 g/dL (ref 12.0–15.0)
MCH: 29.9 pg (ref 26.0–34.0)
MCHC: 33.4 g/dL (ref 30.0–36.0)
MCV: 89.4 fL (ref 80.0–100.0)
Platelets: 449 10*3/uL — ABNORMAL HIGH (ref 150–400)
RBC: 4.05 MIL/uL (ref 3.87–5.11)
RDW: 11.9 % (ref 11.5–15.5)
WBC: 6.7 10*3/uL (ref 4.0–10.5)
nRBC: 0 % (ref 0.0–0.2)

## 2022-12-04 LAB — LIPASE, BLOOD: Lipase: 42 U/L (ref 11–51)

## 2022-12-04 LAB — HEPATIC FUNCTION PANEL
ALT: 18 U/L (ref 0–44)
AST: 22 U/L (ref 15–41)
Albumin: 4.8 g/dL (ref 3.5–5.0)
Alkaline Phosphatase: 56 U/L (ref 38–126)
Bilirubin, Direct: 0.1 mg/dL (ref 0.0–0.2)
Indirect Bilirubin: 0.4 mg/dL (ref 0.3–0.9)
Total Bilirubin: 0.5 mg/dL (ref 0.3–1.2)
Total Protein: 7.7 g/dL (ref 6.5–8.1)

## 2022-12-04 LAB — TROPONIN I (HIGH SENSITIVITY): Troponin I (High Sensitivity): 2 ng/L (ref <18)

## 2022-12-04 MED ORDER — ONDANSETRON 4 MG PO TBDP: ORAL_TABLET | ORAL | 0 refills | Status: DC

## 2022-12-04 MED ORDER — MORPHINE SULFATE (PF) 4 MG/ML IV SOLN
4.0000 mg | Freq: Once | INTRAVENOUS | Status: AC
  Administered 2022-12-04: 4 mg via INTRAVENOUS
  Filled 2022-12-04: qty 1

## 2022-12-04 MED ORDER — HYDROCODONE-ACETAMINOPHEN 5-325 MG PO TABS: 2.0000 | ORAL_TABLET | Freq: Four times a day (QID) | ORAL | 0 refills | Status: DC | PRN

## 2022-12-04 MED ORDER — ONDANSETRON HCL 4 MG/2ML IJ SOLN
4.0000 mg | INTRAMUSCULAR | Status: AC
  Administered 2022-12-04: 4 mg via INTRAVENOUS
  Filled 2022-12-04: qty 2

## 2022-12-04 MED ORDER — KETOROLAC TROMETHAMINE 30 MG/ML IJ SOLN
15.0000 mg | Freq: Once | INTRAMUSCULAR | Status: AC
  Administered 2022-12-04: 15 mg via INTRAVENOUS
  Filled 2022-12-04: qty 1

## 2022-12-04 NOTE — ED Provider Notes (Signed)
[facility] Provider Note    Event Date/Time   First MD Initiated Contact with Patient 12/04/22 [phone]     (approximate)   History   Abdominal Pain   HPI Anna [name] is a 57 y.o. female whose medical history includes known gallbladder disease and presents for evaluation of acute onset and severe pain in and the upper abdomen that radiates to the back associate with nausea and vomiting that feels similar to prior episodes of biliary colic.  She was admitted by me to the surgical service about a month and a half ago to Dr. Tonna Boehringer for symptomatic cholelithiasis to have a cholecystectomy.  However, she and her husband report that her insurance refused to pay for the procedure so she was discharged and given several other options by the insurance company and told to follow-up with other surgeons.  She has done so but for various reasons has not yet been able to have the procedure.  She said it has been about a month since her last attack of pain but this 1 is the most severe.  Nothing in particular makes the pain better or worse.  She denies other symptoms such as fever, chest pain, and shortness of breath.  No lower abdominal pain or dysuria.     Physical Exam   Triage Vital Signs: ED Triage Vitals  Encounter Vitals Group     BP 12/04/22 0431 (!) 142/80     Systolic BP Percentile --      Diastolic BP Percentile --      Pulse Rate 12/04/22 0431 68     Resp 12/04/22 0431 20     Temp 12/04/22 0431 98.3 F (36.8 C)     Temp Source 12/04/22 0431 Oral     SpO2 12/04/22 0431 100 %     Weight 12/04/22 0432 56.2 kg (124 lb)     Height 12/04/22 0432 1.6 m (5\' 3" )     Head Circumference --      Peak Flow --      Pain Score 12/04/22 0432 8     Pain Loc --      Pain Education --      Exclude from Growth Chart --     Most recent vital signs: Vitals:   12/04/22 0630 12/04/22 0700  BP: 124/79 120/73  Pulse: 70 66  Resp: 13 16  Temp:    SpO2: 94% 99%     General: Awake, appears uncomfortable but is not in severe distress. CV:  Good peripheral perfusion.  Regular rate and rhythm. Resp:  Normal effort. Speaking easily and comfortably, no accessory muscle usage nor intercostal retractions.   Abd:  No distention.  Tenderness to palpation with guarding to the epigastrium and right upper quadrant with positive Murphy sign.  No lower abdominal tenderness.  No guarding or rebound in the lower abdomen.   ED Results / Procedures / Treatments   Labs (all labs ordered are listed, but only abnormal results are displayed) Labs Reviewed  BASIC METABOLIC PANEL - Abnormal; Notable for the following components:      Result Value   Potassium 3.1 (*)    All other components within normal limits  CBC - Abnormal; Notable for the following components:   Platelets 449 (*)    All other components within normal limits  LIPASE, BLOOD  HEPATIC FUNCTION PANEL  TROPONIN I (HIGH SENSITIVITY)     EKG  ED ECG REPORT I, Loleta Rose, the attending physician,  personally viewed and interpreted this ECG.  Date: 12/04/2022 EKG Time: 4:35 Rate: 68 Rhythm: normal sinus rhythm QRS Axis: Left axis deviation Intervals: Incomplete right bundle branch block ST/T Wave abnormalities: Non-specific ST segment / T-wave changes, but no clear evidence of acute ischemia. Narrative Interpretation: no definitive evidence of acute ischemia; does not meet STEMI criteria.    RADIOLOGY I viewed and interpreted the patient's two-view chest x-ray there is no evidence of pneumonia, pneumothorax, nor other acute abnormality.  I also viewed and interpreted the right upper quadrant ultrasound.  See hospital course for details, but in short, no evidence of cholecystitis.   PROCEDURES:  Critical Care performed: No  .1-3 Lead EKG Interpretation  Performed by: Loleta Rose, MD Authorized by: Loleta Rose, MD     Interpretation: normal     ECG rate:  65   ECG rate  assessment: normal     Rhythm: sinus rhythm     Ectopy: none     Conduction: normal       IMPRESSION / MDM / ASSESSMENT AND PLAN / ED COURSE  I reviewed the triage vital signs and the nursing notes.                              Differential diagnosis includes, but is not limited to, biliary colic/symptomatic cholelithiasis, cholecystitis, choledocholithiasis, pancreatitis, gastritis, atypical ACS presentation.  Patient's presentation is most consistent with acute presentation with potential threat to life or bodily function.  Labs/studies ordered: Chest x-ray, right upper quadrant ultrasound, hepatic function panel, lipase, basic metabolic panel, high-sensitivity troponin, CBC  Interventions/Medications given:  Medications  ketorolac (TORADOL) 30 MG/ML injection 15 mg (15 mg Intravenous Given 12/04/22 0529)  morphine (PF) 4 MG/ML injection 4 mg (4 mg Intravenous Given 12/04/22 0528)  ondansetron (ZOFRAN) injection 4 mg (4 mg Intravenous Given 12/04/22 0528)  morphine (PF) 4 MG/ML injection 4 mg (4 mg Intravenous Given 12/04/22 0620)    (Note:  hospital course my include additional interventions and/or labs/studies not listed above.)   Vital signs stable, initial ACS rule out was reassuring, signs/symptoms consistent with biliary colic similar to prior episodes.  Lab work is all reassuring with a slight hypokalemia but no elevated LFTs and a normal lipase.  No leukocytosis.  Will obtain right upper quadrant ultrasound to look for evidence of cholecystitis.  Medications as listed above including Toradol, morphine, and Zofran to help with symptoms.  Of note, patient required second dose of morphine 4 mg IV as documented above for pain control while awaiting ultrasound.  The patient is on the cardiac monitor to evaluate for evidence of arrhythmia and/or significant heart rate changes.   Clinical Course as of 12/04/22 1552  Sun Dec 04, 2022  1308 I viewed and interpreted the patient's  ultrasound of the abdomen.  She has gallstones but no evidence of cholecystitis based on gallbladder wall thickening nor pericholecystic fluid.  Radiology report confirms.  Radiologist pointed out that the common bile duct is slightly dilated, but she does not correlate clinically with choledocholithiasis given no elevated LFTs.  Her pain is better after medication and upon reassessment but she still feels the discomfort.  However she is comfortable with the plan for discharge given no radiographic evidence of cholecystitis.  She will follow-up as an outpatient with her insurance company to try to have an outpatient cholecystectomy.  I gave my usual and customary follow-up recommendations and return precautions. [CF]  Clinical Course User Index [CF] Loleta Rose, MD     FINAL CLINICAL IMPRESSION(S) / ED DIAGNOSES   Final diagnoses:  Biliary colic     Rx / DC Orders   ED Discharge Orders          Ordered    HYDROcodone-acetaminophen (NORCO/VICODIN) 5-325 MG tablet  Every 6 hours PRN        12/04/22 0706    ondansetron (ZOFRAN-ODT) 4 MG disintegrating tablet        12/04/22 2956             Note:  This document was prepared using Dragon voice recognition software and may include unintentional dictation errors.   Loleta Rose, MD 12/04/22 [phone]

## 2022-12-04 NOTE — Discharge Instructions (Addendum)
You have been seen in the Emergency Department (ED) for abdominal pain.  Your evaluation suggests that your pain is caused by gallstones.  Fortunately you do not need immediate surgery at this time, but it is important that you follow up with a surgeon as an outpatient; typically surgical removal of the gallbladder is the only thing that will definitively fix your issue.  Read through the included information about a bland diet, and use any prescribed medications as instructed.  Avoid smoking and alcohol use. ° °Please follow up as instructed above regarding today’s emergent visit and the symptoms that are bothering you. ° °Take Percocet as prescribed. Do not drink alcohol, drive or participate in any other potentially dangerous activities while taking this medication as it may make you sleepy. Do not take this medication with any other sedating medications, either prescription or over-the-counter. If you were prescribed Percocet or Vicodin, do not take these with acetaminophen (Tylenol) as it is already contained within these medications. °  °This medication is an opiate (or narcotic) pain medication and can be habit forming.  Use it as little as possible to achieve adequate pain control.  Do not use or use it with extreme caution if you have a history of opiate abuse or dependence.  If you are on a pain contract with your primary care doctor or a pain specialist, be sure to let them know you were prescribed this medication today from the [facility].  This medication is intended for your use only - do not give any to anyone else and keep it in a secure place where nobody else, especially children, have access to it.  It will also cause or worsen constipation, so you may want to consider taking an over-the-counter stool softener while you are taking this medication. ° °Return to the ED if your abdominal pain worsens or fails to improve, you develop bloody vomiting, bloody diarrhea, you  are unable to tolerate fluids due to vomiting, fever greater than 101, or other symptoms that concern you. ° °

## 2022-12-04 NOTE — ED Triage Notes (Signed)
Patient reports epigastric pain that woke her at 3 am.  Reports similar episode approximately a month ago.

## 2023-01-03 ENCOUNTER — Ambulatory Visit (INDEPENDENT_AMBULATORY_CARE_PROVIDER_SITE_OTHER): Payer: PRIVATE HEALTH INSURANCE | Admitting: General Surgery

## 2023-01-03 ENCOUNTER — Encounter: Payer: Self-pay | Admitting: General Surgery

## 2023-01-03 VITALS — BP 145/85 | HR 71 | Temp 98.2°F | Resp 12 | Ht 63.0 in | Wt 130.0 lb

## 2023-01-03 DIAGNOSIS — K8020 Calculus of gallbladder without cholecystitis without obstruction: Secondary | ICD-10-CM | POA: Insufficient documentation

## 2023-01-03 DIAGNOSIS — K8050 Calculus of bile duct without cholangitis or cholecystitis without obstruction: Secondary | ICD-10-CM

## 2023-01-03 DIAGNOSIS — K838 Other specified diseases of biliary tract: Secondary | ICD-10-CM

## 2023-01-03 NOTE — Patient Instructions (Addendum)
Laparoscopic cholecystectomy and Will plan for cholangiogram (dye study) of your common bile duct to look at it since it had grown from 3.7 mm to 9mm on your repeat US.

## 2023-01-03 NOTE — Progress Notes (Unsigned)
[facility] History and Physical  Reason for Referral:*** Referring Physician: ***  Chief Complaint   New Patient (Initial Visit)     Anna [name] is a 57 y.o. female.  HPI: ***.  The *** started *** and has had a duration of ***.  It is associated with ***.  The *** is improved with ***, and is made worse with ***.    Quality*** Context***  Past Medical History:  Diagnosis Date  . Allergy   . Anemia   . Anxiety   . Arthritis of low back   . Bilateral low back pain with right-sided sciatica   . Biliary colic   . Coronary artery disease 04/04/2022   a.) cCTA 04/04/2022: Ca2+ 1487 (99th percentile age/sex/race matched control) --> ctFFR suggested no significant stenosis.  . Depression   . Diastolic dysfunction 05/17/2022   a.) TTE 05/17/2022: EF 60-65%, no RWMAs, GLS -20.9%, PASP 42.2, triv MR, G1DD  . Family history of adverse reaction to anesthesia    a.) delayed emergence in second degree relative (maternal niece)  . Heart murmur   . History of tobacco use   . Hyperlipidemia   . Insomnia    a.) takes trazodone PRN  . Palpitations   . Primary hypertension 09/26/2022  . PSVT (paroxysmal supraventricular tachycardia)   . Seizure disorder [facility])    very rare and when it happens pt states she loses her vision short term-last episode around 2022  . Statin intolerance   . Tobacco abuse 07/18/2016    Past Surgical History:  Procedure Laterality Date  . CATARACT EXTRACTION, BILATERAL    . COLONOSCOPY  01/10/2012   Procedure: COLONOSCOPY;  Surgeon: Dalia Heading, MD;  Location: [facility] ENDO SUITE;  Service: Gastroenterology;  Laterality: N/A;  . CYST EXCISION Right    hand  . DILATION AND CURETTAGE OF UTERUS     Ablation  . FOOT SURGERY Left   . LUMBAR FUSION    . TONSILLECTOMY      Family History  Problem Relation Age of Onset  . Hypertension Mother   . Diabetes Mother   . Heart attack Mother 26  . Heart disease Mother   . Kidney disease Mother    . Hyperlipidemia Mother   . Non-Hodgkin's lymphoma Father        t cell  . Arthritis Father   . Hyperlipidemia Father   . Thyroid disease Daughter   . Breast cancer Cousin        in 45's  . Breast cancer Cousin        in 9's    Social History   Tobacco Use  . Smoking status: Every Day    Current packs/day: 0.50    Average packs/day: 0.5 packs/day for 38.7 years (19.3 ttl pk-yrs)    Types: Cigarettes    Start date: 05/09/1984  . Smokeless tobacco: Never  Vaping Use  . Vaping status: Never Used  Substance Use Topics  . Alcohol use: Not Currently  . Drug use: No    Medications: {medication reviewed/display:3041432} Allergies as of 01/03/2023       Reactions   Buspirone Other (See Comments)   Brain 'buzzes'   Lexapro [escitalopram] Other (See Comments)   Decreased sexual libido   Penicillins Other (See Comments)   Has patient had a PCN reaction causing immediate rash, facial/tongue/throat swelling, SOB or lightheadedness with hypotension: unknown Has patient had a PCN reaction causing severe rash involving mucus membranes or skin necrosis: unknown Has  patient had a PCN reaction that required hospitalization: No Has patient had a PCN reaction occurring within the last 10 years: No If all of the above answers are "NO", then may proceed with Cephalosporin use.   Statins Other (See Comments)   Body aches   Wellbutrin [bupropion] Itching        Medication List        Accurate as of January 03, 2023 11:28 AM. If you have any questions, ask your nurse or doctor.          aspirin EC 81 MG tablet Take 1 tablet (81 mg total) by mouth daily. Swallow whole.   cholecalciferol 25 MCG (1000 UNIT) tablet Commonly known as: VITAMIN D3 Take 2,000 Units by mouth daily.   citalopram 10 MG tablet Commonly known as: CELEXA Take 1 tablet (10 mg total) by mouth daily. What changed: when to take this   diclofenac Sodium 1 % Gel Commonly known as: VOLTAREN Apply 2 g  topically as needed.   ezetimibe 10 MG tablet Commonly known as: ZETIA Take 1 tablet (10 mg total) by mouth daily. What changed: when to take this   fenofibrate 160 MG tablet Take 160 mg by mouth every evening.   FISH OIL PO Take 1 tablet by mouth daily at 6 (six) AM.   HYDROcodone-acetaminophen 5-325 MG tablet Commonly known as: NORCO/VICODIN Take 2 tablets by mouth every 6 (six) hours as needed for moderate pain or severe pain.   loratadine 10 MG tablet Commonly known as: CLARITIN Take 10 mg by mouth daily as needed.   losartan 25 MG tablet Commonly known as: COZAAR Take 25 mg by mouth every morning.   OCUVITE PRESERVISION PO Take 1 tablet by mouth daily.   ondansetron 4 MG disintegrating tablet Commonly known as: ZOFRAN-ODT Allow 1-2 tablets to dissolve in your mouth every 8 hours as needed for nausea/vomiting   QC TUMERIC COMPLEX PO Take 1 tablet by mouth daily.   tiZANidine 4 MG tablet Commonly known as: ZANAFLEX Take 4 mg by mouth every 8 (eight) hours as needed.   traZODone 100 MG tablet Commonly known as: DESYREL Take 1 tablet (100 mg total) by mouth at bedtime.         ROS:  {Review of Systems:30496}  Blood pressure (!) 145/85, pulse 71, temperature 98.2 F (36.8 C), temperature source Oral, resp. rate 12, height 5\' 3"  (1.6 m), weight 130 lb (59 kg), SpO2 98%. Physical Exam  Results: CLINICAL DATA:  57 year old female with history of epigastric and right upper quadrant abdominal pain.   EXAM: ULTRASOUND ABDOMEN LIMITED RIGHT UPPER QUADRANT   COMPARISON:  Right upper quadrant abdominal ultrasound 10/23/2022.   FINDINGS: Gallbladder:   Multiple echogenic foci lying dependently with posterior acoustic shadowing, compatible with gallstones, largest of which measures 6 mm. Gallbladder is not distended. Gallbladder wall thickness is normal at 2 mm. No pericholecystic fluid. Per report from the sonographer, the patient was given pain medicine,  so accurate assessment for sonographic Murphy's sign was not possible on today's examination.   Common bile duct:   Diameter: 9 mm   Liver:   No focal lesion identified. Within normal limits in parenchymal echogenicity. Portal vein is patent on color Doppler imaging with normal direction of blood flow towards the liver.   Other: None.   IMPRESSION: 1. Study is positive for cholelithiasis, but there are no objective imaging findings suggestive of acute cholecystitis at this time. 2. Dilatation of the common bile duct (9 mm).  If there is any clinical concern for choledocholithiasis or other cause of biliary obstruction, further evaluation with abdominal MRI with and without IV gadolinium with MRCP should be considered.     Electronically Signed   By: Trudie Reed M.D.   On: 12/04/2022 06:25     Assessment & Plan:  Anna [name] is a 57 y.o. female with *** -*** -*** -Follow up ***  All questions were answered to the satisfaction of the patient and family***.  The risk and benefits of *** were discussed including but not limited to ***.  After careful consideration, Anna [name] has decided to ***.    [name] 01/03/2023, 11:28 AM

## 2023-01-04 DIAGNOSIS — K838 Other specified diseases of biliary tract: Secondary | ICD-10-CM | POA: Insufficient documentation

## 2023-01-04 NOTE — H&P (Signed)
[facility] History and Physical  Reason for Referral: Gallstones  Referring Physician: Self referral   Chief Complaint   New Patient (Initial Visit)     Anna [name] is a 57 y.o. female.  HPI: Anna [name] is a 57 yo who has been having gallbladder attacks for the past 2 months and ED visits to correspond with the worse attacks. She was scheduled to have a robotic cholecystectomy with Dr. Tonna Boehringer but this was canceled due to insurance not approving the cholecystectomy via robot per her report.   She is still having pain and her last visit to the ED her US demonstrated an enlarging CBD going from 3.33mm to 9mm on repeat US.   She denies any episodes of jaundice recently and has been managing her pain. She is ready to get this removed. She has RUQ pain and nausea/vomiting with her attacks.   Past Medical History:  Diagnosis Date   Allergy    Anemia    Anxiety    Arthritis of low back    Bilateral low back pain with right-sided sciatica    Biliary colic    Coronary artery disease 04/04/2022   a.) cCTA 04/04/2022: Ca2+ 1487 (99th percentile age/sex/race matched control) --> ctFFR suggested no significant stenosis.   Depression    Diastolic dysfunction 05/17/2022   a.) TTE 05/17/2022: EF 60-65%, no RWMAs, GLS -20.9%, PASP 42.2, triv MR, G1DD   Family history of adverse reaction to anesthesia    a.) delayed emergence in second degree relative (maternal niece)   Heart murmur    History of tobacco use    Hyperlipidemia    Insomnia    a.) takes trazodone PRN   Palpitations    Primary hypertension 09/26/2022   PSVT (paroxysmal supraventricular tachycardia)    Seizure disorder (HCC)    very rare and when it happens pt states she loses her vision short term-last episode around 2022   Statin intolerance    Tobacco abuse 07/18/2016    Past Surgical History:  Procedure Laterality Date   CATARACT EXTRACTION, BILATERAL     COLONOSCOPY  01/10/2012   Procedure:  COLONOSCOPY;  Surgeon: Dalia Heading, MD;  Location: [facility];  Service: Gastroenterology;  Laterality: N/A;   CYST EXCISION Right    hand   DILATION AND CURETTAGE OF UTERUS     Ablation   FOOT SURGERY Left    LUMBAR FUSION     TONSILLECTOMY      Family History  Problem Relation Age of Onset   Hypertension Mother    Diabetes Mother    Heart attack Mother 17   Heart disease Mother    Kidney disease Mother    Hyperlipidemia Mother    Non-Hodgkin's lymphoma Father        t cell   Arthritis Father    Hyperlipidemia Father    Thyroid disease Daughter    Breast cancer Cousin        in 7's   Breast cancer Cousin        in 54's    Social History   Tobacco Use   Smoking status: Every Day    Current packs/day: 0.50    Average packs/day: 0.5 packs/day for 38.7 years (19.3 ttl pk-yrs)    Types: Cigarettes    Start date: 05/09/1984   Smokeless tobacco: Never  Vaping Use   Vaping status: Never Used  Substance Use Topics   Alcohol use: Not Currently   Drug use: No  Medications: I have reviewed the patient's current medications. Allergies as of 01/03/2023       Reactions   Buspirone Other (See Comments)   Brain 'buzzes'   Lexapro [escitalopram] Other (See Comments)   Decreased sexual libido   Penicillins Other (See Comments)   Has patient had a PCN reaction causing immediate rash, facial/tongue/throat swelling, SOB or lightheadedness with hypotension: unknown Has patient had a PCN reaction causing severe rash involving mucus membranes or skin necrosis: unknown Has patient had a PCN reaction that required hospitalization: No Has patient had a PCN reaction occurring within the last 10 years: No If all of the above answers are "NO", then may proceed with Cephalosporin use.   Statins Other (See Comments)   Body aches   Wellbutrin [bupropion] Itching        Medication List        Accurate as of January 03, 2023 11:28 AM. If you have any questions, ask your nurse  or doctor.          aspirin EC 81 MG tablet Take 1 tablet (81 mg total) by mouth daily. Swallow whole.   cholecalciferol 25 MCG (1000 UNIT) tablet Commonly known as: VITAMIN D3 Take 2,000 Units by mouth daily.   citalopram 10 MG tablet Commonly known as: CELEXA Take 1 tablet (10 mg total) by mouth daily. What changed: when to take this   diclofenac Sodium 1 % Gel Commonly known as: VOLTAREN Apply 2 g topically as needed.   ezetimibe 10 MG tablet Commonly known as: ZETIA Take 1 tablet (10 mg total) by mouth daily. What changed: when to take this   fenofibrate 160 MG tablet Take 160 mg by mouth every evening.   FISH OIL PO Take 1 tablet by mouth daily at 6 (six) AM.   HYDROcodone-acetaminophen 5-325 MG tablet Commonly known as: NORCO/VICODIN Take 2 tablets by mouth every 6 (six) hours as needed for moderate pain or severe pain.   loratadine 10 MG tablet Commonly known as: CLARITIN Take 10 mg by mouth daily as needed.   losartan 25 MG tablet Commonly known as: COZAAR Take 25 mg by mouth every morning.   OCUVITE PRESERVISION PO Take 1 tablet by mouth daily.   ondansetron 4 MG disintegrating tablet Commonly known as: ZOFRAN-ODT Allow 1-2 tablets to dissolve in your mouth every 8 hours as needed for nausea/vomiting   QC TUMERIC COMPLEX PO Take 1 tablet by mouth daily.   tiZANidine 4 MG tablet Commonly known as: ZANAFLEX Take 4 mg by mouth every 8 (eight) hours as needed.   traZODone 100 MG tablet Commonly known as: DESYREL Take 1 tablet (100 mg total) by mouth at bedtime.         ROS:  A comprehensive review of systems was negative except for: Gastrointestinal: positive for abdominal pain, nausea, and reflux symptoms Musculoskeletal: positive for back pain and neck pain Neurological: positive for numbness  Blood pressure (!) 145/85, pulse 71, temperature 98.2 F (36.8 C), temperature source Oral, resp. rate 12, height 5\' 3"  (1.6 m), weight 130 lb  (59 kg), SpO2 98%. Physical Exam Vitals reviewed.  Constitutional:      Appearance: Normal appearance.  HENT:     Head: Normocephalic.     Nose: Nose normal.  Eyes:     Extraocular Movements: Extraocular movements intact.  Cardiovascular:     Rate and Rhythm: Normal rate and regular rhythm.  Pulmonary:     Effort: Pulmonary effort is normal.  Breath sounds: Normal breath sounds.  Abdominal:     General: There is no distension.     Palpations: Abdomen is soft.     Tenderness: There is abdominal tenderness in the right upper quadrant.  Musculoskeletal:        General: Normal range of motion.     Cervical back: Normal range of motion.  Skin:    General: Skin is warm.  Neurological:     General: No focal deficit present.     Mental Status: She is alert. Mental status is at baseline.  Psychiatric:        Mood and Affect: Mood normal.        Thought Content: Thought content normal.     Results: Personally reviewed imaging.  CLINICAL DATA:  57 year old female with history of epigastric and right upper quadrant abdominal pain.   EXAM: ULTRASOUND ABDOMEN LIMITED RIGHT UPPER QUADRANT   COMPARISON:  Right upper quadrant abdominal ultrasound 10/23/2022.   FINDINGS: Gallbladder:   Multiple echogenic foci lying dependently with posterior acoustic shadowing, compatible with gallstones, largest of which measures 6mm. Gallbladder is not distended. Gallbladder wall thickness is normal at 2 mm. No pericholecystic fluid. Per report from the sonographer, the patient was given pain medicine, so accurate assessment for sonographic Murphy's sign was not possible on today's examination.   Common bile duct:   Diameter: 9 mm   Liver:   No focal lesion identified. Within normal limits in parenchymal echogenicity. Portal vein is patent on color Doppler imaging with normal direction of blood flow towards the liver.   Other: None.   IMPRESSION: 1. Study is positive for  cholelithiasis, but there are no objective imaging findings suggestive of acute cholecystitis at this time. 2. Dilatation of the common bile duct (9 mm). If there is any clinical concern for choledocholithiasis or other cause of biliary obstruction, further evaluation with abdominal MRI with and without IV gadolinium with MRCP should be considered.     Electronically Signed   By: Trudie Reed M.D.   On: 12/04/2022 06:25   Prior US reviewed- CBD was 3.12mm in June 2024  Assessment & Plan:  Anna [name] is a 57 y.o. female with gallstones that are symptomatic and concern for choledocholithiasis given the enlarging size of her CBD with her last episode. She has a CBD diameter of 9mm which is up from 3.7 in June. She needs to get her gallbladder out as well as have a cholangiogram. Given the insurance not accepting the robotic approach will plan for a laparoscopic cholecystectomy and intraoperative cholangiogram.  PLAN: I counseled the patient about the indication, risks and benefits of  laparoscopic cholecystectomy.  She understands there is a very small chance for bleeding, infection, injury to normal structures (including common bile duct), conversion to open surgery, persistent symptoms, evolution of postcholecystectomy diarrhea, need for secondary interventions, anesthesia reaction, cardiopulmonary issues and other risks not specifically detailed here. Discussed the cholangiogram and risk of dye.  I described the expected recovery, the plan for follow-up and the restrictions during the recovery phase.  All questions were answered.    All questions were answered to the satisfaction of the patient.  Likely could return to work in a week or so.      [name] 01/03/2023, 11:28 AM

## 2023-01-10 NOTE — Patient Instructions (Signed)
Anna [name]  01/10/2023     @PREFPERIOPPHARMACY @   Your procedure is scheduled on  01/13/2023.   Report to Anna [name] at  1040  A.M.   Call this number if you have problems the morning of surgery:  [phone]  If you experience any cold or flu symptoms such as cough, fever, chills, shortness of breath, etc. between now and your scheduled surgery, please notify us at the above number.   Remember:  Do not eat or drink after midnight.      Take these medicines the morning of surgery with A SIP OF WATER            celexa, hydrocodone(if needed), zofran (if needed), zanaflex (if needed).     Do not wear jewelry, make-up or nail polish, including gel polish,  artificial nails, or any other type of covering on natural nails (fingers and  toes).  Do not wear lotions, powders, or perfumes, or deodorant.  Do not shave 48 hours prior to surgery.  Men may shave face and neck.  Do not bring valuables to the hospital.  [facility] is not responsible for any belongings or valuables.  Contacts, dentures or bridgework may not be worn into surgery.  Leave your suitcase in the car.  After surgery it may be brought to your room.  For patients admitted to the hospital, discharge time will be determined by your treatment team.  Patients discharged the day of surgery will not be allowed to drive home and must have someone with them for 24 hours.    Special instructions:   DO NOT smoke tobacco or vape for 24 hours before your procedure.  Please read over the following fact sheets that you were given. Coughing and Deep Breathing, Surgical Site Infection Prevention, Anesthesia Post-op Instructions, and Care and Recovery After Surgery      Minimally Invasive Cholecystectomy, Care After The following information offers guidance on how to care for yourself after your procedure. Your health care provider may also give you more specific instructions. If you have problems or questions,  contact your health care provider. What can I expect after the procedure? After the procedure, it is common to have: Pain at your incision sites. You will be given medicines to control this pain. Mild nausea or vomiting. Bloating and possible shoulder pain from the gas that was used during the procedure. Follow these instructions at home: Medicines Take over-the-counter and prescription medicines only as told by your health care provider. If you were prescribed an antibiotic medicine, take it as told by your health care provider. Do not stop using the antibiotic even if you start to feel better. Ask your health care provider if the medicine prescribed to you: Requires you to avoid driving or using machinery. Can cause constipation. You may need to take these actions to prevent or treat constipation: Drink enough fluid to keep your urine pale yellow. Take over-the-counter or prescription medicines. Eat foods that are high in fiber, such as beans, whole grains, and fresh fruits and vegetables. Limit foods that are high in fat and processed sugars, such as fried or sweet foods. Incision care  Follow instructions from your health care provider about how to take care of your incisions. Make sure you: Wash your hands with soap and water for at least 20 seconds before and after you change your bandage (dressing). If soap and water are not available, use hand sanitizer. Change your dressing as  told by your health care provider. Leave stitches (sutures), skin glue, or adhesive strips in place. These skin closures may need to be in place for 2 weeks or longer. If adhesive strip edges start to loosen and curl up, you may trim the loose edges. Do not remove adhesive strips completely unless your health care provider tells you to do that. Do not take baths, swim, or use a hot tub until your health care provider approves. Ask your health care provider if you may take showers. You may only be allowed to take  sponge baths. Check your incision area every day for signs of infection. Check for: More redness, swelling, or pain. Fluid or blood. Warmth. Pus or a bad smell. Activity Rest as told by your health care provider. Do not do activities that require a lot of effort. Avoid sitting for a long time without moving. Get up to take short walks every 1-2 hours. This is important to improve blood flow and breathing. Ask for help if you feel weak or unsteady. Do not lift anything that is heavier than 10 lb (4.5 kg), or the limit that you are told, until your health care provider says that it is safe. Do not play contact sports until your health care provider approves. Do not return to work or school until your health care provider approves. Return to your normal activities as told by your health care provider. Ask your health care provider what activities are safe for you. General instructions If you were given a sedative during the procedure, it can affect you for several hours. Do not drive or operate machinery until your health care provider says that it is safe. Keep all follow-up visits. This is important. Contact a health care provider if: You develop a rash. You have more redness, swelling, or pain around your incisions. You have fluid or blood coming from your incisions. Your incisions feel warm to the touch. You have pus or a bad smell coming from your incisions. You have a fever. One or more of your incisions breaks open. Get help right away if: You have trouble breathing. You have chest pain. You have more pain in your shoulders. You faint or feel dizzy when you stand. You have severe pain in your abdomen. You have nausea or vomiting that lasts for more than one day. You have leg pain that is new or unusual, or if it is localized to one specific spot. These symptoms may represent a serious problem that is an emergency. Do not wait to see if the symptoms will go away. Get medical help  right away. Call your local emergency services (911 in the [place] Do not drive yourself to the hospital. Summary After your procedure, it is common to have pain at the incision sites. You may also have nausea or bloating. Follow your health care provider's instructions about medicine, activity restrictions, and caring for your incision areas. Do not do activities that require a lot of effort. Contact a health care provider if you have a fever or other signs of infection, such as more redness, swelling, or pain around the incisions. Get help right away if you have chest pain, increasing pain in the shoulders, or trouble breathing. This information is not intended to replace advice given to you by your health care provider. Make sure you discuss any questions you have with your health care provider. Document Revised: 10/27/2020 Document Reviewed: 10/27/2020 Elsevier Patient Education  2024 [employer] General Anesthesia, Adult, Care After The  following information offers guidance on how to care for yourself after your procedure. Your health care provider may also give you more specific instructions. If you have problems or questions, contact your health care provider. What can I expect after the procedure? After the procedure, it is common for people to: Have pain or discomfort at the IV site. Have nausea or vomiting. Have a sore throat or hoarseness. Have trouble concentrating. Feel cold or chills. Feel weak, sleepy, or tired (fatigue). Have soreness and body aches. These can affect parts of the body that were not involved in surgery. Follow these instructions at home: For the time period you were told by your health care provider:  Rest. Do not participate in activities where you could fall or become injured. Do not drive or use machinery. Do not drink alcohol. Do not take sleeping pills or medicines that cause drowsiness. Do not make important decisions or sign legal documents. Do  not take care of children on your own. General instructions Drink enough fluid to keep your urine pale yellow. If you have sleep apnea, surgery and certain medicines can increase your risk for breathing problems. Follow instructions from your health care provider about wearing your sleep device: Anytime you are sleeping, including during daytime naps. While taking prescription pain medicines, sleeping medicines, or medicines that make you drowsy. Return to your normal activities as told by your health care provider. Ask your health care provider what activities are safe for you. Take over-the-counter and prescription medicines only as told by your health care provider. Do not use any products that contain nicotine or tobacco. These products include cigarettes, chewing tobacco, and vaping devices, such as e-cigarettes. These can delay incision healing after surgery. If you need help quitting, ask your health care provider. Contact a health care provider if: You have nausea or vomiting that does not get better with medicine. You vomit every time you eat or drink. You have pain that does not get better with medicine. You cannot urinate or have bloody urine. You develop a skin rash. You have a fever. Get help right away if: You have trouble breathing. You have chest pain. You vomit blood. These symptoms may be an emergency. Get help right away. Call 911. Do not wait to see if the symptoms will go away. Do not drive yourself to the hospital. Summary After the procedure, it is common to have a sore throat, hoarseness, nausea, vomiting, or to feel weak, sleepy, or fatigue. For the time period you were told by your health care provider, do not drive or use machinery. Get help right away if you have difficulty breathing, have chest pain, or vomit blood. These symptoms may be an emergency. This information is not intended to replace advice given to you by your health care provider. Make sure you  discuss any questions you have with your health care provider. Document Revised: 07/23/2021 Document Reviewed: 07/23/2021 Elsevier Patient Education  2024 [employer]. How to Use Chlorhexidine Before Surgery Chlorhexidine gluconate (CHG) is a germ-killing (antiseptic) solution that is used to clean the skin. It can get rid of the bacteria that normally live on the skin and can keep them away for about 24 hours. To clean your skin with CHG, you may be given: A CHG solution to use in the shower or as part of a sponge bath. A prepackaged cloth that contains CHG. Cleaning your skin with CHG may help lower the risk for infection: While you are staying in the intensive  care unit of the hospital. If you have a vascular access, such as a central line, to provide short-term or long-term access to your veins. If you have a catheter to drain urine from your bladder. If you are on a ventilator. A ventilator is a machine that helps you breathe by moving air in and out of your lungs. After surgery. What are the risks? Risks of using CHG include: A skin reaction. Hearing loss, if CHG gets in your ears and you have a perforated eardrum. Eye injury, if CHG gets in your eyes and is not rinsed out. The CHG product catching fire. Make sure that you avoid smoking and flames after applying CHG to your skin. Do not use CHG: If you have a chlorhexidine allergy or have previously reacted to chlorhexidine. On babies younger than 19 months of age. How to use CHG solution Use CHG only as told by your health care provider, and follow the instructions on the label. Use the full amount of CHG as directed. Usually, this is one bottle. During a shower Follow these steps when using CHG solution during a shower (unless your health care provider gives you different instructions): Start the shower. Use your normal soap and shampoo to wash your face and hair. Turn off the shower or move out of the shower stream. Pour the  CHG onto a clean washcloth. Do not use any type of brush or rough-edged sponge. Starting at your neck, lather your body down to your toes. Make sure you follow these instructions: If you will be having surgery, pay special attention to the part of your body where you will be having surgery. Scrub this area for at least 1 minute. Do not use CHG on your head or face. If the solution gets into your ears or eyes, rinse them well with water. Avoid your genital area. Avoid any areas of skin that have broken skin, cuts, or scrapes. Scrub your back and under your arms. Make sure to wash skin folds. Let the lather sit on your skin for 1-2 minutes or as long as told by your health care provider. Thoroughly rinse your entire body in the shower. Make sure that all body creases and crevices are rinsed well. Dry off with a clean towel. Do not put any substances on your body afterward--such as powder, lotion, or perfume--unless you are told to do so by your health care provider. Only use lotions that are recommended by the manufacturer. Put on clean clothes or pajamas. If it is the night before your surgery, sleep in clean sheets.  During a sponge bath Follow these steps when using CHG solution during a sponge bath (unless your health care provider gives you different instructions): Use your normal soap and shampoo to wash your face and hair. Pour the CHG onto a clean washcloth. Starting at your neck, lather your body down to your toes. Make sure you follow these instructions: If you will be having surgery, pay special attention to the part of your body where you will be having surgery. Scrub this area for at least 1 minute. Do not use CHG on your head or face. If the solution gets into your ears or eyes, rinse them well with water. Avoid your genital area. Avoid any areas of skin that have broken skin, cuts, or scrapes. Scrub your back and under your arms. Make sure to wash skin folds. Let the lather sit on  your skin for 1-2 minutes or as long as told by your  health care provider. Using a different clean, wet washcloth, thoroughly rinse your entire body. Make sure that all body creases and crevices are rinsed well. Dry off with a clean towel. Do not put any substances on your body afterward--such as powder, lotion, or perfume--unless you are told to do so by your health care provider. Only use lotions that are recommended by the manufacturer. Put on clean clothes or pajamas. If it is the night before your surgery, sleep in clean sheets. How to use CHG prepackaged cloths Only use CHG cloths as told by your health care provider, and follow the instructions on the label. Use the CHG cloth on clean, dry skin. Do not use the CHG cloth on your head or face unless your health care provider tells you to. When washing with the CHG cloth: Avoid your genital area. Avoid any areas of skin that have broken skin, cuts, or scrapes. Before surgery Follow these steps when using a CHG cloth to clean before surgery (unless your health care provider gives you different instructions): Using the CHG cloth, vigorously scrub the part of your body where you will be having surgery. Scrub using a back-and-forth motion for 3 minutes. The area on your body should be completely wet with CHG when you are done scrubbing. Do not rinse. Discard the cloth and let the area air-dry. Do not put any substances on the area afterward, such as powder, lotion, or perfume. Put on clean clothes or pajamas. If it is the night before your surgery, sleep in clean sheets.  For general bathing Follow these steps when using CHG cloths for general bathing (unless your health care provider gives you different instructions). Use a separate CHG cloth for each area of your body. Make sure you wash between any folds of skin and between your fingers and toes. Wash your body in the following order, switching to a new cloth after each step: The front of  your neck, shoulders, and chest. Both of your arms, under your arms, and your hands. Your stomach and groin area, avoiding the genitals. Your right leg and foot. Your left leg and foot. The back of your neck, your back, and your buttocks. Do not rinse. Discard the cloth and let the area air-dry. Do not put any substances on your body afterward--such as powder, lotion, or perfume--unless you are told to do so by your health care provider. Only use lotions that are recommended by the manufacturer. Put on clean clothes or pajamas. Contact a health care provider if: Your skin gets irritated after scrubbing. You have questions about using your solution or cloth. You swallow any chlorhexidine. Call your local poison control center ([phone] in the [place] Get help right away if: Your eyes itch badly, or they become very red or swollen. Your skin itches badly and is red or swollen. Your hearing changes. You have trouble seeing. You have swelling or tingling in your mouth or throat. You have trouble breathing. These symptoms may represent a serious problem that is an emergency. Do not wait to see if the symptoms will go away. Get medical help right away. Call your local emergency services (911 in the [place] Do not drive yourself to the hospital. Summary Chlorhexidine gluconate (CHG) is a germ-killing (antiseptic) solution that is used to clean the skin. Cleaning your skin with CHG may help to lower your risk for infection. You may be given CHG to use for bathing. It may be in a bottle or in a prepackaged cloth to  use on your skin. Carefully follow your health care provider's instructions and the instructions on the product label. Do not use CHG if you have a chlorhexidine allergy. Contact your health care provider if your skin gets irritated after scrubbing. This information is not intended to replace advice given to you by your health care provider. Make sure you discuss any questions you have  with your health care provider. Document Revised: 08/23/2021 Document Reviewed: 07/06/2020 Elsevier Patient Education  2023 [employer].

## 2023-01-11 ENCOUNTER — Encounter (HOSPITAL_COMMUNITY)
Admission: RE | Admit: 2023-01-11 | Discharge: 2023-01-11 | Disposition: A | Discharge status: Home / Self Care | Payer: PRIVATE HEALTH INSURANCE | Source: Ambulatory Visit | Attending: General Surgery | Admitting: General Surgery

## 2023-01-11 ENCOUNTER — Encounter (HOSPITAL_COMMUNITY): Payer: Self-pay

## 2023-01-11 VITALS — BP 145/85 | HR 71 | Temp 98.2°F | Resp 16 | Ht 63.0 in | Wt 130.0 lb

## 2023-01-11 DIAGNOSIS — Z01818 Encounter for other preprocedural examination: Secondary | ICD-10-CM | POA: Diagnosis present

## 2023-01-11 DIAGNOSIS — K8020 Calculus of gallbladder without cholecystitis without obstruction: Secondary | ICD-10-CM | POA: Insufficient documentation

## 2023-01-11 DIAGNOSIS — Z01812 Encounter for preprocedural laboratory examination: Secondary | ICD-10-CM | POA: Diagnosis not present

## 2023-01-11 DIAGNOSIS — K838 Other specified diseases of biliary tract: Secondary | ICD-10-CM | POA: Insufficient documentation

## 2023-01-11 LAB — CBC WITH DIFFERENTIAL/PLATELET
Abs Immature Granulocytes: 0.03 10*3/uL (ref 0.00–0.07)
Basophils Absolute: 0.1 10*3/uL (ref 0.0–0.1)
Basophils Relative: 1 %
Eosinophils Absolute: 0.1 10*3/uL (ref 0.0–0.5)
Eosinophils Relative: 2 %
HCT: 34 % — ABNORMAL LOW (ref 36.0–46.0)
Hemoglobin: 11.2 g/dL — ABNORMAL LOW (ref 12.0–15.0)
Immature Granulocytes: 1 %
Lymphocytes Relative: 34 %
Lymphs Abs: 2 10*3/uL (ref 0.7–4.0)
MCH: 30.4 pg (ref 26.0–34.0)
MCHC: 32.9 g/dL (ref 30.0–36.0)
MCV: 92.4 fL (ref 80.0–100.0)
Monocytes Absolute: 0.3 10*3/uL (ref 0.1–1.0)
Monocytes Relative: 5 %
Neutro Abs: 3.4 10*3/uL (ref 1.7–7.7)
Neutrophils Relative %: 57 %
Platelets: 275 10*3/uL (ref 150–400)
RBC: 3.68 MIL/uL — ABNORMAL LOW (ref 3.87–5.11)
RDW: 12.5 % (ref 11.5–15.5)
WBC: 5.8 10*3/uL (ref 4.0–10.5)
nRBC: 0 % (ref 0.0–0.2)

## 2023-01-11 LAB — COMPREHENSIVE METABOLIC PANEL
ALT: 20 U/L (ref 0–44)
AST: 22 U/L (ref 15–41)
Albumin: 4.5 g/dL (ref 3.5–5.0)
Alkaline Phosphatase: 50 U/L (ref 38–126)
Anion gap: 10 (ref 5–15)
BUN: 23 mg/dL — ABNORMAL HIGH (ref 6–20)
CO2: 25 mmol/L (ref 22–32)
Calcium: 9.8 mg/dL (ref 8.9–10.3)
Chloride: 104 mmol/L (ref 98–111)
Creatinine, Ser: 0.75 mg/dL (ref 0.44–1.00)
GFR, Estimated: >60 mL/min (ref >60)
Glucose, Bld: 99 mg/dL (ref 70–99)
Potassium: 3.7 mmol/L (ref 3.5–5.1)
Sodium: 139 mmol/L (ref 135–145)
Total Bilirubin: 0.8 mg/dL (ref 0.3–1.2)
Total Protein: 7.2 g/dL (ref 6.5–8.1)

## 2023-01-11 LAB — POCT PREGNANCY, URINE: Preg Test, Ur: NEGATIVE

## 2023-01-13 ENCOUNTER — Ambulatory Visit (HOSPITAL_COMMUNITY)
Admission: RE | Admit: 2023-01-13 | Discharge: 2023-01-13 | Disposition: A | Discharge status: Home / Self Care | Payer: PRIVATE HEALTH INSURANCE | Attending: General Surgery | Admitting: General Surgery

## 2023-01-13 ENCOUNTER — Encounter (HOSPITAL_COMMUNITY)
Admission: RE | Disposition: A | Discharge status: Home / Self Care | Payer: Self-pay | Source: Home / Self Care | Attending: General Surgery

## 2023-01-13 ENCOUNTER — Ambulatory Visit (HOSPITAL_COMMUNITY): Payer: PRIVATE HEALTH INSURANCE | Admitting: Anesthesiology

## 2023-01-13 ENCOUNTER — Ambulatory Visit (HOSPITAL_BASED_OUTPATIENT_CLINIC_OR_DEPARTMENT_OTHER): Payer: PRIVATE HEALTH INSURANCE | Admitting: Anesthesiology

## 2023-01-13 ENCOUNTER — Encounter (HOSPITAL_COMMUNITY): Payer: Self-pay | Admitting: General Surgery

## 2023-01-13 ENCOUNTER — Ambulatory Visit (HOSPITAL_COMMUNITY): Payer: PRIVATE HEALTH INSURANCE

## 2023-01-13 DIAGNOSIS — K8064 Calculus of gallbladder and bile duct with chronic cholecystitis without obstruction: Secondary | ICD-10-CM | POA: Insufficient documentation

## 2023-01-13 DIAGNOSIS — I2510 Atherosclerotic heart disease of native coronary artery without angina pectoris: Secondary | ICD-10-CM | POA: Insufficient documentation

## 2023-01-13 DIAGNOSIS — I10 Essential (primary) hypertension: Secondary | ICD-10-CM | POA: Diagnosis not present

## 2023-01-13 DIAGNOSIS — F32A Depression, unspecified: Secondary | ICD-10-CM | POA: Insufficient documentation

## 2023-01-13 DIAGNOSIS — F419 Anxiety disorder, unspecified: Secondary | ICD-10-CM | POA: Insufficient documentation

## 2023-01-13 DIAGNOSIS — G40909 Epilepsy, unspecified, not intractable, without status epilepticus: Secondary | ICD-10-CM | POA: Insufficient documentation

## 2023-01-13 DIAGNOSIS — K838 Other specified diseases of biliary tract: Secondary | ICD-10-CM | POA: Diagnosis not present

## 2023-01-13 DIAGNOSIS — F17210 Nicotine dependence, cigarettes, uncomplicated: Secondary | ICD-10-CM | POA: Diagnosis not present

## 2023-01-13 DIAGNOSIS — Z01818 Encounter for other preprocedural examination: Secondary | ICD-10-CM

## 2023-01-13 DIAGNOSIS — K8020 Calculus of gallbladder without cholecystitis without obstruction: Secondary | ICD-10-CM

## 2023-01-13 HISTORY — PX: CHOLECYSTECTOMY: SHX55

## 2023-01-13 SURGERY — LAPAROSCOPIC CHOLECYSTECTOMY WITH INTRAOPERATIVE CHOLANGIOGRAM
Anesthesia: General | Site: Abdomen

## 2023-01-13 MED ORDER — SODIUM CHLORIDE 0.9 % IV SOLN
2.0000 g | INTRAVENOUS | Status: AC
  Administered 2023-01-13: 2 g via INTRAVENOUS

## 2023-01-13 MED ORDER — DEXAMETHASONE SODIUM PHOSPHATE 4 MG/ML IJ SOLN
INTRAMUSCULAR | Status: DC | PRN
  Administered 2023-01-13: 5 mg via INTRAVENOUS

## 2023-01-13 MED ORDER — HEMOSTATIC AGENTS (NO CHARGE) OPTIME
TOPICAL | Status: DC | PRN
  Administered 2023-01-13: 1 via TOPICAL

## 2023-01-13 MED ORDER — CHLORHEXIDINE GLUCONATE 0.12 % MT SOLN
15.0000 mL | Freq: Once | OROMUCOSAL | Status: AC
  Administered 2023-01-13: 15 mL via OROMUCOSAL

## 2023-01-13 MED ORDER — ONDANSETRON HCL 4 MG/2ML IJ SOLN
INTRAMUSCULAR | Status: DC | PRN
  Administered 2023-01-13: 4 mg via INTRAVENOUS

## 2023-01-13 MED ORDER — FENTANYL CITRATE (PF) 250 MCG/5ML IJ SOLN
INTRAMUSCULAR | Status: DC | PRN
  Administered 2023-01-13: 50 ug via INTRAVENOUS
  Administered 2023-01-13: 100 ug via INTRAVENOUS
  Administered 2023-01-13 (×2): 50 ug via INTRAVENOUS

## 2023-01-13 MED ORDER — DEXMEDETOMIDINE HCL IN NACL 80 MCG/20ML IV SOLN
INTRAVENOUS | Status: DC | PRN
Start: 2023-01-13 — End: 2023-01-13
  Administered 2023-01-13: 8 ug via INTRAVENOUS
  Administered 2023-01-13: 12 ug via INTRAVENOUS

## 2023-01-13 MED ORDER — KETOROLAC TROMETHAMINE 30 MG/ML IJ SOLN
INTRAMUSCULAR | Status: DC | PRN
Start: 2023-01-13 — End: 2023-01-13
  Administered 2023-01-13: 30 mg via INTRAVENOUS

## 2023-01-13 MED ORDER — DEXAMETHASONE SODIUM PHOSPHATE 10 MG/ML IJ SOLN
INTRAMUSCULAR | Status: AC
  Filled 2023-01-13: qty 1

## 2023-01-13 MED ORDER — ONDANSETRON HCL 4 MG PO TABS: 4.0000 mg | ORAL_TABLET | Freq: Three times a day (TID) | ORAL | 1 refills | Status: DC | PRN

## 2023-01-13 MED ORDER — SODIUM CHLORIDE 0.9 % IV SOLN
INTRAVENOUS | Status: AC
  Filled 2023-01-13: qty 2

## 2023-01-13 MED ORDER — PROPOFOL 10 MG/ML IV BOLUS
INTRAVENOUS | Status: AC
  Filled 2023-01-13: qty 20

## 2023-01-13 MED ORDER — HYDROMORPHONE HCL 1 MG/ML IJ SOLN: 0.2500 mg | INTRAMUSCULAR | Status: DC | PRN

## 2023-01-13 MED ORDER — BUPIVACAINE HCL (PF) 0.5 % IJ SOLN
INTRAMUSCULAR | Status: DC | PRN
  Administered 2023-01-13: 30 mL

## 2023-01-13 MED ORDER — PROPOFOL 10 MG/ML IV BOLUS
INTRAVENOUS | Status: DC | PRN
  Administered 2023-01-13: 140 mg via INTRAVENOUS

## 2023-01-13 MED ORDER — 0.9 % SODIUM CHLORIDE (POUR BTL) OPTIME
TOPICAL | Status: DC | PRN
  Administered 2023-01-13: 1000 mL

## 2023-01-13 MED ORDER — FENTANYL CITRATE (PF) 250 MCG/5ML IJ SOLN
INTRAMUSCULAR | Status: AC
  Filled 2023-01-13: qty 5

## 2023-01-13 MED ORDER — MIDAZOLAM HCL 2 MG/2ML IJ SOLN
INTRAMUSCULAR | Status: AC
  Filled 2023-01-13: qty 2

## 2023-01-13 MED ORDER — ROCURONIUM BROMIDE 10 MG/ML (PF) SYRINGE
PREFILLED_SYRINGE | INTRAVENOUS | Status: AC
  Filled 2023-01-13: qty 10

## 2023-01-13 MED ORDER — ORAL CARE MOUTH RINSE: 15.0000 mL | Freq: Once | OROMUCOSAL | Status: AC

## 2023-01-13 MED ORDER — SCOPOLAMINE 1 MG/3DAYS TD PT72
MEDICATED_PATCH | TRANSDERMAL | Status: AC
  Administered 2023-01-13: 1.5 mg via TRANSDERMAL
  Filled 2023-01-13: qty 1

## 2023-01-13 MED ORDER — ONDANSETRON HCL 4 MG/2ML IJ SOLN: 4.0000 mg | Freq: Once | INTRAMUSCULAR | Status: DC | PRN

## 2023-01-13 MED ORDER — ROCURONIUM BROMIDE 10 MG/ML (PF) SYRINGE
PREFILLED_SYRINGE | INTRAVENOUS | Status: DC | PRN
  Administered 2023-01-13: 50 mg via INTRAVENOUS

## 2023-01-13 MED ORDER — ONDANSETRON HCL 4 MG/2ML IJ SOLN
INTRAMUSCULAR | Status: AC
  Filled 2023-01-13: qty 2

## 2023-01-13 MED ORDER — OXYCODONE HCL 5 MG PO TABS: 5.0000 mg | ORAL_TABLET | ORAL | 0 refills | Status: DC | PRN

## 2023-01-13 MED ORDER — LIDOCAINE HCL (PF) 2 % IJ SOLN
INTRAMUSCULAR | Status: AC
  Filled 2023-01-13: qty 5

## 2023-01-13 MED ORDER — LIDOCAINE 2% (20 MG/ML) 5 ML SYRINGE
INTRAMUSCULAR | Status: DC | PRN
  Administered 2023-01-13: 60 mg via INTRAVENOUS

## 2023-01-13 MED ORDER — MEPERIDINE HCL 50 MG/ML IJ SOLN: 6.2500 mg | INTRAMUSCULAR | Status: DC | PRN

## 2023-01-13 MED ORDER — MIDAZOLAM HCL 2 MG/2ML IJ SOLN
INTRAMUSCULAR | Status: DC | PRN
  Administered 2023-01-13: 2 mg via INTRAVENOUS

## 2023-01-13 MED ORDER — CHLORHEXIDINE GLUCONATE CLOTH 2 % EX PADS: 6.0000 | MEDICATED_PAD | Freq: Once | CUTANEOUS | Status: DC

## 2023-01-13 MED ORDER — SCOPOLAMINE 1 MG/3DAYS TD PT72: 1.0000 | MEDICATED_PATCH | TRANSDERMAL | Status: DC

## 2023-01-13 MED ORDER — SUGAMMADEX SODIUM 200 MG/2ML IV SOLN
INTRAVENOUS | Status: DC | PRN
  Administered 2023-01-13: 200 mg via INTRAVENOUS

## 2023-01-13 MED ORDER — LACTATED RINGERS IV SOLN
INTRAVENOUS | Status: DC
  Administered 2023-01-13: 1000 mL via INTRAVENOUS

## 2023-01-13 MED ORDER — INDOCYANINE GREEN 25 MG IV SOLR: 1.2500 mg | Freq: Once | INTRAVENOUS | Status: DC

## 2023-01-13 MED ORDER — BUPIVACAINE HCL (PF) 0.5 % IJ SOLN
INTRAMUSCULAR | Status: AC
  Filled 2023-01-13: qty 30

## 2023-01-13 MED ORDER — SODIUM CHLORIDE (PF) 0.9 % IJ SOLN
INTRAMUSCULAR | Status: DC | PRN
  Administered 2023-01-13: 12 mL

## 2023-01-13 SURGICAL SUPPLY — 46 items
ADH SKN CLS APL DERMABOND .7 (GAUZE/BANDAGES/DRESSINGS) ×1
APL PRP STRL LF DISP 70% ISPRP (MISCELLANEOUS) ×1
APPLIER CLIP ROT 10 11.4 M/L (STAPLE) ×1
APR CLP MED LRG 11.4X10 (STAPLE) ×1
BAG DECANTER FOR FLEXI CONT (MISCELLANEOUS) ×1 IMPLANT
BLADE SURG 15 STRL LF DISP TIS (BLADE) ×1 IMPLANT
BLADE SURG 15 STRL SS (BLADE) ×1
CATH CHOLANGIOGRAM 4.5FR (CATHETERS) IMPLANT
CHLORAPREP W/TINT 26 (MISCELLANEOUS) ×1 IMPLANT
CLIP APPLIE ROT 10 11.4 M/L (STAPLE) ×1 IMPLANT
CLOTH BEACON ORANGE TIMEOUT ST (SAFETY) ×1 IMPLANT
COVER LIGHT HANDLE STERIS (MISCELLANEOUS) ×2 IMPLANT
DECANTER SPIKE VIAL GLASS SM (MISCELLANEOUS) ×2 IMPLANT
DERMABOND ADVANCED .7 DNX12 (GAUZE/BANDAGES/DRESSINGS) ×1 IMPLANT
DRAPE C-ARM FOLDED MOBILE STRL (DRAPES) ×1 IMPLANT
ELECT REM PT RETURN 9FT ADLT (ELECTROSURGICAL) ×1
ELECTRODE REM PT RTRN 9FT ADLT (ELECTROSURGICAL) ×1 IMPLANT
GLOVE BIO SURGEON STRL SZ 6.5 (GLOVE) ×1 IMPLANT
GLOVE BIOGEL PI IND STRL 6.5 (GLOVE) ×1 IMPLANT
GLOVE BIOGEL PI IND STRL 7.0 (GLOVE) ×2 IMPLANT
GOWN STRL REUS W/TWL LRG LVL3 (GOWN DISPOSABLE) ×3 IMPLANT
HEMOSTAT SNOW SURGICEL 2X4 (HEMOSTASIS) ×1 IMPLANT
INST SET LAPROSCOPIC AP (KITS) ×1 IMPLANT
IV NS 500ML (IV SOLUTION) ×1
IV NS 500ML BAXH (IV SOLUTION) ×1 IMPLANT
KIT TURNOVER KIT A (KITS) ×1 IMPLANT
MANIFOLD NEPTUNE II (INSTRUMENTS) ×1 IMPLANT
NEEDLE INSUFFLATION 120MM (ENDOMECHANICALS) ×1 IMPLANT
NS IRRIG 1000ML POUR BTL (IV SOLUTION) ×1 IMPLANT
PACK LAP CHOLE LZT030E (CUSTOM PROCEDURE TRAY) ×1 IMPLANT
PAD ARMBOARD 7.5X6 YLW CONV (MISCELLANEOUS) ×1 IMPLANT
POSITIONER HEAD 8X9X4 ADT (SOFTGOODS) ×1 IMPLANT
SET BASIN LINEN APH (SET/KITS/TRAYS/PACK) ×1 IMPLANT
SET TUBE SMOKE EVAC HIGH FLOW (TUBING) ×1 IMPLANT
SLEEVE Z-THREAD 5X100MM (TROCAR) ×1 IMPLANT
SUT MNCRL AB 4-0 PS2 18 (SUTURE) ×2 IMPLANT
SUT VICRYL 0 UR6 27IN ABS (SUTURE) ×1 IMPLANT
SYR 30ML LL (SYRINGE) ×3 IMPLANT
SYR CONTROL 10ML LL (SYRINGE) ×1 IMPLANT
SYS BAG RETRIEVAL 10MM (BASKET) ×1
SYSTEM BAG RETRIEVAL 10MM (BASKET) ×1 IMPLANT
TROCAR Z-THRD FIOS HNDL 11X100 (TROCAR) ×1 IMPLANT
TROCAR Z-THREAD FIOS 5X100MM (TROCAR) ×1 IMPLANT
TROCAR Z-THREAD SLEEVE 11X100 (TROCAR) ×1 IMPLANT
TUBE CONNECTING 12X1/4 (SUCTIONS) ×1 IMPLANT
WARMER LAPAROSCOPE (MISCELLANEOUS) ×1 IMPLANT

## 2023-01-13 NOTE — Anesthesia Preprocedure Evaluation (Signed)
Anesthesia Evaluation  Patient identified by MRN, date of birth, ID band Patient awake    Reviewed: Allergy & Precautions, H&P , NPO status , Patient's Chart, lab work & pertinent test results  History of Anesthesia Complications (+) PROLONGED EMERGENCE, Family history of anesthesia reaction and history of anesthetic complications  Airway Mallampati: II  TM Distance: >3 FB Neck ROM: Full    Dental  (+) Dental Advisory Given, Poor Dentition, Loose, Missing,    Pulmonary neg pulmonary ROS, Current Smoker and Patient abstained from smoking.   Pulmonary exam normal breath sounds clear to auscultation       Cardiovascular Exercise Tolerance: Good hypertension, Pt. on medications + CAD  + dysrhythmias Supra Ventricular Tachycardia + Valvular Problems/Murmurs  Rhythm:Regular Rate:Normal  04-Dec-2022 04:35:27 [facility]-ED ROUTINE RECORD 08/30/1965 (57 yr) Female Caucasian Test YQM:[id number] pain Vent. rate 68 BPM PR interval 156 ms QRS duration 92 ms QT/QTcB 410/435 ms P-R-T axes 72 -32 36 Sinus rhythm with occasional Premature ventricular complexes Possible Left atrial enlargement Left axis deviation Incomplete right bundle branch block Septal infarct , age undetermined Abnormal ECG When compared with ECG of 23-Oct-2022 02:07, Premature ventricular complexes are now Present Septal infarct is now Present Confirmed by UNCONFIRMED, DOCTOR ([id number]), editor Fredric Mare, Babette Relic [phone]) on 12/05/2022 2:49:11 PM   Neuro/Psych Seizures - (very rare and when it happens pt states she loses her vision short term-last episode around 2022),  PSYCHIATRIC DISORDERS Anxiety Depression     Neuromuscular disease (right sided sciatica)    GI/Hepatic negative GI ROS, Neg liver ROS,,,  Endo/Other  negative endocrine ROS    Renal/GU negative Renal ROS  negative genitourinary   Musculoskeletal  (+) Arthritis , Osteoarthritis,     Abdominal   Peds negative pediatric ROS (+)  Hematology  (+) Blood dyscrasia, anemia   Anesthesia Other Findings Back pain, lumbar fusion   Reproductive/Obstetrics negative OB ROS                             Anesthesia Physical Anesthesia Plan  ASA: 2  Anesthesia Plan: General   Post-op Pain Management: Dilaudid IV   Induction: Intravenous  PONV Risk Score and Plan: 4 or greater and Ondansetron, Dexamethasone, Midazolam and Scopolamine patch - Pre-op  Airway Management Planned: Oral ETT  Additional Equipment:   Intra-op Plan:   Post-operative Plan: Extubation in OR  Informed Consent: I have reviewed the patients History and Physical, chart, labs and discussed the procedure including the risks, benefits and alternatives for the proposed anesthesia with the patient or authorized representative who has indicated his/her understanding and acceptance.     Dental advisory given  Plan Discussed with: CRNA and Surgeon  Anesthesia Plan Comments:         Anesthesia Quick Evaluation

## 2023-01-13 NOTE — Interval H&P Note (Signed)
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  The various methods of treatment have been discussed with the patient and family. After consideration of risks, benefits and other options for treatment, the patient has consented to  Procedure(s): LAPAROSCOPIC CHOLECYSTECTOMY WITH INTRAOPERATIVE CHOLANGIOGRAM (N/A) as a surgical intervention.  The patient's history has been reviewed, patient examined, no change in status, stable for surgery.  I have reviewed the patient's chart and labs.  Questions were answered to the patient's satisfaction.     [name]

## 2023-01-13 NOTE — Transfer of Care (Signed)
Immediate Anesthesia Transfer of Care Note  Patient: Anna [name]  Procedure(s) Performed: LAPAROSCOPIC CHOLECYSTECTOMY WITH INTRAOPERATIVE CHOLANGIOGRAM (Abdomen)  Patient Location: PACU  Anesthesia Type:General  Level of Consciousness: awake, alert , and oriented  Airway & Oxygen Therapy: Patient Spontanous Breathing and Patient connected to face mask oxygen  Post-op Assessment: Report given to RN and Post -op Vital signs reviewed and stable  Post vital signs: Reviewed and stable  Last Vitals:  Vitals Value Taken Time  BP    Temp    Pulse    Resp    SpO2      Last Pain:  Vitals:   01/13/23 1051  TempSrc: Oral  PainSc: 0-No pain      Patients Stated Pain Goal: 8 (01/13/23 1051)  Complications: No notable events documented.

## 2023-01-13 NOTE — Progress Notes (Signed)
[facility]  Updated family. Cholangiogram without stones. Official read pending.  Will do phone call on 9/19. Rx to [facility]  Algis Greenhouse, MD [facility] [address] [place], [place] [address] [phone] (office)

## 2023-01-13 NOTE — Op Note (Signed)
Operative Note   Preoperative Diagnosis: Symptomatic cholelithiasis, dilated common bile duct    Postoperative Diagnosis: Same   Procedure(s) Performed: Laparoscopic cholecystectomy, intraoperative cholangiogram    Surgeon: Leatrice Jewels. Henreitta Leber, MD   Assistants: No qualified resident was available   Anesthesia: General endotracheal   Anesthesiologist: Dr. Alva Garnet, MD    Specimens: Gallbladder    Estimated Blood Loss: Minimal    Blood Replacement: None    Complications: None    Operative Findings: Normal intraoperative cholangiogram, hepatic duct noted, common duct with no obvious stones    Procedure: The patient was taken to the operating room and placed supine. General endotracheal anesthesia was induced. Intravenous antibiotics were administered per protocol. An orogastric tube positioned to decompress the stomach. The abdomen was prepared and draped in the usual sterile fashion.    A supraumbilical incision was made and a Veress technique was utilized to achieve pneumoperitoneum to 15 mmHg with carbon dioxide. A 11 mm optiview port was placed through the supraumbilical region, and a 10 mm 0-degree operative laparoscope was introduced. The area underlying the trocar and Veress needle were inspected and without evidence of injury.  Remaining trocars were placed under direct vision. Two 5 mm ports were placed in the right abdomen, between the anterior axillary and midclavicular line.  A final 11 mm port was placed through the mid-epigastrium, near the falciform ligament.    The gallbladder fundus was elevated cephalad and the infundibulum was retracted to the patient's right. The gallbladder/cystic duct junction was skeletonized. The cystic artery noted in the triangle of Calot and was also skeletonized.  We then continued liberal medial and lateral dissection until the critical view of safety was achieved.    The cystic artery was doubly clipped and divided. The cystic duct as clipped  distal and a ductotomy was made. The cholangiogram clamp and catheter were threaded and cholangiogram was performed with dye. There was filling of the hepatic duct, the common duct with no obvious stones and filling into the duodenum. The cholangiogram catheter and clamp were removed. The proximal part of the duct as clipped X2 and divided the remainder of the duct. The gallbladder was then dissected from the liver bed with electrocautery. The specimen was placed in an Endopouch and was retrieved through the epigastric site.   Final inspection revealed acceptable hemostasis. Surgical SNOW was placed in the gallbladder bed.  Trocars were removed and pneumoperitoneum was released.  0 Vicryl fascial sutures were used to close the epigastric and umbilical port sites. Skin incisions were closed with 4-0 Monocryl subcuticular sutures and Dermabond. The patient was awakened from anesthesia and extubated without complication.    Algis Greenhouse, MD [facility] [address] [place] [place], [place] [address] [phone] (office)

## 2023-01-13 NOTE — Discharge Instructions (Addendum)
Discharge Laparoscopic Surgery Instructions:  Common Complaints: Right shoulder pain is common after laparoscopic surgery. This is secondary to the gas used in the surgery being trapped under the diaphragm.  Walk to help your body absorb the gas. This will improve in a few days. Pain at the port sites are common, especially the larger port sites. This will improve with time.  Some nausea is common and poor appetite. The main goal is to stay hydrated the first few days after surgery.   Diet/ Activity: Diet as tolerated. You may not have an appetite, but it is important to stay hydrated. Drink 64 ounces of water a day. Your appetite will return with time.  Shower per your regular routine daily.  Do not take hot showers. Take warm showers that are less than 10 minutes. Rest and listen to your body, but do not remain in bed all day.  Walk everyday for at least 15-20 minutes. Deep cough and move around every 1-2 hours in the first few days after surgery.  Do not lift > 10 lbs, perform excessive bending, pushing, pulling, squatting for 1-2 weeks after surgery.  Do not pick at the dermabond glue on your incision sites.  This glue film will remain in place for 1-2 weeks and will start to peel off.  Do not place lotions or balms on your incision unless instructed to specifically by Dr. Bridges.   Pain Expectations and Narcotics: -After surgery you will have pain associated with your incisions and this is normal. The pain is muscular and nerve pain, and will get better with time. -You are encouraged and expected to take non narcotic medications like tylenol and ibuprofen (when able) to treat pain as multiple modalities can aid with pain treatment. -Narcotics are only used when pain is severe or there is breakthrough pain. -You are not expected to have a pain score of 0 after surgery, as we cannot prevent pain. A pain score of 3-4 that allows you to be functional, move, walk, and tolerate some activity is  the goal. The pain will continue to improve over the days after surgery and is dependent on your surgery. -Due to [place] law, we are only able to give a certain amount of pain medication to treat post operative pain, and we only give additional narcotics on a patient by patient basis.  -For most laparoscopic surgery, studies have shown that the majority of patients only need 10-15 narcotic pills, and for open surgeries most patients only need 15-20.   -Having appropriate expectations of pain and knowledge of pain management with non narcotics is important as we do not want anyone to become addicted to narcotic pain medication.  -Using ice packs in the first 48 hours and heating pads after 48 hours, wearing an abdominal binder (when recommended), and using over the counter medications are all ways to help with pain management.   -Simple acts like meditation and mindfulness practices after surgery can also help with pain control and research has proven the benefit of these practices.  Medication: Take tylenol and ibuprofen as needed for pain control, alternating every 4-6 hours.  Example:  Tylenol 1000mg @ 6am, 12noon, 6pm, 12midnight (Do not exceed 4000mg of tylenol a day). Ibuprofen 800mg @ 9am, 3pm, 9pm, 3am (Do not exceed 3600mg of ibuprofen a day).  Take Roxicodone for breakthrough pain every 4 hours.  Take Colace for constipation related to narcotic pain medication. If you do not have a bowel movement in 2 days, take Miralax   over the counter.  Drink plenty of water to also prevent constipation.   Contact Information: If you have questions or concerns, please call our office, [phone], Monday- Thursday 8AM-5PM and Friday 8AM-12Noon.  If it is after hours or on the weekend, please call [facility] Main Number, [phone], [phone], and ask to speak to the surgeon on call for Dr. Bridges at .   

## 2023-01-13 NOTE — Anesthesia Postprocedure Evaluation (Signed)
Anesthesia Post Note  Patient: Anna [name]  Procedure(s) Performed: LAPAROSCOPIC CHOLECYSTECTOMY WITH INTRAOPERATIVE CHOLANGIOGRAM (Abdomen)  Patient location during evaluation: Phase II Anesthesia Type: General Level of consciousness: awake and alert and oriented Pain management: pain level controlled Vital Signs Assessment: post-procedure vital signs reviewed and stable Respiratory status: spontaneous breathing, nonlabored ventilation and respiratory function stable Cardiovascular status: blood pressure returned to baseline and stable Postop Assessment: no apparent nausea or vomiting Anesthetic complications: no  No notable events documented.   Last Vitals:  Vitals:   01/13/23 1330 01/13/23 1350  BP: 103/63 91/69  Pulse: 60 62  Resp: 14 16  Temp:  36.6 C  SpO2: 100% 100%    Last Pain:  Vitals:   01/13/23 1350  TempSrc:   PainSc: 4                  Falan Hensler C Kimberley Speece

## 2023-01-13 NOTE — Anesthesia Procedure Notes (Signed)
Procedure Name: Intubation Date/Time: 01/13/2023 11:58 AM  Performed by: Lorin Glass, CRNAPre-anesthesia Checklist: Patient identified, Emergency Drugs available, Suction available and Patient being monitored Patient Re-evaluated:Patient Re-evaluated prior to induction Oxygen Delivery Method: Circle system utilized Preoxygenation: Pre-oxygenation with 100% oxygen Induction Type: IV induction Ventilation: Mask ventilation without difficulty Laryngoscope Size: Mac and 3 Grade View: Grade I Tube type: Oral Tube size: 7.5 mm Number of attempts: 1 Airway Equipment and Method: Stylet Placement Confirmation: ETT inserted through vocal cords under direct vision, positive ETCO2 and breath sounds checked- equal and bilateral Secured at: 20 cm Tube secured with: Tape Dental Injury: Teeth and Oropharynx as per pre-operative assessment

## 2023-01-16 ENCOUNTER — Encounter (HOSPITAL_COMMUNITY): Payer: Self-pay | Admitting: General Surgery

## 2023-01-16 LAB — SURGICAL PATHOLOGY

## 2023-01-16 NOTE — Progress Notes (Signed)
noted 

## 2023-01-26 ENCOUNTER — Ambulatory Visit (INDEPENDENT_AMBULATORY_CARE_PROVIDER_SITE_OTHER): Payer: PRIVATE HEALTH INSURANCE | Admitting: General Surgery

## 2023-01-26 DIAGNOSIS — K8020 Calculus of gallbladder without cholecystitis without obstruction: Secondary | ICD-10-CM

## 2023-01-26 DIAGNOSIS — K838 Other specified diseases of biliary tract: Secondary | ICD-10-CM

## 2023-01-26 NOTE — Progress Notes (Signed)
[facility]  I am calling the patient for post operative evaluation. This is not a billable encounter as it is under the global charges for the surgery.  The patient had a laparoscopic cholecystectomy and IOC on 01/13/23. The patient reports that she is doing well. She feels like she is healing well. The are tolerating a diet, having good pain control, and having regular Bms that are looser.  The incisions are healing and the glue is peeling and she is trimming it off. The patient has a concern that her right upper back area has some pain/ near the scapula it sounds like that is hurting. She does not know if this is from the gallbladder or something else. I told her it could be referred pain but if it does not go away in a month after surgery it is unlikely related and to notify her PCP.  Cholangiogram final read with no stones or strictures.   Pathology: FINAL MICROSCOPIC DIAGNOSIS:   A. GALLBLADDER, CHOLECYSTECTOMY:  Chronic cholecystitis.  Cholelithiasis.   Will see the patient PRN.   Algis Greenhouse, MD [facility] [address] [place], [place] [address] [phone] (office)

## 2023-02-02 ENCOUNTER — Telehealth: Payer: Self-pay | Admitting: Family

## 2023-02-02 MED ORDER — LOSARTAN POTASSIUM 25 MG PO TABS: 25.0000 mg | ORAL_TABLET | ORAL | 3 refills | Status: DC

## 2023-02-02 NOTE — Telephone Encounter (Signed)
Prescription Request  02/02/2023  LOV: 09/26/2022  What is the name of the medication or equipment? losartan (COZAAR) 25 MG tablet   Have you contacted your pharmacy to request a refill? No   Which pharmacy would you like this sent to?  [facility] [address] [facility] [place], [place] - [address] AT [facility] [address] [place] [address] Phone: [phone] Fax: [phone]    Patient notified that their request is being sent to the clinical staff for review and that they should receive a response within 2 business days.   Please advise at Mobile [phone] (mobile)

## 2023-02-02 NOTE — Telephone Encounter (Signed)
Last OV: 09/26/2022 Pending OV: 03/29/2023 Medication Losartan 25mg  Directions: Take one tablet daily Last Refill: We do not have anything file from our office.

## 2023-02-17 ENCOUNTER — Ambulatory Visit: Payer: PRIVATE HEALTH INSURANCE

## 2023-02-20 ENCOUNTER — Ambulatory Visit
Admission: RE | Admit: 2023-02-20 | Discharge: 2023-02-20 | Disposition: A | Discharge status: Home / Self Care | Payer: PRIVATE HEALTH INSURANCE | Source: Ambulatory Visit | Attending: Family | Admitting: Family

## 2023-02-20 DIAGNOSIS — Z1231 Encounter for screening mammogram for malignant neoplasm of breast: Secondary | ICD-10-CM

## 2023-03-13 ENCOUNTER — Other Ambulatory Visit (HOSPITAL_COMMUNITY)
Admission: RE | Admit: 2023-03-13 | Discharge: 2023-03-13 | Disposition: A | Discharge status: Home / Self Care | Payer: PRIVATE HEALTH INSURANCE | Source: Ambulatory Visit | Attending: Obstetrics & Gynecology | Admitting: Obstetrics & Gynecology

## 2023-03-13 ENCOUNTER — Encounter: Payer: Self-pay | Admitting: Obstetrics & Gynecology

## 2023-03-13 ENCOUNTER — Ambulatory Visit (INDEPENDENT_AMBULATORY_CARE_PROVIDER_SITE_OTHER): Payer: PRIVATE HEALTH INSURANCE | Admitting: Obstetrics & Gynecology

## 2023-03-13 VITALS — BP 124/86 | HR 79 | Ht 63.0 in | Wt 134.0 lb

## 2023-03-13 DIAGNOSIS — Z1211 Encounter for screening for malignant neoplasm of colon: Secondary | ICD-10-CM | POA: Diagnosis not present

## 2023-03-13 DIAGNOSIS — Z01419 Encounter for gynecological examination (general) (routine) without abnormal findings: Secondary | ICD-10-CM | POA: Diagnosis not present

## 2023-03-13 DIAGNOSIS — Z1212 Encounter for screening for malignant neoplasm of rectum: Secondary | ICD-10-CM | POA: Diagnosis not present

## 2023-03-13 NOTE — Addendum Note (Signed)
Addended by: Annamarie Dawley on: 03/13/2023 03:49 PM   Modules accepted: Orders

## 2023-03-13 NOTE — Progress Notes (Signed)
Subjective:     Anna [name] is a 57 y.o. female here for a routine exam.  No LMP recorded. Patient has had an ablation. G2P0 Birth Control Method:  menopausal Menstrual Calendar(currently): amenorrhea  Current complaints: none.   Current acute medical issues:  none   Recent Gynecologic History No LMP recorded. Patient has had an ablation. Last Pap: 02/2022,  normal Last mammogram: 02/20/23,  normal  Past Medical History:  Diagnosis Date   Allergy    Anemia    Anxiety    Arthritis of low back    Bilateral low back pain with right-sided sciatica    Biliary colic    Coronary artery disease 04/04/2022   a.) cCTA 04/04/2022: Ca2+ 1487 (99th percentile age/sex/race matched control) --> ctFFR suggested no significant stenosis.   Depression    Diastolic dysfunction 05/17/2022   a.) TTE 05/17/2022: EF 60-65%, no RWMAs, GLS -20.9%, PASP 42.2, triv MR, G1DD   Family history of adverse reaction to anesthesia    a.) delayed emergence in second degree relative (maternal niece)   Heart murmur    History of tobacco use    Hyperlipidemia    Insomnia    a.) takes trazodone PRN   Palpitations    Primary hypertension 09/26/2022   PSVT (paroxysmal supraventricular tachycardia) (HCC)    Seizure disorder (HCC)    very rare and when it happens pt states she loses her vision short term-last episode around 2022   Statin intolerance    Tobacco abuse 07/18/2016    Past Surgical History:  Procedure Laterality Date   CATARACT EXTRACTION, BILATERAL     CHOLECYSTECTOMY N/A 01/13/2023   Procedure: LAPAROSCOPIC CHOLECYSTECTOMY WITH INTRAOPERATIVE CHOLANGIOGRAM;  Surgeon: [name], MD;  Location: [facility];  Service: General;  Laterality: N/A;   COLONOSCOPY  01/10/2012   Procedure: COLONOSCOPY;  Surgeon: Dalia Heading, MD;  Location: [facility];  Service: Gastroenterology;  Laterality: N/A;   CYST EXCISION Right    hand   DILATION AND CURETTAGE OF UTERUS     Ablation   FOOT SURGERY Left     HAND SURGERY     LUMBAR FUSION     TONSILLECTOMY      OB History     Gravida  2   Para      Term      Preterm      AB      Living  2      SAB      IAB      Ectopic      Multiple      Live Births              Social History   Socioeconomic History   Marital status: Divorced    Spouse name: Not on file   Number of children: 2   Years of education: 14   Highest education level: Not on file  Occupational History    Employer: [employer]  Tobacco Use   Smoking status: Every Day    Current packs/day: 0.50    Average packs/day: 0.5 packs/day for 38.8 years (19.4 ttl pk-yrs)    Types: Cigarettes    Start date: 05/09/1984   Smokeless tobacco: Never  Vaping Use   Vaping status: Never Used  Substance and Sexual Activity   Alcohol use: Not Currently   Drug use: No   Sexual activity: Not Currently    Partners: Male    Birth control/protection: Surgical  Comment: ablation, tubal  Other Topics Concern   Not on file  Social History Narrative   Lives alone   Two grown daughters   Significant friend - 1 1/2 years   Loves to walk   Social Determinants of Health   Financial Resource Strain: Low Risk  (03/13/2023)   Overall Financial Resource Strain (CARDIA)    Difficulty of Paying Living Expenses: Not hard at all  Food Insecurity: No Food Insecurity (03/13/2023)   Hunger Vital Sign    Worried About Running Out of Food in the Last Year: Never true    Ran Out of Food in the Last Year: Never true  Transportation Needs: No Transportation Needs (03/13/2023)   PRAPARE - Administrator, Civil Service (Medical): No    Lack of Transportation (Non-Medical): No  Physical Activity: Sufficiently Active (03/13/2023)   Exercise Vital Sign    Days of Exercise per Week: 6 days    Minutes of Exercise per Session: 100 min  Stress: No Stress Concern Present (03/13/2023)   [employer] of Occupational Health - Occupational Stress  Questionnaire    Feeling of Stress : Not at all  Social Connections: Moderately Isolated (03/13/2023)   Social Connection and Isolation Panel [NHANES]    Frequency of Communication with Friends and Family: More than three times a week    Frequency of Social Gatherings with Friends and Family: Once a week    Attends Religious Services: Never    Database administrator or Organizations: No    Attends Engineer, structural: Never    Marital Status: Living with partner    Family History  Problem Relation Age of Onset   Hypertension Mother    Diabetes Mother    Heart attack Mother 47   Heart disease Mother    Kidney disease Mother    Hyperlipidemia Mother    Non-Hodgkin's lymphoma Father        t cell   Arthritis Father    Hyperlipidemia Father    Thyroid disease Daughter    Breast cancer Cousin        in 51's   Breast cancer Cousin        in 52's     Current Outpatient Medications:    aspirin EC 81 MG tablet, Take 1 tablet (81 mg total) by mouth daily. Swallow whole., Disp: 90 tablet, Rfl: 3   cholecalciferol (VITAMIN D3) 25 MCG (1000 UNIT) tablet, Take 2,000 Units by mouth daily., Disp: , Rfl:    ezetimibe (ZETIA) 10 MG tablet, Take 1 tablet (10 mg total) by mouth daily. (Patient taking differently: Take 10 mg by mouth every evening.), Disp: 90 tablet, Rfl: 3   fenofibrate 160 MG tablet, Take 160 mg by mouth every evening., Disp: , Rfl:    loratadine (CLARITIN) 10 MG tablet, Take 10 mg by mouth daily as needed for allergies., Disp: , Rfl:    losartan (COZAAR) 25 MG tablet, Take 1 tablet (25 mg total) by mouth every morning., Disp: 90 tablet, Rfl: 3   traZODone (DESYREL) 100 MG tablet, Take 1 tablet (100 mg total) by mouth at bedtime., Disp: 90 tablet, Rfl: 3   Turmeric (QC TUMERIC COMPLEX PO), Take 1 tablet by mouth daily., Disp: , Rfl:    citalopram (CELEXA) 10 MG tablet, Take 1 tablet (10 mg total) by mouth daily. (Patient not taking: Reported on 03/13/2023), Disp: 90  tablet, Rfl: 0   diclofenac Sodium (VOLTAREN) 1 % GEL, Apply 2 g  topically daily as needed (pain). (Patient not taking: Reported on 03/13/2023), Disp: , Rfl:    Multiple Vitamins-Minerals (OCUVITE PRESERVISION PO), Take 1 tablet by mouth in the morning and at bedtime. (Patient not taking: Reported on 03/13/2023), Disp: , Rfl:    Omega-3 Fatty Acids (FISH OIL PO), Take 1 capsule by mouth daily. (Patient not taking: Reported on 03/13/2023), Disp: , Rfl:   Review of Systems  Review of Systems  Constitutional: Negative for fever, chills, weight loss, malaise/fatigue and diaphoresis.  HENT: Negative for hearing loss, ear pain, nosebleeds, congestion, sore throat, neck pain, tinnitus and ear discharge.   Eyes: Negative for blurred vision, double vision, photophobia, pain, discharge and redness.  Respiratory: Negative for cough, hemoptysis, sputum production, shortness of breath, wheezing and stridor.   Cardiovascular: Negative for chest pain, palpitations, orthopnea, claudication, leg swelling and PND.  Gastrointestinal: negative for abdominal pain. Negative for heartburn, nausea, vomiting, diarrhea, constipation, blood in stool and melena.  Genitourinary: Negative for dysuria, urgency, frequency, hematuria and flank pain.  Musculoskeletal: Negative for myalgias, back pain, joint pain and falls.  Skin: Negative for itching and rash.  Neurological: Negative for dizziness, tingling, tremors, sensory change, speech change, focal weakness, seizures, loss of consciousness, weakness and headaches.  Endo/Heme/Allergies: Negative for environmental allergies and polydipsia. Does not bruise/bleed easily.  Psychiatric/Behavioral: Negative for depression, suicidal ideas, hallucinations, memory loss and substance abuse. The patient is not nervous/anxious and does not have insomnia.        Objective:  Blood pressure 124/86, pulse 79, height 5\' 3"  (1.6 m), weight 134 lb (60.8 kg).   Physical Exam  Vitals  reviewed. Constitutional: She is oriented to person, place, and time. She appears well-developed and well-nourished.  HENT:  Head: Normocephalic and atraumatic.        Right Ear: External ear normal.  Left Ear: External ear normal.  Nose: Nose normal.  Mouth/Throat: Oropharynx is clear and moist.  Eyes: Conjunctivae and EOM are normal. Pupils are equal, round, and reactive to light. Right eye exhibits no discharge. Left eye exhibits no discharge. No scleral icterus.  Neck: Normal range of motion. Neck supple. No tracheal deviation present. No thyromegaly present.  Cardiovascular: Normal rate, regular rhythm, normal heart sounds and intact distal pulses.  Exam reveals no gallop and no friction rub.   No murmur heard. Respiratory: Effort normal and breath sounds normal. No respiratory distress. She has no wheezes. She has no rales. She exhibits no tenderness.  GI: Soft. Bowel sounds are normal. She exhibits no distension and no mass. There is no tenderness. There is no rebound and no guarding.  Genitourinary:  Breasts no masses skin changes or nipple changes bilaterally      Vulva is normal without lesions Vagina is pink moist without discharge Cervix normal in appearance and pap is done Uterus is normal size shape and contour Adnexa is negative with normal sized ovaries  {Rectal    hemoccult negative, normal tone, no masses  Musculoskeletal: Normal range of motion. She exhibits no edema and no tenderness.  Neurological: She is alert and oriented to person, place, and time. She has normal reflexes. She displays normal reflexes. No cranial nerve deficit. She exhibits normal muscle tone. Coordination normal.  Skin: Skin is warm and dry. No rash noted. No erythema. No pallor.  Psychiatric: She has a normal mood and affect. Her behavior is normal. Judgment and thought content normal.       Medications Ordered at today's visit: No orders of the defined  types were placed in this  encounter.   Other orders placed at today's visit: No orders of the defined types were placed in this encounter.     Assessment:    Normal Gyn exam.    Plan:    Follow up in: 3 years.     No follow-ups on file.

## 2023-03-21 LAB — CYTOLOGY - PAP
Comment: NEGATIVE
Diagnosis: NEGATIVE
High risk HPV: NEGATIVE

## 2023-03-29 ENCOUNTER — Ambulatory Visit: Payer: PRIVATE HEALTH INSURANCE | Admitting: Family

## 2023-03-29 ENCOUNTER — Encounter: Payer: Self-pay | Admitting: Family

## 2023-03-29 VITALS — BP 126/68 | HR 75 | Temp 97.7°F | Ht 63.0 in | Wt 133.2 lb

## 2023-03-29 DIAGNOSIS — E782 Mixed hyperlipidemia: Secondary | ICD-10-CM

## 2023-03-29 DIAGNOSIS — M62838 Other muscle spasm: Secondary | ICD-10-CM | POA: Diagnosis not present

## 2023-03-29 DIAGNOSIS — R002 Palpitations: Secondary | ICD-10-CM

## 2023-03-29 DIAGNOSIS — E559 Vitamin D deficiency, unspecified: Secondary | ICD-10-CM

## 2023-03-29 DIAGNOSIS — Z23 Encounter for immunization: Secondary | ICD-10-CM | POA: Diagnosis not present

## 2023-03-29 DIAGNOSIS — M792 Neuralgia and neuritis, unspecified: Secondary | ICD-10-CM | POA: Diagnosis not present

## 2023-03-29 DIAGNOSIS — I10 Essential (primary) hypertension: Secondary | ICD-10-CM

## 2023-03-29 LAB — BASIC METABOLIC PANEL
BUN: 22 mg/dL (ref 6–23)
CO2: 28 meq/L (ref 19–32)
Calcium: 9.8 mg/dL (ref 8.4–10.5)
Chloride: 107 meq/L (ref 96–112)
Creatinine, Ser: 0.7 mg/dL (ref 0.40–1.20)
GFR: 95.16 mL/min (ref >60.00)
Glucose, Bld: 90 mg/dL (ref 70–99)
Potassium: 4 meq/L (ref 3.5–5.1)
Sodium: 141 meq/L (ref 135–145)

## 2023-03-29 LAB — LIPID PANEL
Cholesterol: 137 mg/dL (ref 0–200)
HDL: 57 mg/dL (ref >39.00)
LDL Cholesterol: 69 mg/dL (ref 0–99)
NonHDL: 79.99
Total CHOL/HDL Ratio: 2
Triglycerides: 55 mg/dL (ref 0.0–149.0)
VLDL: 11 mg/dL (ref 0.0–40.0)

## 2023-03-29 LAB — CBC
HCT: 34.4 % — ABNORMAL LOW (ref 36.0–46.0)
Hemoglobin: 11.7 g/dL — ABNORMAL LOW (ref 12.0–15.0)
MCHC: 34.1 g/dL (ref 30.0–36.0)
MCV: 90.9 fL (ref 78.0–100.0)
Platelets: 309 10*3/uL (ref 150.0–400.0)
RBC: 3.78 Mil/uL — ABNORMAL LOW (ref 3.87–5.11)
RDW: 12.7 % (ref 11.5–15.5)
WBC: 5.3 10*3/uL (ref 4.0–10.5)

## 2023-03-29 LAB — MAGNESIUM: Magnesium: 1.8 mg/dL (ref 1.5–2.5)

## 2023-03-29 LAB — VITAMIN D 25 HYDROXY (VIT D DEFICIENCY, FRACTURES): VITD: 58.31 ng/mL (ref 30.00–100.00)

## 2023-03-29 LAB — VITAMIN B12: Vitamin B-12: 271 pg/mL (ref 211–911)

## 2023-03-29 NOTE — Progress Notes (Unsigned)
   Established Patient Office Visit  Subjective:      CC:  Chief Complaint  Patient presents with  . Medical Management of Chronic Issues    HPI: Anna [name] is a 57 y.o. female presenting on 03/29/2023 for Medical Management of Chronic Issues . Cholecystectomy 01/13/23, has felt much better since having gallbladder removed.   Anemia, slight from post op however normal.   HTN: on asa 81 mg, losartan 25 mg   HLD: taking zetia and the fenofibrate 160 mg once daily.   Heart murmur: states was auscultated during her last cardiology visit and she was advised it was called trivial.       Social history:  Relevant past medical, surgical, family and social history reviewed and updated as indicated. Interim medical history since our last visit reviewed.  Allergies and medications reviewed and updated.  DATA REVIEWED: CHART IN EPIC     ROS: Negative unless specifically indicated above in HPI.    Current Outpatient Medications:  .  aspirin EC 81 MG tablet, Take 1 tablet (81 mg total) by mouth daily. Swallow whole., Disp: 90 tablet, Rfl: 3 .  cholecalciferol (VITAMIN D3) 25 MCG (1000 UNIT) tablet, Take 2,000 Units by mouth daily., Disp: , Rfl:  .  ezetimibe (ZETIA) 10 MG tablet, Take 1 tablet (10 mg total) by mouth daily., Disp: 90 tablet, Rfl: 3 .  fenofibrate 160 MG tablet, Take 160 mg by mouth every evening., Disp: , Rfl:  .  loratadine (CLARITIN) 10 MG tablet, Take 10 mg by mouth daily as needed for allergies., Disp: , Rfl:  .  losartan (COZAAR) 25 MG tablet, Take 1 tablet (25 mg total) by mouth every morning., Disp: 90 tablet, Rfl: 3 .  Multiple Vitamins-Minerals (OCUVITE PRESERVISION PO), Take 1 tablet by mouth in the morning and at bedtime., Disp: , Rfl:  .  Omega-3 Fatty Acids (FISH OIL PO), Take 1 capsule by mouth daily., Disp: , Rfl:  .  traZODone (DESYREL) 100 MG tablet, Take 1 tablet (100 mg total) by mouth at bedtime., Disp: 90 tablet, Rfl: 3 .  Turmeric (QC  TUMERIC COMPLEX PO), Take 1 tablet by mouth daily., Disp: , Rfl:       Objective:    BP 126/68 (BP Location: Left Arm, Patient Position: Sitting, Cuff Size: Normal)   Pulse 75   Temp 97.7 F (36.5 C) (Temporal)   Ht 5\' 3"  (1.6 m)   Wt 133 lb 3.2 oz (60.4 kg)   SpO2 96%   BMI 23.60 kg/m   Wt Readings from Last 3 Encounters:  03/29/23 133 lb 3.2 oz (60.4 kg)  03/13/23 134 lb (60.8 kg)  01/13/23 130 lb 1.1 oz (59 kg)    Physical Exam        Assessment & Plan:  Need for influenza vaccination -     Flu vaccine trivalent PF, 6mos and older(Flulaval,Afluria,Fluarix,Fluzone)  Muscle spasm -     Magnesium -     Basic metabolic panel  Primary hypertension  Palpitations -     CBC  Mixed hyperlipidemia -     Lipid panel -     Lipoprotein A (LPA)  Vitamin D deficiency -     VITAMIN D 25 Hydroxy (Vit-D Deficiency, Fractures)  Neuralgia -     Vitamin B12     Return in about 6 months (around 09/26/2023) for f/u cholesterol.  Mort Sawyers, MSN, APRN, FNP-C  [facility]

## 2023-03-30 DIAGNOSIS — M62838 Other muscle spasm: Secondary | ICD-10-CM | POA: Insufficient documentation

## 2023-03-30 NOTE — Assessment & Plan Note (Signed)
Stable  Continue losartan 25 mg once daily

## 2023-03-30 NOTE — Assessment & Plan Note (Signed)
Will check mag and potassium pending results.  Advised pt to focus on adequate water hydration goal of 6-8 glasses daily, 90 oz

## 2023-03-31 LAB — LIPOPROTEIN A (LPA): Lipoprotein (a): 17 nmol/L (ref <75)

## 2023-05-09 ENCOUNTER — Ambulatory Visit
Admission: EM | Admit: 2023-05-09 | Discharge: 2023-05-09 | Disposition: A | Discharge status: Home / Self Care | Payer: PRIVATE HEALTH INSURANCE | Attending: Emergency Medicine | Admitting: Emergency Medicine

## 2023-05-09 DIAGNOSIS — J069 Acute upper respiratory infection, unspecified: Secondary | ICD-10-CM | POA: Diagnosis not present

## 2023-05-09 LAB — POCT RAPID STREP A (OFFICE): Rapid Strep A Screen: NEGATIVE

## 2023-05-09 LAB — POC COVID19/FLU A&B COMBO
Covid Antigen, POC: NEGATIVE
Influenza A Antigen, POC: NEGATIVE
Influenza B Antigen, POC: NEGATIVE

## 2023-05-09 NOTE — ED Triage Notes (Signed)
Patient to Urgent Care with complaints of sinus pain/ sore throat/ sneezing/ low grade fevers.  Symptoms started yesterday.

## 2023-05-09 NOTE — ED Provider Notes (Signed)
 Anna [name]    CSN: [id number] Arrival date & time: 05/09/23  1550      History   Chief Complaint Chief Complaint  Patient presents with   URI    HPI Anna [name] is a 57 y.o. female.   Presents for evaluation of low-grade fevers, nasal congestion, rhinorrhea, sore throat, sneezing, bilateral ear fullness and mild intermittent generalized headaches present beginning 1 day ago.  Known sick contacts prior.  Poor appetite but able to tolerate some food and liquids.  Has attempted use of Mucinex DM and chewing gum to help keep the throat from becoming dry.  Blood pressure 123/80, temperature 98.3, O2 saturation 100% on room air, pulse 81, respirations 19   Past Medical History:  Diagnosis Date   Allergy    Anemia    Anxiety    Arthritis of low back    Bilateral low back pain with right-sided sciatica    Biliary colic    Coronary artery disease 04/04/2022   a.) cCTA 04/04/2022: Ca2+ 1487 (99th percentile age/sex/race matched control) --> ctFFR suggested no significant stenosis.   Depression    Diastolic dysfunction 05/17/2022   a.) TTE 05/17/2022: EF 60-65%, no RWMAs, GLS -20.9%, PASP 42.2, triv MR, G1DD   Family history of adverse reaction to anesthesia    a.) delayed emergence in second degree relative (maternal niece)   Heart murmur    History of tobacco use    Hyperlipidemia    Insomnia    a.) takes trazodone  PRN   Palpitations    Primary hypertension 09/26/2022   PSVT (paroxysmal supraventricular tachycardia) (HCC)    Seizure disorder (HCC)    very rare and when it happens pt states she loses her vision short term-last episode around 2022   Statin intolerance    Tobacco abuse 07/18/2016    Patient Active Problem List   Diagnosis Date Noted   Muscle spasm 03/30/2023   Dilated cbd, acquired 01/04/2023   Calculus of gallbladder without cholecystitis without obstruction 01/03/2023   Myalgia due to statin 09/26/2022   Statin intolerance 09/26/2022    History of tobacco use 09/26/2022   Primary hypertension 09/26/2022   Palpitations 09/26/2022   Coronary artery disease without angina pectoris 09/26/2022   Sleep disorder 09/26/2022   Mixed hyperlipidemia 09/26/2022   GAD (generalized anxiety disorder) 07/18/2016   Depression 04/05/2014   Bilateral low back pain with right-sided sciatica 04/05/2014    Past Surgical History:  Procedure Laterality Date   CATARACT EXTRACTION, BILATERAL     CHOLECYSTECTOMY N/A 01/13/2023   Procedure: LAPAROSCOPIC CHOLECYSTECTOMY WITH INTRAOPERATIVE CHOLANGIOGRAM;  Surgeon: Kallie Manuelita BROCKS, MD;  Location: [facility];  Service: General;  Laterality: N/A;   COLONOSCOPY  01/10/2012   Procedure: COLONOSCOPY;  Surgeon: Oneil DELENA Budge, MD;  Location: [facility];  Service: Gastroenterology;  Laterality: N/A;   CYST EXCISION Right    hand   DILATION AND CURETTAGE OF UTERUS     Ablation   FOOT SURGERY Left    HAND SURGERY     LUMBAR FUSION     TONSILLECTOMY      OB History     Gravida  2   Para      Term      Preterm      AB      Living  2      SAB      IAB      Ectopic      Multiple  Live Births               Home Medications    Prior to Admission medications   Medication Sig Start Date End Date Taking? Authorizing Provider  aspirin  EC 81 MG tablet Take 1 tablet (81 mg total) by mouth daily. Swallow whole. 04/05/22   Darliss Rogue, MD  cholecalciferol (VITAMIN D3) 25 MCG (1000 UNIT) tablet Take 2,000 Units by mouth daily.    [provider]  ezetimibe  (ZETIA ) 10 MG tablet Take 1 tablet (10 mg total) by mouth daily. 08/18/22 04/04/24  Carlin Delon BROCKS, NP  fenofibrate  160 MG tablet Take 160 mg by mouth every evening.    [provider]  loratadine (CLARITIN) 10 MG tablet Take 10 mg by mouth daily as needed for allergies.    [provider]  losartan  (COZAAR ) 25 MG tablet Take 1 tablet (25 mg total) by mouth every morning. 02/02/23   Dugal,  Tabitha, FNP  Multiple Vitamins-Minerals (OCUVITE PRESERVISION PO) Take 1 tablet by mouth in the morning and at bedtime.    [provider]  Omega-3 Fatty Acids (FISH OIL PO) Take 1 capsule by mouth daily.    [provider]  traZODone  (DESYREL ) 100 MG tablet Take 1 tablet (100 mg total) by mouth at bedtime. 09/26/22   Corwin Antu, FNP  Turmeric (QC TUMERIC COMPLEX PO) Take 1 tablet by mouth daily.    [provider]    Family History Family History  Problem Relation Age of Onset   Hypertension Mother    Diabetes Mother    Heart attack Mother 34   Heart disease Mother    Kidney disease Mother    Hyperlipidemia Mother    Non-Hodgkin's lymphoma Father        t cell   Arthritis Father    Hyperlipidemia Father    Thyroid  disease Daughter    Breast cancer Cousin        in 23's   Breast cancer Cousin        in 66's    Social History Social History   Tobacco Use   Smoking status: Every Day    Current packs/day: 0.50    Average packs/day: 0.5 packs/day for 39.0 years (19.5 ttl pk-yrs)    Types: Cigarettes    Start date: 05/09/1984   Smokeless tobacco: Never  Vaping Use   Vaping status: Never Used  Substance Use Topics   Alcohol use: Not Currently   Drug use: No     Allergies   Buspirone, Lexapro  [escitalopram ], Penicillins, Statins, and Wellbutrin [bupropion]   Review of Systems Review of Systems   Physical Exam Triage Vital Signs ED Triage Vitals  Encounter Vitals Group     BP      Systolic BP Percentile      Diastolic BP Percentile      Pulse      Resp      Temp      Temp src      SpO2      Weight      Height      Head Circumference      Peak Flow      Pain Score      Pain Loc      Pain Education      Exclude from Growth Chart    No data found.  Updated Vital Signs There were no vitals taken for this visit.  Visual Acuity Right Eye Distance:   Left Eye  Distance:   Bilateral Distance:    Right Eye Near:   Left  Eye Near:    Bilateral Near:     Physical Exam Constitutional:      Appearance: Normal appearance.  HENT:     Head: Normocephalic.     Right Ear: Tympanic membrane, ear canal and external ear normal.     Left Ear: Tympanic membrane, ear canal and external ear normal.     Nose: Congestion present. No rhinorrhea.     Mouth/Throat:     Mouth: Mucous membranes are moist.     Pharynx: Oropharynx is clear. No oropharyngeal exudate or posterior oropharyngeal erythema.  Eyes:     Extraocular Movements: Extraocular movements intact.  Cardiovascular:     Rate and Rhythm: Normal rate and regular rhythm.     Pulses: Normal pulses.     Heart sounds: Normal heart sounds.  Pulmonary:     Effort: Pulmonary effort is normal.     Breath sounds: Normal breath sounds.  Musculoskeletal:     Cervical back: Normal range of motion and neck supple.  Neurological:     Mental Status: She is alert and oriented to person, place, and time. Mental status is at baseline.      UC Treatments / Results  Labs (all labs ordered are listed, but only abnormal results are displayed) Labs Reviewed  POCT RAPID STREP A (OFFICE)  POC COVID19/FLU A&B COMBO    EKG   Radiology No results found.  Procedures Procedures (including critical care time)  Medications Ordered in UC Medications - No data to display  Initial Impression / Assessment and Plan / UC Course  I have reviewed the triage vital signs and the nursing notes.  Pertinent labs & imaging results that were available during my care of the patient were reviewed by me and considered in my medical decision making (see chart for details).  Viral URI with cough  Patient is in no signs of distress nor toxic appearing.  Vital signs are stable.  Low suspicion for pneumonia, pneumothorax or bronchitis and therefore will defer imaging.  COVID, flu and strep testing negative.May use additional over-the-counter medications as needed for supportive care.  May  follow-up with urgent care as needed if symptoms persist or worsen.  Final Clinical Impressions(s) / UC Diagnoses   Final diagnoses:  Viral URI with cough     Discharge Instructions      Your symptoms today are most likely being caused by a virus and should steadily improve in time it can take up to 7 to 10 days before you truly start to see a turnaround however things will get better  Covid flu and strep test negative    You can take Tylenol  and/or Ibuprofen as needed for fever reduction and pain relief.   For cough: honey 1/2 to 1 teaspoon (you can dilute the honey in water  or another fluid).  You can also use guaifenesin and dextromethorphan for cough. You can use a humidifier for chest congestion and cough.  If you don't have a humidifier, you can sit in the bathroom with the hot shower running.      For sore throat: try warm salt water  gargles, cepacol lozenges, throat spray, warm tea or water  with lemon/honey, popsicles or ice, or OTC cold relief medicine for throat discomfort.   For congestion: take a daily anti-histamine like Zyrtec, Claritin, and a oral decongestant, such as pseudoephedrine.  You can also use Flonase 1-2 sprays in each nostril daily.  It is important to stay hydrated: drink plenty of fluids (water , gatorade/powerade/pedialyte, juices, or teas) to keep your throat moisturized and help further relieve irritation/discomfort.    ED Prescriptions   None    PDMP not reviewed this encounter.   Teresa Shelba SAUNDERS, NP 05/09/23 [phone]

## 2023-05-09 NOTE — Discharge Instructions (Signed)
 Your symptoms today are most likely being caused by a virus and should steadily improve in time it can take up to 7 to 10 days before you truly start to see a turnaround however things will get better  Covid flu and strep test negative    You can take Tylenol  and/or Ibuprofen as needed for fever reduction and pain relief.   For cough: honey 1/2 to 1 teaspoon (you can dilute the honey in water  or another fluid).  You can also use guaifenesin and dextromethorphan for cough. You can use a humidifier for chest congestion and cough.  If you don't have a humidifier, you can sit in the bathroom with the hot shower running.      For sore throat: try warm salt water  gargles, cepacol lozenges, throat spray, warm tea or water  with lemon/honey, popsicles or ice, or OTC cold relief medicine for throat discomfort.   For congestion: take a daily anti-histamine like Zyrtec, Claritin, and a oral decongestant, such as pseudoephedrine.  You can also use Flonase 1-2 sprays in each nostril daily.   It is important to stay hydrated: drink plenty of fluids (water , gatorade/powerade/pedialyte, juices, or teas) to keep your throat moisturized and help further relieve irritation/discomfort.

## 2023-05-11 ENCOUNTER — Encounter: Payer: Self-pay | Admitting: Family Medicine

## 2023-05-11 ENCOUNTER — Ambulatory Visit: Payer: PRIVATE HEALTH INSURANCE | Admitting: Family Medicine

## 2023-05-11 VITALS — BP 126/84 | HR 105 | Temp 98.1°F | Ht 63.0 in | Wt 134.0 lb

## 2023-05-11 DIAGNOSIS — R051 Acute cough: Secondary | ICD-10-CM | POA: Insufficient documentation

## 2023-05-11 LAB — POC COVID19 BINAXNOW: SARS Coronavirus 2 Ag: NEGATIVE

## 2023-05-11 LAB — POCT INFLUENZA A/B
Influenza A, POC: NEGATIVE
Influenza B, POC: NEGATIVE

## 2023-05-11 MED ORDER — AZITHROMYCIN 250 MG PO TABS: ORAL_TABLET | ORAL | 0 refills | Status: DC

## 2023-05-11 NOTE — Progress Notes (Signed)
 Patient ID: Anna [name], female    DOB: 05/29/1965, 58 y.o.   MRN: [id number]  This visit was conducted in person.  BP 126/84   Pulse (!) 105   Temp 98.1 F (36.7 C) (Temporal)   Ht 5' 3 (1.6 m)   Wt 134 lb (60.8 kg)   SpO2 98%   BMI 23.74 kg/m    CC:  Chief Complaint  Patient presents with   Cough    Symptoms started Monday, Tuesday she went to UC tested neg for flu, covid, strep.  Started with nasal congestion, sneezing progressed into runny nose and throat drainage, chest congestion and now coughing up green mucus     Subjective:   HPI: Anna [name] is a 58 y.o. female presenting on 05/11/2023 for Cough (Symptoms started Monday, Tuesday she went to UC tested neg for flu, covid, strep. /Started with nasal congestion, sneezing progressed into runny nose and throat drainage, chest congestion and now coughing up green mucus )   Date of onset: 4 days ago Initial symptoms included nasal congestion and sneezing Symptoms progressed to runny nose, postnasal drip, chest congestion Now with productive cough purulent mucus  Low grade fever initially  No SOB, no wheeze.  No ear pain, no face pain... feel like in a drum.  No body aches.  Cough not keeping her up at night      Sick contacts:  grandchildren with type A flu.... saw the 1 week after they  were feeling bad COVID testing:   Negative testing for flu, COVID and strep     She has tried to treat with  Mucinex D  Humidifier     No history of chronic lung disease such as asthma or COPD.  Current smoker.       Relevant past medical, surgical, family and social history reviewed and updated as indicated. Interim medical history since our last visit reviewed. Allergies and medications reviewed and updated. Outpatient Medications Prior to Visit  Medication Sig Dispense Refill   aspirin  EC 81 MG tablet Take 1 tablet (81 mg total) by mouth daily. Swallow whole. 90 tablet 3   cholecalciferol (VITAMIN D3) 25 MCG (1000  UNIT) tablet Take 2,000 Units by mouth daily.     ezetimibe  (ZETIA ) 10 MG tablet Take 1 tablet (10 mg total) by mouth daily. 90 tablet 3   fenofibrate  160 MG tablet Take 160 mg by mouth every evening.     loratadine (CLARITIN) 10 MG tablet Take 10 mg by mouth daily as needed for allergies.     losartan  (COZAAR ) 25 MG tablet Take 1 tablet (25 mg total) by mouth every morning. 90 tablet 3   Multiple Vitamins-Minerals (OCUVITE PRESERVISION PO) Take 1 tablet by mouth in the morning and at bedtime.     Omega-3 Fatty Acids (FISH OIL PO) Take 1 capsule by mouth daily.     traZODone  (DESYREL ) 100 MG tablet Take 1 tablet (100 mg total) by mouth at bedtime. 90 tablet 3   Turmeric (QC TUMERIC COMPLEX PO) Take 1 tablet by mouth daily.     No facility-administered medications prior to visit.     Per HPI unless specifically indicated in ROS section below Review of Systems  Constitutional:  Negative for fatigue and fever.  HENT:  Positive for congestion.   Eyes:  Negative for pain.  Respiratory:  Positive for cough. Negative for shortness of breath.   Cardiovascular:  Negative for chest pain, palpitations and leg swelling.  Gastrointestinal:  Negative for abdominal pain.  Genitourinary:  Negative for dysuria and vaginal bleeding.  Musculoskeletal:  Negative for back pain.  Neurological:  Negative for syncope, light-headedness and headaches.  Psychiatric/Behavioral:  Negative for dysphoric mood.    Objective:  BP 126/84   Pulse (!) 105   Temp 98.1 F (36.7 C) (Temporal)   Ht 5' 3 (1.6 m)   Wt 134 lb (60.8 kg)   SpO2 98%   BMI 23.74 kg/m   Wt Readings from Last 3 Encounters:  05/11/23 134 lb (60.8 kg)  03/29/23 133 lb 3.2 oz (60.4 kg)  03/13/23 134 lb (60.8 kg)      Physical Exam Constitutional:      General: She is not in acute distress.    Appearance: Normal appearance. She is well-developed. She is not ill-appearing or toxic-appearing.  HENT:     Head: Normocephalic.     Right Ear:  Hearing, tympanic membrane, ear canal and external ear normal. Tympanic membrane is not erythematous, retracted or bulging.     Left Ear: Hearing, tympanic membrane, ear canal and external ear normal. Tympanic membrane is not erythematous, retracted or bulging.     Nose: Congestion present. No mucosal edema or rhinorrhea.     Right Sinus: No maxillary sinus tenderness or frontal sinus tenderness.     Left Sinus: No maxillary sinus tenderness or frontal sinus tenderness.     Mouth/Throat:     Pharynx: Uvula midline.  Eyes:     General: Lids are normal. Lids are everted, no foreign bodies appreciated.     Conjunctiva/sclera: Conjunctivae normal.     Pupils: Pupils are equal, round, and reactive to light.  Neck:     Thyroid : No thyroid  mass or thyromegaly.     Vascular: No carotid bruit.     Trachea: Trachea normal.  Cardiovascular:     Rate and Rhythm: Normal rate and regular rhythm.     Pulses: Normal pulses.     Heart sounds: Normal heart sounds, S1 normal and S2 normal. No murmur heard.    No friction rub. No gallop.  Pulmonary:     Effort: Pulmonary effort is normal. No tachypnea or respiratory distress.     Breath sounds: Normal breath sounds. No decreased breath sounds, wheezing, rhonchi or rales.  Abdominal:     General: Bowel sounds are normal.     Palpations: Abdomen is soft.     Tenderness: There is no abdominal tenderness.  Musculoskeletal:     Cervical back: Normal range of motion and neck supple.  Skin:    General: Skin is warm and dry.     Findings: No rash.  Neurological:     Mental Status: She is alert.  Psychiatric:        Mood and Affect: Mood is not anxious or depressed.        Speech: Speech normal.        Behavior: Behavior normal. Behavior is cooperative.        Thought Content: Thought content normal.        Judgment: Judgment normal.       Results for orders placed or performed during the hospital encounter of 05/09/23  POCT rapid strep A    Collection Time: 05/09/23  7:32 PM  Result Value Ref Range   Rapid Strep A Screen Negative   POC Covid19/Flu A&B Antigen   Collection Time: 05/09/23  7:32 PM  Result Value Ref Range   Covid Antigen, POC Negative  Influenza B Antigen, POC Negative    Influenza A Antigen, POC Negative     Assessment and Plan  Acute cough Assessment & Plan: Acute, repeat COVID and flu test day 4 of illness negative. Symptoms most likely viral upper respiratory tract infection.  Recommended rest, fluids and supportive care. If symptoms not improving by day 7-10 of illness, she will complete a course of antibiotics given her history of frequent bacterial bronchitis.  Return and ER precautions provided. If severe shortness of breath go to the emergency room.  Orders: -     POC COVID-19 BinaxNow -     POCT Influenza A/B  Other orders -     Azithromycin ; 2 tab po x 1 day then 1 tab po daily  Dispense: 6 tablet; Refill: 0    No follow-ups on file.   Greig Ring, MD

## 2023-05-11 NOTE — Assessment & Plan Note (Signed)
 Acute, repeat COVID and flu test day 4 of illness negative. Symptoms most likely viral upper respiratory tract infection.  Recommended rest, fluids and supportive care. If symptoms not improving by day 7-10 of illness, she will complete a course of antibiotics given her history of frequent bacterial bronchitis.  Return and ER precautions provided. If severe shortness of breath go to the emergency room.

## 2023-06-05 ENCOUNTER — Encounter: Payer: Self-pay | Admitting: Obstetrics & Gynecology

## 2023-06-05 MED ORDER — NITROFURANTOIN MONOHYD MACRO 100 MG PO CAPS: 100.0000 mg | ORAL_CAPSULE | Freq: Two times a day (BID) | ORAL | 0 refills | Status: DC

## 2023-06-08 ENCOUNTER — Telehealth: Payer: Self-pay

## 2023-06-08 ENCOUNTER — Ambulatory Visit: Payer: PRIVATE HEALTH INSURANCE | Admitting: Family

## 2023-06-08 VITALS — BP 120/64 | HR 80 | Temp 98.6°F | Ht 63.0 in

## 2023-06-08 DIAGNOSIS — R197 Diarrhea, unspecified: Secondary | ICD-10-CM | POA: Diagnosis not present

## 2023-06-08 DIAGNOSIS — R300 Dysuria: Secondary | ICD-10-CM | POA: Diagnosis not present

## 2023-06-08 DIAGNOSIS — E782 Mixed hyperlipidemia: Secondary | ICD-10-CM

## 2023-06-08 DIAGNOSIS — R110 Nausea: Secondary | ICD-10-CM | POA: Diagnosis not present

## 2023-06-08 DIAGNOSIS — N3001 Acute cystitis with hematuria: Secondary | ICD-10-CM

## 2023-06-08 LAB — POC URINALSYSI DIPSTICK (AUTOMATED)
Bilirubin, UA: POSITIVE — AB
Blood, UA: POSITIVE — AB
Glucose, UA: POSITIVE — AB
Ketones, UA: POSITIVE — AB
Nitrite, UA: NEGATIVE
Protein, UA: POSITIVE — AB
Spec Grav, UA: 1.025 (ref 1.010–1.025)
Urobilinogen, UA: 1 U/dL
pH, UA: 5.5 (ref 5.0–8.0)

## 2023-06-08 LAB — POCT INFLUENZA A/B
Influenza A, POC: NEGATIVE
Influenza B, POC: NEGATIVE

## 2023-06-08 LAB — POC COVID19 BINAXNOW: SARS Coronavirus 2 Ag: NEGATIVE

## 2023-06-08 MED ORDER — SULFAMETHOXAZOLE-TRIMETHOPRIM 800-160 MG PO TABS: 1.0000 | ORAL_TABLET | Freq: Two times a day (BID) | ORAL | 0 refills | Status: AC

## 2023-06-08 MED ORDER — FENOFIBRATE 160 MG PO TABS
160.0000 mg | ORAL_TABLET | Freq: Every evening | ORAL | 3 refills | Status: AC
Start: 2023-06-08 — End: ?

## 2023-06-08 MED ORDER — ONDANSETRON HCL 4 MG PO TABS: 4.0000 mg | ORAL_TABLET | Freq: Three times a day (TID) | ORAL | 0 refills | Status: AC | PRN

## 2023-06-08 NOTE — Telephone Encounter (Signed)
Noted

## 2023-06-08 NOTE — Assessment & Plan Note (Signed)
Advised to Work on low cholesterol diet and exercise as tolerated Refill fenofibrate

## 2023-06-08 NOTE — Assessment & Plan Note (Signed)
Thought to be GI virus however have advised pt if worsens in severity and or does not improve after 5 days to obtain stool samples.

## 2023-06-08 NOTE — Telephone Encounter (Signed)
Copied from CRM [id number]. Topic: Appointments - Appointment Scheduling >> Jun 07, 2023  3:26 PM Tiffany H wrote: Patient/patient representative is calling to schedule an appointment. Refer to attachments for appointment information.   Patient has persistent diarrhea for the past 2 days. She is nauseous and taking medication. Scheduled patient for tomorrow at 11:20AM.  Per chart review patient recently started on Macrobid for UTI. Just FYI

## 2023-06-08 NOTE — Patient Instructions (Signed)
I ordered stool cultures in case symptoms do not seem to improve.

## 2023-06-08 NOTE — Progress Notes (Signed)
Established Patient Office Visit  Subjective:   Patient ID: Anna [name], female    DOB: [birth year]  Age: 58 y.o. MRN: [id number]  CC:  Chief Complaint  Patient presents with   Diarrhea    With nausea and vertigo x 2 days      HPI: Anna [name] is a 58 y.o. female presenting on 06/08/2023 for Diarrhea (With nausea and vertigo x 2 days /)  Kris Mouton kids have been sick throwing up last week and she was around them five days ago.   She felt like she had a UTI five days ago, called her gynecologist who called in a medication. She was having lower back pain and she was having dysuria. She took a pill that evening with dinner and when she got up the next day she was fatigued, weak, and had diarrhea with nausea. Felt like she was going to throw up but didn't but would come into her throat and cause burning sensation. Symptoms have been pretty bad since two days ago. She has taken immodium which helped with decreasing the diarrhea.   Has not noticed a fever, but has had chills.  No sore throat congestion or cough.  No abdominal pain.   She has been unable to eat much had an egg and a piece of toast yesterday.     ROS: Negative unless specifically indicated above in HPI.   Relevant past medical history reviewed and updated as indicated.   Allergies and medications reviewed and updated.   Current Outpatient Medications:    aspirin EC 81 MG tablet, Take 1 tablet (81 mg total) by mouth daily. Swallow whole., Disp: 90 tablet, Rfl: 3   cholecalciferol (VITAMIN D3) 25 MCG (1000 UNIT) tablet, Take 2,000 Units by mouth daily., Disp: , Rfl:    ezetimibe (ZETIA) 10 MG tablet, Take 1 tablet (10 mg total) by mouth daily., Disp: 90 tablet, Rfl: 3   loratadine (CLARITIN) 10 MG tablet, Take 10 mg by mouth daily as needed for allergies., Disp: , Rfl:    losartan (COZAAR) 25 MG tablet, Take 1 tablet (25 mg total) by mouth every morning., Disp: 90 tablet, Rfl: 3   Multiple Vitamins-Minerals (OCUVITE  PRESERVISION PO), Take 1 tablet by mouth in the morning and at bedtime., Disp: , Rfl:    Omega-3 Fatty Acids (FISH OIL PO), Take 1 capsule by mouth daily., Disp: , Rfl:    ondansetron (ZOFRAN) 4 MG tablet, Take 1 tablet (4 mg total) by mouth every 8 (eight) hours as needed for nausea or vomiting., Disp: 20 tablet, Rfl: 0   sulfamethoxazole-trimethoprim (BACTRIM DS) 800-160 MG tablet, Take 1 tablet by mouth 2 (two) times daily for 7 days., Disp: 14 tablet, Rfl: 0   traZODone (DESYREL) 100 MG tablet, Take 1 tablet (100 mg total) by mouth at bedtime., Disp: 90 tablet, Rfl: 3   Turmeric (QC TUMERIC COMPLEX PO), Take 1 tablet by mouth daily., Disp: , Rfl:    fenofibrate 160 MG tablet, Take 1 tablet (160 mg total) by mouth every evening., Disp: 90 tablet, Rfl: 3  Allergies  Allergen Reactions   Macrobid [Nitrofurantoin] Other (See Comments)    dizziness   Buspirone Other (See Comments)    Brain 'buzzes'   Lexapro [Escitalopram] Other (See Comments)    Decreased sexual libido   Penicillins Other (See Comments)    Has patient had a PCN reaction causing immediate rash, facial/tongue/throat swelling, SOB or lightheadedness with hypotension: unknown Has patient had a PCN reaction  causing severe rash involving mucus membranes or skin necrosis: unknown Has patient had a PCN reaction that required hospitalization: No Has patient had a PCN reaction occurring within the last 10 years: No If all of the above answers are "NO", then may proceed with Cephalosporin use.    Statins Other (See Comments)    Body aches   Wellbutrin [Bupropion] Itching    Objective:   BP 120/64   Pulse 80   Temp 98.6 F (37 C) (Temporal)   Ht 5\' 3"  (1.6 m)   SpO2 99%   BMI 23.74 kg/m    Wt Readings from Last 3 Encounters:  05/11/23 134 lb (60.8 kg)  03/29/23 133 lb 3.2 oz (60.4 kg)  03/13/23 134 lb (60.8 kg)    Physical Exam Constitutional:      General: She is not in acute distress.    Appearance: Normal  appearance. She is normal weight. She is not ill-appearing, toxic-appearing or diaphoretic.  HENT:     Head: Normocephalic.     Right Ear: Tympanic membrane normal.     Left Ear: Tympanic membrane normal.     Nose: Nose normal.     Mouth/Throat:     Mouth: Mucous membranes are dry.     Pharynx: No oropharyngeal exudate or posterior oropharyngeal erythema.  Eyes:     Extraocular Movements: Extraocular movements intact.     Pupils: Pupils are equal, round, and reactive to light.  Cardiovascular:     Rate and Rhythm: Normal rate and regular rhythm.     Pulses: Normal pulses.     Heart sounds: Normal heart sounds.  Pulmonary:     Effort: Pulmonary effort is normal.     Breath sounds: Normal breath sounds.  Abdominal:     General: Bowel sounds are increased.     Palpations: Abdomen is soft.     Tenderness: There is no abdominal tenderness. There is no guarding or rebound.  Musculoskeletal:     Cervical back: Normal range of motion.  Neurological:     General: No focal deficit present.     Mental Status: She is alert and oriented to person, place, and time. Mental status is at baseline.  Psychiatric:        Mood and Affect: Mood normal.        Behavior: Behavior normal.        Thought Content: Thought content normal.        Judgment: Judgment normal.     Assessment & Plan:  Mixed hyperlipidemia Assessment & Plan: Advised to Work on low cholesterol diet and exercise as tolerated Refill fenofibrate   Orders: -     Fenofibrate; Take 1 tablet (160 mg total) by mouth every evening.  Dispense: 90 tablet; Refill: 3  Diarrhea of presumed infectious origin Assessment & Plan: Thought to be GI virus however have advised pt if worsens in severity and or does not improve after 5 days to obtain stool samples.   Orders: -     POCT Urinalysis Dipstick (Automated) -     Gastrointestinal Pathogen Pnl RT, PCR; Future -     C. difficile GDH and Toxin A/B; Future  Nausea Assessment &  Plan: Rx zofran 4 mg sent to pharmacy for nausea spells  Orders: -     Ondansetron HCl; Take 1 tablet (4 mg total) by mouth every 8 (eight) hours as needed for nausea or vomiting.  Dispense: 20 tablet; Refill: 0 -     POC COVID-19 BinaxNow -  POCT Influenza A/B  Dysuria -     Urinalysis w microscopic + reflex cultur -     Giardia antigen; Future  Acute cystitis with hematuria Assessment & Plan: poct urine dip in office Urine culture ordered pending results antbx sent to pharmacy, pt to take as directed. Encouraged increased water intake throughout the day. Choosing to treat due to being symptomatic. If no improvement in the next 2 days pt advised to let me know.   Orders: -     Sulfamethoxazole-Trimethoprim; Take 1 tablet by mouth 2 (two) times daily for 7 days.  Dispense: 14 tablet; Refill: 0  No QT prolongation on 12/05/22 ekg   Follow up plan: Return if symptoms worsen or fail to improve.  Mort Sawyers, FNP

## 2023-06-08 NOTE — Assessment & Plan Note (Signed)
Rx zofran 4 mg sent to pharmacy for nausea spells

## 2023-06-08 NOTE — Assessment & Plan Note (Signed)
poct urine dip in office Urine culture ordered pending results antbx sent to pharmacy, pt to take as directed. Encouraged increased water intake throughout the day. Choosing to treat due to being symptomatic. If no improvement in the next 2 days pt advised to let me know.

## 2023-06-10 LAB — URINALYSIS W MICROSCOPIC + REFLEX CULTURE
Glucose, UA: NEGATIVE
Nitrites, Initial: POSITIVE — AB
Specific Gravity, Urine: 1.023 (ref 1.001–1.035)
pH: 5.5 (ref 5.0–8.0)

## 2023-06-10 LAB — URINE CULTURE
MICRO NUMBER:: 16022752
SPECIMEN QUALITY:: ADEQUATE

## 2023-06-10 LAB — CULTURE INDICATED

## 2023-06-11 ENCOUNTER — Encounter: Payer: Self-pay | Admitting: Family

## 2023-06-11 DIAGNOSIS — R311 Benign essential microscopic hematuria: Secondary | ICD-10-CM

## 2023-06-12 NOTE — Telephone Encounter (Signed)
 noted

## 2023-06-12 NOTE — Telephone Encounter (Signed)
Have we had any of the below that was reported at her last office visit or is it improving?   Is her appetite coming back?  I would suggest she come back and repeat the urine test bc there were abn but may have been from electrolytes just not sure.   Any signs of kidney stone? Sharp stabbing pains in back? Unable to void? Fever?   "fatigued, weak, and had diarrhea with nausea. Felt like she was going to throw up but didn't but would come into her throat and cause burning sensation. Symptoms have been pretty bad since two days ago. She has taken immodium which helped with decreasing the diarrhea. "

## 2023-08-04 ENCOUNTER — Other Ambulatory Visit: Payer: Self-pay

## 2023-08-04 MED ORDER — EZETIMIBE 10 MG PO TABS: 10.0000 mg | ORAL_TABLET | Freq: Every day | ORAL | 0 refills | Status: DC

## 2023-08-27 ENCOUNTER — Other Ambulatory Visit: Payer: Self-pay | Admitting: Family

## 2023-08-27 DIAGNOSIS — G479 Sleep disorder, unspecified: Secondary | ICD-10-CM

## 2023-09-01 ENCOUNTER — Ambulatory Visit: Payer: PRIVATE HEALTH INSURANCE | Attending: Physician Assistant | Admitting: Physician Assistant

## 2023-09-01 ENCOUNTER — Encounter: Payer: Self-pay | Admitting: Physician Assistant

## 2023-09-01 VITALS — BP 112/70 | HR 73 | Ht 63.0 in | Wt 138.0 lb

## 2023-09-01 DIAGNOSIS — Z789 Other specified health status: Secondary | ICD-10-CM | POA: Diagnosis not present

## 2023-09-01 DIAGNOSIS — I10 Essential (primary) hypertension: Secondary | ICD-10-CM | POA: Diagnosis not present

## 2023-09-01 DIAGNOSIS — E785 Hyperlipidemia, unspecified: Secondary | ICD-10-CM | POA: Diagnosis not present

## 2023-09-01 DIAGNOSIS — I2510 Atherosclerotic heart disease of native coronary artery without angina pectoris: Secondary | ICD-10-CM

## 2023-09-01 NOTE — Progress Notes (Signed)
 Cardiology Office Note    Date:  09/01/2023   ID:  Anna [name] 05/07/1966, MRN [id number]  PCP:  Felicita Horns, FNP  Cardiologist:  Constancia Delton, MD  Electrophysiologist:  None   Chief Complaint: Follow-up  History of Present Illness:   Anna [name] is a 58 y.o. female with a visit pertinent history of nonobstructive CAD by coronary CTA in 03/2022, HTN, and HLD with statin intolerance who presents for follow-up of CAD, HTN, and HLD.  Coronary CTA in 03/2022 showed 50 to 69% proximal LAD stenosis along with 25 to 49% nondominant LCx stenosis with a calcium score of 1487 which was the 99th percentile.  ctFFR did not reveal hemodynamically significant stenosis.  Zio patch in 03/2022 showed a predominant rhythm of sinus with an average rate of 81 bpm with rare atrial and ventricular ectopy.  Patient triggered events were associated with sinus rhythm with sinus tachycardia.  Echo in 05/2022 showed an EF of 60 to 65%, no regional wall motion normalities, grade 1 diastolic dysfunction, normal RV systolic function and ventricular cavity size, mildly elevated RVSP estimated at 42.2 mmHg, trivial mitral regurgitation, and an estimated right atrial pressure of 3 mmHg.  She was last in the office in 08/2022 and was without symptoms of angina or cardiac decompensation, remaining active at baseline walking 7-8 miles per day.  She comes in doing well from a cardiac perspective and is without symptoms of angina or cardiac decompensation.  She was noting some dizziness, though has transitioned her losartan  to the evening and with this has noted improvement in dizziness.  No presyncope or syncope.  No falls or symptoms concerning for bleeding.  Remains active at baseline typically ambulating 3 to 8 miles per day while at work.  Overall feels well from a cardiac perspective and does not have any acute concerns at this time.   Labs independently reviewed: 03/2023 - potassium 4.0, BUN 22, serum  creatinine 0.7, magnesium 1.8, LP(a) 17, TC 137, TG 55, HDL 57, LDL 69, Hgb 11.7, PLT 309 0/1027 - TSH normal  Past Medical History:  Diagnosis Date   Allergy    Anemia    Anxiety    Arthritis of low back    Bilateral low back pain with right-sided sciatica    Biliary colic    Coronary artery disease 04/04/2022   a.) cCTA 04/04/2022: Ca2+ 1487 (99th percentile age/sex/race matched control) --> ctFFR suggested no significant stenosis.   Depression    Diastolic dysfunction 05/17/2022   a.) TTE 05/17/2022: EF 60-65%, no RWMAs, GLS -20.9%, PASP 42.2, triv MR, G1DD   Family history of adverse reaction to anesthesia    a.) delayed emergence in second degree relative (maternal niece)   Heart murmur    History of tobacco use    Hyperlipidemia    Insomnia    a.) takes trazodone  PRN   Palpitations    Primary hypertension 09/26/2022   PSVT (paroxysmal supraventricular tachycardia) (HCC)    Seizure disorder (HCC)    very rare and when it happens pt states she loses her vision short term-last episode around 2022   Statin intolerance    Tobacco abuse 07/18/2016    Past Surgical History:  Procedure Laterality Date   CATARACT EXTRACTION, BILATERAL     CHOLECYSTECTOMY N/A 01/13/2023   Procedure: LAPAROSCOPIC CHOLECYSTECTOMY WITH INTRAOPERATIVE CHOLANGIOGRAM;  Surgeon: Awilda Bogus, MD;  Location: [facility];  Service: General;  Laterality: N/A;   COLONOSCOPY  01/10/2012   Procedure:  COLONOSCOPY;  Surgeon: Beau Bound, MD;  Location: [facility];  Service: Gastroenterology;  Laterality: N/A;   CYST EXCISION Right    hand   DILATION AND CURETTAGE OF UTERUS     Ablation   FOOT SURGERY Left    HAND SURGERY     LUMBAR FUSION     TONSILLECTOMY      Current Medications: Current Meds  Medication Sig   aspirin  EC 81 MG tablet Take 1 tablet (81 mg total) by mouth daily. Swallow whole.   cholecalciferol (VITAMIN D3) 25 MCG (1000 UNIT) tablet Take 2,000 Units by mouth daily.   ezetimibe   (ZETIA ) 10 MG tablet Take 1 tablet (10 mg total) by mouth daily. PLEASE CALL [phone] TO SCHEDULE YEARLY VISIT PRIOR TO NEXT REFILL REQUEST. THANK YOU.   fenofibrate  160 MG tablet Take 1 tablet (160 mg total) by mouth every evening.   loratadine (CLARITIN) 10 MG tablet Take 10 mg by mouth daily as needed for allergies.   losartan  (COZAAR ) 25 MG tablet Take 1 tablet (25 mg total) by mouth every morning.   Multiple Vitamins-Minerals (OCUVITE PRESERVISION PO) Take 1 tablet by mouth in the morning and at bedtime.   Omega-3 Fatty Acids (FISH OIL PO) Take 1 capsule by mouth daily.   traZODone  (DESYREL ) 100 MG tablet TAKE 1 TABLET BY MOUTH EVERYDAY AT BEDTIME   Turmeric (QC TUMERIC COMPLEX PO) Take 1 tablet by mouth daily.    Allergies:   Macrobid  [nitrofurantoin ], Buspirone, Lexapro  [escitalopram ], Penicillins, Statins, and Wellbutrin [bupropion]   Social History   Socioeconomic History   Marital status: Divorced    Spouse name: Not on file   Number of children: 2   Years of education: 14   Highest education level: Not on file  Occupational History    Employer: [employer]  Tobacco Use   Smoking status: Every Day    Current packs/day: 0.50    Average packs/day: 0.5 packs/day for 39.3 years (19.7 ttl pk-yrs)    Types: Cigarettes    Start date: 05/09/1984   Smokeless tobacco: Never  Vaping Use   Vaping status: Never Used  Substance and Sexual Activity   Alcohol use: Not Currently   Drug use: No   Sexual activity: Not Currently    Partners: Male    Birth control/protection: Surgical    Comment: ablation, tubal  Other Topics Concern   Not on file  Social History Narrative   Lives alone   Two grown daughters   Significant friend - 1 1/2 years   Loves to walk   Social Drivers of Corporate investment banker Strain: Low Risk  (03/13/2023)   Overall Financial Resource Strain (CARDIA)    Difficulty of Paying Living Expenses: Not hard at all  Food Insecurity:  No Food Insecurity (03/13/2023)   Hunger Vital Sign    Worried About Running Out of Food in the Last Year: Never true    Ran Out of Food in the Last Year: Never true  Transportation Needs: No Transportation Needs (03/13/2023)   PRAPARE - Administrator, Civil Service (Medical): No    Lack of Transportation (Non-Medical): No  Physical Activity: Sufficiently Active (03/13/2023)   Exercise Vital Sign    Days of Exercise per Week: 6 days    Minutes of Exercise per Session: 100 min  Stress: No Stress Concern Present (03/13/2023)   [employer] of Occupational Health - Occupational Stress Questionnaire    Feeling of Stress :  Not at all  Social Connections: Moderately Isolated (03/13/2023)   Social Connection and Isolation Panel [NHANES]    Frequency of Communication with Friends and Family: More than three times a week    Frequency of Social Gatherings with Friends and Family: Once a week    Attends Religious Services: Never    Database administrator or Organizations: No    Attends Engineer, structural: Never    Marital Status: Living with partner     Family History:  The patient's family history includes Arthritis in her father; Breast cancer in her cousin and cousin; Diabetes in her mother; Heart attack (age of onset: 74) in her mother; Heart disease in her mother; Hyperlipidemia in her father and mother; Hypertension in her mother; Kidney disease in her mother; Non-Hodgkin's lymphoma in her father; Thyroid  disease in her daughter.  ROS:   12-point review of systems is negative unless otherwise noted in the HPI.   EKGs/Labs/Other Studies Reviewed:    Studies reviewed were summarized above. The additional studies were reviewed today:  2D echo 05/17/2022: 1. Left ventricular ejection fraction, by estimation, is 60 to 65%. The  left ventricle has normal function. The left ventricle has no regional  wall motion abnormalities. Left ventricular diastolic parameters are   consistent with Grade I diastolic  dysfunction (impaired relaxation). The average left ventricular global  longitudinal strain is -20.9 %.   2. Right ventricular systolic function is normal. The right ventricular  size is normal. There is mildly elevated pulmonary artery systolic  pressure. The estimated right ventricular systolic pressure is 42.2 mmHg.   3. The mitral valve is normal in structure. Trivial mitral valve  regurgitation. No evidence of mitral stenosis.   4. The aortic valve is normal in structure. Aortic valve regurgitation is  not visualized. No aortic stenosis is present.   5. The inferior vena cava is normal in size with greater than 50%  respiratory variability, suggesting right atrial pressure of 3 mmHg.  __________  Zio patch 03/2022: Patient had a min HR of 53 bpm, max HR of 153 bpm, and avg HR of 81 bpm. Predominant underlying rhythm was Sinus Rhythm. Isolated SVEs were rare (<1.0%), SVE Couplets were rare (<1.0%), and SVE Triplets were rare (<1.0%). Isolated VEs were rare (<1.0%),  VE Couplets were rare (<1.0%), and no VE Triplets were present.    Conclusion Benign cardiac monitor with no significant arrhythmias. Patient triggered events associated with sinus rhythm with sinus tachycardia. __________  Coronary CTA 04/04/2022: FINDINGS: Aorta:  Normal size.  No calcifications.  No dissection.   Aortic Valve:  Trileaflet.  No calcifications.   Coronary Arteries:  Normal coronary origin.  Right dominance.   RCA is a dominant artery that gives rise to PDA and PLA. There is calcified plaque in the proximal RCA causing mild stenosis.   Left main gives rise to LAD and LCX arteries.  LM has no stenosis   LAD has calcified plaque proximally causing moderate stenosis (50-69%).   LCX is a non-dominant artery that gives rise to two obtuse marginal branches. There is calcified plaque proximally causing mild stenosis (25-49%).   Other findings:   Normal  pulmonary vein drainage into the left atrium.   Normal left atrial appendage without a thrombus.   Normal size of the pulmonary artery.   IMPRESSION: 1. Coronary calcium score of 1487. This was 99th percentile for age and sex matched control. 2. Normal coronary origin with right dominance. 3. Calcified plaque  in the proximal LAD causing moderate stenosis (50-69%). 4. Mild RCA and LCx stenosis (25-49%). 5. CAD-RADS 3. Moderate stenosis. Consider symptom-guided anti-ischemic pharmacotherapy as well as risk factor modification per guideline directed care. 6. Additional analysis with CT FFR will be submitted and reported separately.  ctFFR: 1. Left Main:  No significant stenosis. 2. LAD: No significant stenosis. FFRct 0.86 3. LCX: No significant stenosis. FFRct 0.85 4. RCA: No significant stenosis. FFRct 0.95   IMPRESSION: 1.  CT FFR analysis didn't show any significant stenosis.    EKG:  EKG is ordered today.  The EKG ordered today demonstrates NSR, 73 bpm, left anterior fascicular block, nonspecific ST-T changes, consistent with prior tracing  Recent Labs: 09/26/2022: TSH 2.46 01/11/2023: ALT 20 03/29/2023: BUN 22; Creatinine, Ser 0.70; Hemoglobin 11.7; Magnesium 1.8; Platelets 309.0; Potassium 4.0; Sodium 141  Recent Lipid Panel    Component Value Date/Time   CHOL 137 03/29/2023 0920   TRIG 55.0 03/29/2023 0920   HDL 57.00 03/29/2023 0920   CHOLHDL 2 03/29/2023 0920   VLDL 11.0 03/29/2023 0920   LDLCALC 69 03/29/2023 0920    PHYSICAL EXAM:    VS:  BP 112/70 (BP Location: Left Arm, Patient Position: Sitting)   Pulse 73   Ht 5\' 3"  (1.6 m)   Wt 138 lb (62.6 kg)   SpO2 98%   BMI 24.45 kg/m   BMI: Body mass index is 24.45 kg/m.  Physical Exam Vitals reviewed.  Constitutional:      Appearance: She is well-developed.  HENT:     Head: Normocephalic and atraumatic.  Eyes:     General:        Right eye: No discharge.        Left eye: No discharge.   Cardiovascular:     Rate and Rhythm: Normal rate and regular rhythm.     Heart sounds: Normal heart sounds, S1 normal and S2 normal. Heart sounds not distant. No midsystolic click and no opening snap. No murmur heard.    No friction rub.  Pulmonary:     Effort: Pulmonary effort is normal. No respiratory distress.     Breath sounds: Normal breath sounds. No decreased breath sounds, wheezing, rhonchi or rales.  Chest:     Chest wall: No tenderness.  Musculoskeletal:     Cervical back: Normal range of motion.     Right lower leg: No edema.     Left lower leg: No edema.  Skin:    General: Skin is warm and dry.     Nails: There is no clubbing.  Neurological:     Mental Status: She is alert and oriented to person, place, and time.  Psychiatric:        Speech: Speech normal.        Behavior: Behavior normal.        Thought Content: Thought content normal.        Judgment: Judgment normal.     Wt Readings from Last 3 Encounters:  09/01/23 138 lb (62.6 kg)  05/11/23 134 lb (60.8 kg)  03/29/23 133 lb 3.2 oz (60.4 kg)     ASSESSMENT & PLAN:   CAD involving native coronary arteries without angina: She continues to do very well and is without symptoms of angina or cardiac decompensation.  Continue aggressive risk factor modification and primary prevention including aspirin  81 mg, ezetimibe  10 mg, fenofibrate  160 mg, and losartan  25 mg.  Intolerant to statin as outlined below.  No indication for further ischemic testing at  this time.  HTN: Blood pressure is well-controlled in the office today.  Continue losartan  25 mg nightly with noted improvement in dizziness.  HLD with statin intolerance: LDL 69 in 03/2023.  She remains on ezetimibe  10 mg and fenofibrate  160 mg each evening.  Check CMP and fasting lipid panel.     Disposition: F/u with Dr. Junnie Olives or an APP in 12 months, sooner if needed.   Medication Adjustments/Labs and Tests Ordered: Current medicines are reviewed at  length with the patient today.  Concerns regarding medicines are outlined above. Medication changes, Labs and Tests ordered today are summarized above and listed in the Patient Instructions accessible in Encounters.   Signed, Varney Gentleman, PA-C 09/01/2023 9:04 AM     [facility] [place] [address] [place], [place] [address] [phone]

## 2023-09-01 NOTE — Patient Instructions (Signed)
 Medication Instructions:  NO CHANGES  *If you need a refill on your cardiac medications before your next appointment, please call your pharmacy*  Lab Work: CMET, CBC, Lipid Panel today   If you have labs (blood work) drawn today and your tests are completely normal, you will receive your results only by: MyChart Message (if you have MyChart) OR A paper copy in the mail If you have any lab test that is abnormal or we need to change your treatment, we will call you to review the results.  Follow-Up: At [facility], you and your health needs are our priority.  As part of our continuing mission to provide you with exceptional heart care, our providers are all part of one team.  This team includes your primary Cardiologist (physician) and Advanced Practice Providers or APPs (Physician Assistants and Nurse Practitioners) who all work together to provide you with the care you need, when you need it.  Your next appointment:    1 year with Varney Gentleman, PA  We recommend signing up for the patient portal called "MyChart".  Sign up information is provided on this After Visit Summary.  MyChart is used to connect with patients for Virtual Visits (Telemedicine).  Patients are able to view lab/test results, encounter notes, upcoming appointments, etc.  Non-urgent messages can be sent to your provider as well.   To learn more about what you can do with MyChart, go to [email or web address].

## 2023-09-02 LAB — COMPREHENSIVE METABOLIC PANEL WITH GFR
ALT: 14 IU/L (ref 0–32)
AST: 19 IU/L (ref 0–40)
Albumin: 4.5 g/dL (ref 3.8–4.9)
Alkaline Phosphatase: 58 IU/L (ref 44–121)
BUN/Creatinine Ratio: 26 — ABNORMAL HIGH (ref 9–23)
BUN: 21 mg/dL (ref 6–24)
Bilirubin Total: 0.4 mg/dL (ref 0.0–1.2)
CO2: 23 mmol/L (ref 20–29)
Calcium: 9.9 mg/dL (ref 8.7–10.2)
Chloride: 107 mmol/L — ABNORMAL HIGH (ref 96–106)
Creatinine, Ser: 0.81 mg/dL (ref 0.57–1.00)
Globulin, Total: 2 g/dL (ref 1.5–4.5)
Glucose: 86 mg/dL (ref 70–99)
Potassium: 4 mmol/L (ref 3.5–5.2)
Sodium: 144 mmol/L (ref 134–144)
Total Protein: 6.5 g/dL (ref 6.0–8.5)
eGFR: 84 mL/min/{1.73_m2} (ref >59)

## 2023-09-02 LAB — LIPID PANEL
Chol/HDL Ratio: 2.4 ratio (ref 0.0–4.4)
Cholesterol, Total: 133 mg/dL (ref 100–199)
HDL: 56 mg/dL (ref >39)
LDL Chol Calc (NIH): 64 mg/dL (ref 0–99)
Triglycerides: 61 mg/dL (ref 0–149)
VLDL Cholesterol Cal: 13 mg/dL (ref 5–40)

## 2023-09-02 LAB — CBC
Hematocrit: 33.1 % — ABNORMAL LOW (ref 34.0–46.6)
Hemoglobin: 11 g/dL — ABNORMAL LOW (ref 11.1–15.9)
MCH: 30.5 pg (ref 26.6–33.0)
MCHC: 33.2 g/dL (ref 31.5–35.7)
MCV: 92 fL (ref 79–97)
Platelets: 283 10*3/uL (ref 150–450)
RBC: 3.61 x10E6/uL — ABNORMAL LOW (ref 3.77–5.28)
RDW: 12.3 % (ref 11.7–15.4)
WBC: 4.8 10*3/uL (ref 3.4–10.8)

## 2023-10-04 ENCOUNTER — Other Ambulatory Visit: Payer: Self-pay

## 2023-10-04 MED ORDER — EZETIMIBE 10 MG PO TABS: 10.0000 mg | ORAL_TABLET | Freq: Every day | ORAL | 6 refills | Status: DC

## 2024-02-01 ENCOUNTER — Other Ambulatory Visit: Payer: Self-pay | Admitting: Family

## 2024-02-15 ENCOUNTER — Other Ambulatory Visit: Payer: Self-pay | Admitting: Family

## 2024-02-15 ENCOUNTER — Telehealth: Payer: Self-pay

## 2024-02-15 DIAGNOSIS — Z1231 Encounter for screening mammogram for malignant neoplasm of breast: Secondary | ICD-10-CM

## 2024-02-15 NOTE — Telephone Encounter (Signed)
 Copied from CRM #[id number]. Topic: Clinical - Request for Lab/Test Order >> Feb 15, 2024 11:08 AM Viola FALCON wrote: Reason for CRM: Patient needs diagnostic order put in for a mammogram, please let patient know once order is in at  [phone] (M)

## 2024-02-16 ENCOUNTER — Other Ambulatory Visit: Payer: Self-pay | Admitting: *Deleted

## 2024-02-16 NOTE — Telephone Encounter (Signed)
 If she needs a diagnostic mammogram she will need an appointment. This would mean she is worried about a mass or something to get an dx.

## 2024-02-16 NOTE — Telephone Encounter (Signed)
 Spoke with pt and she has been scheduled to see Tabitha on 02/19/24 at 1240.

## 2024-02-19 ENCOUNTER — Ambulatory Visit (INDEPENDENT_AMBULATORY_CARE_PROVIDER_SITE_OTHER): Payer: PRIVATE HEALTH INSURANCE | Admitting: Family

## 2024-02-19 ENCOUNTER — Encounter: Payer: Self-pay | Admitting: Family

## 2024-02-19 VITALS — BP 112/70 | HR 101 | Temp 98.7°F | Ht 63.0 in | Wt 139.0 lb

## 2024-02-19 DIAGNOSIS — R1013 Epigastric pain: Secondary | ICD-10-CM | POA: Insufficient documentation

## 2024-02-19 DIAGNOSIS — M79604 Pain in right leg: Secondary | ICD-10-CM | POA: Insufficient documentation

## 2024-02-19 DIAGNOSIS — N644 Mastodynia: Secondary | ICD-10-CM | POA: Insufficient documentation

## 2024-02-19 DIAGNOSIS — R9230 Dense breasts, unspecified: Secondary | ICD-10-CM | POA: Diagnosis not present

## 2024-02-19 MED ORDER — OMEPRAZOLE 20 MG PO CPDR: 20.0000 mg | DELAYED_RELEASE_CAPSULE | Freq: Every day | ORAL | 3 refills | Status: AC

## 2024-02-19 NOTE — Patient Instructions (Signed)
  I have sent an electronic order over to your preferred location for the following:   [x]   Bilateral diagnostic mammogram and left breast ultrasound  Please give this center a call to get scheduled at your convenience.   [x]   [facility]      [address] [place], [place]        [phone]         Make sure to wear two piece  clothing  No lotions powders or deodorants the day of the appointment Make sure to bring picture ID and insurance card.  Bring list of medications you are currently taking including any supplements.

## 2024-02-19 NOTE — Progress Notes (Signed)
 Established Patient Office Visit  Subjective:      CC:  Chief Complaint  Patient presents with   Breast Problem    Pulling in left breast for few months.    HPI: Anna [name] is a 58 y.o. female presenting on 02/19/2024 for Breast Problem (Pulling in left breast for few months.) .  Discussed the use of AI scribe software for clinical note transcription with the patient, who gave verbal consent to proceed.  History of Present Illness Anna [name] is a 58 year old female who presents with a pulling sensation in the left breast area.  She has been experiencing a pulling sensation in the left breast area for the past couple of months. The sensation occurs randomly, regardless of her position, whether lying down, sitting, or walking. It is not reproducible with movement and is not associated with tenderness or pain. No nipple discharge or rash are reported. She has a family history of breast cancer, with one cousin having been diagnosed.  She denies any symptoms suggestive of cardiac issues such as chest tightness, heart pain, or shortness of breath. She has a history of anxiety-related heart sensations but notes that these are not related to her current symptoms.  She underwent gallbladder surgery in October of the previous year and reports experiencing symptoms consistent with dumping syndrome, including diarrhea without warning. She is currently managing these symptoms and has been taking probiotic and prebiotic gummies, although not consistently.  She also reports a warm spot on her right mid-shin that she noticed last week. It is associated with slight swelling but no pain or significant redness. She has a family history of blood clots, as her mother had them, but her mother also had a history of hormone use following a hysterectomy at a young age.  She takes an aspirin  daily and is on cholesterol medication, which she takes regularly. No issues with bleeding or bruising. She smokes  occasionally.         Social history:  Relevant past medical, surgical, family and social history reviewed and updated as indicated. Interim medical history since our last visit reviewed.  Allergies and medications reviewed and updated.  DATA REVIEWED: CHART IN EPIC     ROS: Negative unless specifically indicated above in HPI.    Current Outpatient Medications:    aspirin  EC 81 MG tablet, Take 1 tablet (81 mg total) by mouth daily. Swallow whole., Disp: 90 tablet, Rfl: 3   cholecalciferol (VITAMIN D3) 25 MCG (1000 UNIT) tablet, Take 2,000 Units by mouth daily., Disp: , Rfl:    ezetimibe  (ZETIA ) 10 MG tablet, Take 1 tablet (10 mg total) by mouth daily. P, Disp: 30 tablet, Rfl: 6   fenofibrate  160 MG tablet, Take 1 tablet (160 mg total) by mouth every evening., Disp: 90 tablet, Rfl: 3   loratadine (CLARITIN) 10 MG tablet, Take 10 mg by mouth daily as needed for allergies., Disp: , Rfl:    losartan  (COZAAR ) 25 MG tablet, TAKE ONE TABLET BY MOUTH EVERY MORNING, Disp: 90 tablet, Rfl: 1   Multiple Vitamins-Minerals (OCUVITE PRESERVISION PO), Take 1 tablet by mouth in the morning and at bedtime., Disp: , Rfl:    Omega-3 Fatty Acids (FISH OIL PO), Take 1 capsule by mouth daily., Disp: , Rfl:    omeprazole (PRILOSEC) 20 MG capsule, Take 1 capsule (20 mg total) by mouth daily., Disp: 30 capsule, Rfl: 3   traZODone  (DESYREL ) 100 MG tablet, TAKE 1 TABLET BY MOUTH EVERYDAY AT  BEDTIME, Disp: 90 tablet, Rfl: 3   Turmeric (QC TUMERIC COMPLEX PO), Take 1 tablet by mouth daily., Disp: , Rfl:    ondansetron  (ZOFRAN ) 4 MG tablet, Take 1 tablet (4 mg total) by mouth every 8 (eight) hours as needed for nausea or vomiting. (Patient not taking: Reported on 02/19/2024), Disp: 20 tablet, Rfl: 0        Objective:        BP 112/70   Pulse (!) 101   Temp 98.7 F (37.1 C) (Temporal)   Ht 5' 3 (1.6 m)   Wt 139 lb (63 kg)   SpO2 98%   BMI 24.62 kg/m   Physical Exam BREAST: Breasts with  densities, slightly tender. ABDOMEN: Slight tenderness. EXTREMITIES: Right mid shin warm, slightly swollen, no pain on palpation.  Wt Readings from Last 3 Encounters:  02/19/24 139 lb (63 kg)  09/01/23 138 lb (62.6 kg)  05/11/23 134 lb (60.8 kg)    Physical Exam Constitutional:      Appearance: Normal appearance.  Chest:  Breasts:    Right: Mass (right breast density 12 o clock position as well mildly tender on palpation) present. No swelling, bleeding or inverted nipple.     Left: Mass (left breast density 12 o clock postion with mild tenderness) present. No swelling, bleeding or inverted nipple.  Musculoskeletal:     Comments: Some slight palpable tenderness to right mid shin with possible very slight effusion   Negative homans sign  No pedal edema  No erythema   Lymphadenopathy:     Upper Body:     Right upper body: No axillary adenopathy.     Left upper body: No axillary adenopathy.  Neurological:     Mental Status: She is alert.          Results   Assessment & Plan:   Assessment and Plan Assessment & Plan Left breast pain and tenderness Intermittent pulling sensation in the left breast for a few months without associated tenderness, nipple discharge, or rash. Family history includes one cousin with breast cancer. Physical exam reveals breast density but no palpable mass. Symptoms warrant further investigation. - Order bilateral diagnostic mammogram and left breast ultrasound. - Instruct her to contact [facility] breast imaging to schedule the tests. - Discuss potential need for further imaging or biopsy if indicated by initial results.  Dumping syndrome with chronic diarrhea post-cholecystectomy Chronic diarrhea following cholecystectomy performed a year ago, consistent with dumping syndrome. Symptoms include diarrhea without warning. - Provide dietary plan for managing dumping syndrome. - Prescribe Prilosec for two weeks to manage potential heartburn and  reduce symptoms. - Discuss dietary triggers such as spicy foods, fried foods, caffeine, chocolate, and tomato-based sauces. - Advise monitoring for correlation between symptoms and dietary intake or anxiety.  Warmth and swelling of right mid-shin, possible localized inflammation Warmth and slight swelling in the right mid-shin area for about a week. No significant pain or redness. Differential includes localized inflammation versus deep vein thrombosis (DVT). Physical exam findings are reassuring, but she has a family history of blood clots. - Advise her to report any significant changes or development of symptoms such as increased swelling, warmth, or pain. - Discuss potential need for ultrasound if symptoms worsen or persist.        Return if symptoms worsen or fail to improve.     Ginger Patrick, MSN, APRN, FNP-C [facility]

## 2024-02-20 ENCOUNTER — Other Ambulatory Visit: Payer: Self-pay | Admitting: Family

## 2024-02-20 DIAGNOSIS — N644 Mastodynia: Secondary | ICD-10-CM

## 2024-02-20 DIAGNOSIS — R9230 Dense breasts, unspecified: Secondary | ICD-10-CM

## 2024-03-05 ENCOUNTER — Ambulatory Visit: Admission: RE | Admit: 2024-03-05 | Payer: PRIVATE HEALTH INSURANCE | Source: Ambulatory Visit

## 2024-03-05 ENCOUNTER — Ambulatory Visit
Admission: RE | Admit: 2024-03-05 | Discharge: 2024-03-05 | Disposition: A | Payer: PRIVATE HEALTH INSURANCE | Source: Ambulatory Visit | Attending: Family

## 2024-03-05 DIAGNOSIS — R9230 Dense breasts, unspecified: Secondary | ICD-10-CM

## 2024-03-05 DIAGNOSIS — N644 Mastodynia: Secondary | ICD-10-CM

## 2024-03-06 ENCOUNTER — Ambulatory Visit: Payer: Self-pay | Admitting: Family

## 2024-03-28 ENCOUNTER — Other Ambulatory Visit: Payer: Self-pay | Admitting: Cardiology

## 2024-04-08 ENCOUNTER — Ambulatory Visit: Payer: Self-pay

## 2024-04-08 NOTE — Telephone Encounter (Signed)
 FYI Only or Action Required?: FYI only for provider: appointment scheduled on 12.2.25.  Patient was last seen in primary care on 02/19/2024 by Corwin Antu, FNP.  Called Nurse Triage reporting Back Pain.  Symptoms began several days ago.  Interventions attempted: OTC medications: ibuprofen.  Symptoms are: unchanged.  Triage Disposition: See PCP When Office is Open (Within 3 Days)  Patient/caregiver understands and will follow disposition?: Yes   Copied from CRM [id number]. Topic: Clinical - Red Word Triage >> Apr 08, 2024 11:35 AM Joesph NOVAK wrote: Red Word that prompted transfer to Nurse Triage: Possible kidney stones, excruciating lower back pain. Nausea. Frequent urination. >> Apr 08, 2024 12:04 PM Susanna ORN wrote: Patient returning call back after being disconnected. Called [facility] and spoke with nurse Powell and she stated she would contact patient back. Patient stated that she can't continue to hold as she's on her lunch break.  >> Apr 08, 2024 11:51 AM Joesph B wrote: Pt disconnected, attempted to call back Reason for Disposition  [1] MODERATE back pain (e.g., interferes with normal activities) AND [2] present > 3 days  Answer Assessment - Initial Assessment Questions Back pain that started on Thursday evening. Lower left back, into side, and radiates into groin. States looked up symptoms and thought maybe a kidney stone. Pt has no hx of stones. States it feels like a pulled muscle, achy and hurts to walk. She did also mention that her left arm feels like she had a flu shot in it. Denies any chest pain, shortness of breath, weakness or numbness in that arm. Does feel like she is urinating more frequently but states she also has been drinking more water .      1. ONSET: When did the pain begin? (e.g., minutes, hours, days)     Thursday 2. LOCATION: Where does it hurt? (upper, mid or lower back)     Left lower back 3. SEVERITY: How bad is the pain?  (e.g., Scale 1-10; mild,  moderate, or severe)     6/10 4. RADIATION: Does the pain shoot into your legs or somewhere else?     Left groin 6. CAUSE:  What do you think is causing the back pain?      unknown 7. BACK OVERUSE:  Any recent lifting of heavy objects, strenuous work or exercise?     no 8. MEDICINES: What have you taken so far for the pain? (e.g., nothing, acetaminophen , NSAIDS)     ibuprofen 9. NEUROLOGIC SYMPTOMS: Do you have any weakness, numbness, or problems with bowel/bladder control?     denies 10. OTHER SYMPTOMS: Do you have any other symptoms? (e.g., fever, abdomen pain, burning with urination, blood in urine)       Frequency with urination, denis the rest.  Protocols used: Back Pain-A-AH

## 2024-04-09 ENCOUNTER — Encounter: Payer: Self-pay | Admitting: Family

## 2024-04-09 ENCOUNTER — Ambulatory Visit: Payer: Self-pay

## 2024-04-09 ENCOUNTER — Ambulatory Visit: Payer: PRIVATE HEALTH INSURANCE | Admitting: Family

## 2024-04-09 ENCOUNTER — Ambulatory Visit
Admission: RE | Admit: 2024-04-09 | Discharge: 2024-04-09 | Disposition: A | Payer: PRIVATE HEALTH INSURANCE | Source: Ambulatory Visit | Attending: Family | Admitting: Family

## 2024-04-09 ENCOUNTER — Ambulatory Visit
Admission: RE | Admit: 2024-04-09 | Discharge: 2024-04-09 | Disposition: A | Discharge status: Home / Self Care | Payer: PRIVATE HEALTH INSURANCE | Attending: Family | Admitting: Family

## 2024-04-09 VITALS — BP 126/80 | HR 80 | Temp 99.0°F | Ht 63.0 in | Wt 137.0 lb

## 2024-04-09 DIAGNOSIS — M5441 Lumbago with sciatica, right side: Secondary | ICD-10-CM

## 2024-04-09 DIAGNOSIS — G8929 Other chronic pain: Secondary | ICD-10-CM

## 2024-04-09 DIAGNOSIS — M791 Myalgia, unspecified site: Secondary | ICD-10-CM | POA: Insufficient documentation

## 2024-04-09 DIAGNOSIS — Z1211 Encounter for screening for malignant neoplasm of colon: Secondary | ICD-10-CM

## 2024-04-09 DIAGNOSIS — R140 Abdominal distension (gaseous): Secondary | ICD-10-CM | POA: Insufficient documentation

## 2024-04-09 DIAGNOSIS — D649 Anemia, unspecified: Secondary | ICD-10-CM

## 2024-04-09 LAB — CBC WITH DIFFERENTIAL/PLATELET
Basophils Absolute: 0 K/uL (ref 0.0–0.1)
Basophils Relative: 0.4 % (ref 0.0–3.0)
Eosinophils Absolute: 0.1 K/uL (ref 0.0–0.7)
Eosinophils Relative: 2 % (ref 0.0–5.0)
HCT: 30.7 % — ABNORMAL LOW (ref 36.0–46.0)
Hemoglobin: 10.5 g/dL — ABNORMAL LOW (ref 12.0–15.0)
Lymphocytes Relative: 33.6 % (ref 12.0–46.0)
Lymphs Abs: 1.9 K/uL (ref 0.7–4.0)
MCHC: 34.3 g/dL (ref 30.0–36.0)
MCV: 88.2 fl (ref 78.0–100.0)
Monocytes Absolute: 0.3 K/uL (ref 0.1–1.0)
Monocytes Relative: 4.9 % (ref 3.0–12.0)
Neutro Abs: 3.3 K/uL (ref 1.4–7.7)
Neutrophils Relative %: 59.1 % (ref 43.0–77.0)
Platelets: 385 K/uL (ref 150.0–400.0)
RBC: 3.48 Mil/uL — ABNORMAL LOW (ref 3.87–5.11)
RDW: 12.4 % (ref 11.5–15.5)
WBC: 5.6 K/uL (ref 4.0–10.5)

## 2024-04-09 LAB — COMPREHENSIVE METABOLIC PANEL WITH GFR
ALT: 10 U/L (ref 0–35)
AST: 11 U/L (ref 0–37)
Albumin: 4.4 g/dL (ref 3.5–5.2)
Alkaline Phosphatase: 56 U/L (ref 39–117)
BUN: 24 mg/dL — ABNORMAL HIGH (ref 6–23)
CO2: 28 meq/L (ref 19–32)
Calcium: 9.8 mg/dL (ref 8.4–10.5)
Chloride: 105 meq/L (ref 96–112)
Creatinine, Ser: 0.75 mg/dL (ref 0.40–1.20)
GFR: 86.97 mL/min (ref >60.00)
Glucose, Bld: 90 mg/dL (ref 70–99)
Potassium: 4 meq/L (ref 3.5–5.1)
Sodium: 141 meq/L (ref 135–145)
Total Bilirubin: 0.5 mg/dL (ref 0.2–1.2)
Total Protein: 7 g/dL (ref 6.0–8.3)

## 2024-04-09 LAB — POCT URINALYSIS DIPSTICK
Glucose, UA: NEGATIVE
Nitrite, UA: NEGATIVE
Protein, UA: POSITIVE — AB
Spec Grav, UA: 1.025 (ref 1.010–1.025)
Urobilinogen, UA: 1 U/dL
pH, UA: 5.5 (ref 5.0–8.0)

## 2024-04-09 LAB — TSH: TSH: 1.95 u[IU]/mL (ref 0.35–5.50)

## 2024-04-09 NOTE — Progress Notes (Signed)
 Acute Office Visit  Subjective:     Patient ID: Anna [name], female    DOB: 09/04/1965, 58 y.o.   MRN: [id number]  Chief Complaint  Patient presents with  . Acute Visit    Decreased appetite on Thursday ate 6 bites more than usual got really bloated for 11-12 hours then starting having left side back pain. Sunday started having left groin pain and left arm soreness. Dripping sweat Friday through Sunday when in pain.    HPI Patient is in todaywith multiple concerns today.  Reports began at Thanksgiving and she had a decrease in appetite, ate more than she usually does and began to experience some bloating.  Also reports having left lower back pain that radiated down into her left groin that has since resolved.  Noted at the time, the pain was 8 out of 10.  Today she has no pain.  She is concerned about a kidney stone.  Denies seeing any blood.  Also reports dripping sweat on Friday night into Saturday is unsure of the rationale.  Last bowel movement was this morning and defecates regularly.  Does work a job where she uses her arms to do strenuous activities.  Has tried ice and heat, and ibuprofen that has helped her symptoms.    Review of Systems  Constitutional:  Positive for diaphoresis.  HENT: Negative.    Cardiovascular: Negative.   Gastrointestinal:  Negative for blood in stool, constipation, heartburn, nausea and vomiting.       Abdominal bloating  Musculoskeletal:  Positive for back pain.  Neurological: Negative.   Psychiatric/Behavioral: Negative.    All other systems reviewed and are negative.  Past Medical History:  Diagnosis Date  . Allergy   . Anemia   . Anxiety   . Arthritis of low back   . Bilateral low back pain with right-sided sciatica   . Biliary colic   . Coronary artery disease 04/04/2022   a.) cCTA 04/04/2022: Ca2+ 1487 (99th percentile age/sex/race matched control) --> ctFFR suggested no significant stenosis.  . Depression   . Diastolic dysfunction  05/17/2022   a.) TTE 05/17/2022: EF 60-65%, no RWMAs, GLS -20.9%, PASP 42.2, triv MR, G1DD  . Family history of adverse reaction to anesthesia    a.) delayed emergence in second degree relative (maternal niece)  . Heart murmur   . History of tobacco use   . Hyperlipidemia   . Insomnia    a.) takes trazodone  PRN  . Palpitations   . Primary hypertension 09/26/2022  . PSVT (paroxysmal supraventricular tachycardia)   . Seizure disorder [facility])    very rare and when it happens pt states she loses her vision short term-last episode around 2022  . Statin intolerance   . Tobacco abuse 07/18/2016    Social History   Socioeconomic History  . Marital status: Divorced    Spouse name: Not on file  . Number of children: 2  . Years of education: 31  . Highest education level: Not on file  Occupational History    Employer: [employer]  Tobacco Use  . Smoking status: Every Day    Current packs/day: 0.50    Average packs/day: 0.5 packs/day for 39.9 years (20.0 ttl pk-yrs)    Types: Cigarettes    Start date: 05/09/1984  . Smokeless tobacco: Never  Vaping Use  . Vaping status: Never Used  Substance and Sexual Activity  . Alcohol use: Not Currently  . Drug use: No  . Sexual activity:  Not Currently    Partners: Male    Birth control/protection: Surgical    Comment: ablation, tubal  Other Topics Concern  . Not on file  Social History Narrative   Lives alone   Two grown daughters   Significant friend - 1 1/2 years   Loves to walk   Social Drivers of Health   Financial Resource Strain: Low Risk  (03/13/2023)   Overall Financial Resource Strain (CARDIA)   . Difficulty of Paying Living Expenses: Not hard at all  Food Insecurity: No Food Insecurity (03/13/2023)   Hunger Vital Sign   . Worried About Programme Researcher, Broadcasting/film/video in the Last Year: Never true   . Ran Out of Food in the Last Year: Never true  Transportation Needs: No Transportation Needs (03/13/2023)   PRAPARE -  Transportation   . Lack of Transportation (Medical): No   . Lack of Transportation (Non-Medical): No  Physical Activity: Sufficiently Active (03/13/2023)   Exercise Vital Sign   . Days of Exercise per Week: 6 days   . Minutes of Exercise per Session: 100 min  Stress: No Stress Concern Present (03/13/2023)   [employer] of Occupational Health - Occupational Stress Questionnaire   . Feeling of Stress : Not at all  Social Connections: Moderately Isolated (03/13/2023)   Social Connection and Isolation Panel   . Frequency of Communication with Friends and Family: More than three times a week   . Frequency of Social Gatherings with Friends and Family: Once a week   . Attends Religious Services: Never   . Active Member of Clubs or Organizations: No   . Attends Banker Meetings: Never   . Marital Status: Living with partner  Intimate Partner Violence: Not At Risk (03/13/2023)   Humiliation, Afraid, Rape, and Kick questionnaire   . Fear of Current or Ex-Partner: No   . Emotionally Abused: No   . Physically Abused: No   . Sexually Abused: No    Past Surgical History:  Procedure Laterality Date  . CATARACT EXTRACTION, BILATERAL    . CHOLECYSTECTOMY N/A 01/13/2023   Procedure: LAPAROSCOPIC CHOLECYSTECTOMY WITH INTRAOPERATIVE CHOLANGIOGRAM;  Surgeon: Kallie Manuelita BROCKS, MD;  Location: [facility];  Service: General;  Laterality: N/A;  . COLONOSCOPY  01/10/2012   Procedure: COLONOSCOPY;  Surgeon: Oneil DELENA Budge, MD;  Location: [facility];  Service: Gastroenterology;  Laterality: N/A;  . CYST EXCISION Right    hand  . DILATION AND CURETTAGE OF UTERUS     Ablation  . FOOT SURGERY Left   . HAND SURGERY    . LUMBAR FUSION    . TONSILLECTOMY      Family History  Problem Relation Age of Onset  . Hypertension Mother   . Diabetes Mother   . Heart attack Mother 75  . Heart disease Mother   . Kidney disease Mother   . Hyperlipidemia Mother   . Non-Hodgkin's lymphoma Father         t cell  . Arthritis Father   . Hyperlipidemia Father   . Thyroid  disease Daughter   . Breast cancer Cousin        in 60's    Allergies  Allergen Reactions  . Macrobid  [Nitrofurantoin ] Other (See Comments)    dizziness  . Buspirone Other (See Comments)    Brain 'buzzes'  . Lexapro  [Escitalopram ] Other (See Comments)    Decreased sexual libido  . Penicillins Other (See Comments)    Has patient had a PCN reaction causing immediate  rash, facial/tongue/throat swelling, SOB or lightheadedness with hypotension: unknown Has patient had a PCN reaction causing severe rash involving mucus membranes or skin necrosis: unknown Has patient had a PCN reaction that required hospitalization: No Has patient had a PCN reaction occurring within the last 10 years: No If all of the above answers are NO, then may proceed with Cephalosporin use.   . Statins Other (See Comments)    Body aches  . Wellbutrin [Bupropion] Itching    Current Outpatient Medications on File Prior to Visit  Medication Sig Dispense Refill  . aspirin  EC 81 MG tablet Take 1 tablet (81 mg total) by mouth daily. Swallow whole. 90 tablet 3  . cholecalciferol (VITAMIN D3) 25 MCG (1000 UNIT) tablet Take 2,000 Units by mouth daily.    . ezetimibe  (ZETIA ) 10 MG tablet TAKE ONE TABLET BY MOUTH ONE TIME DAILY 30 tablet 5  . fenofibrate  160 MG tablet Take 1 tablet (160 mg total) by mouth every evening. 90 tablet 3  . loratadine (CLARITIN) 10 MG tablet Take 10 mg by mouth daily as needed for allergies.    . losartan  (COZAAR ) 25 MG tablet TAKE ONE TABLET BY MOUTH EVERY MORNING 90 tablet 1  . Multiple Vitamins-Minerals (OCUVITE PRESERVISION PO) Take 1 tablet by mouth in the morning and at bedtime.    . Omega-3 Fatty Acids (FISH OIL PO) Take 1 capsule by mouth daily.    . omeprazole  (PRILOSEC) 20 MG capsule Take 1 capsule (20 mg total) by mouth daily. 30 capsule 3  . ondansetron  (ZOFRAN ) 4 MG tablet Take 1 tablet (4 mg total) by mouth every  8 (eight) hours as needed for nausea or vomiting. 20 tablet 0  . traZODone  (DESYREL ) 100 MG tablet TAKE 1 TABLET BY MOUTH EVERYDAY AT BEDTIME 90 tablet 3  . Turmeric (QC TUMERIC COMPLEX PO) Take 1 tablet by mouth daily.     No current facility-administered medications on file prior to visit.    BP 126/80   Pulse 80   Temp 99 F (37.2 C)   Ht 5' 3 (1.6 m)   Wt 137 lb (62.1 kg)   SpO2 97%   BMI 24.27 kg/m chart      Objective:    BP 126/80   Pulse 80   Temp 99 F (37.2 C)   Ht 5' 3 (1.6 m)   Wt 137 lb (62.1 kg)   SpO2 97%   BMI 24.27 kg/m    Physical Exam Vitals reviewed.  Constitutional:      Appearance: Normal appearance. She is normal weight.  HENT:     Mouth/Throat:     Mouth: Mucous membranes are moist.  Cardiovascular:     Rate and Rhythm: Normal rate and regular rhythm.  Pulmonary:     Effort: Pulmonary effort is normal.     Breath sounds: Normal breath sounds.  Abdominal:     General: Abdomen is flat. Bowel sounds are normal.     Palpations: Abdomen is soft.  Musculoskeletal:        General: Normal range of motion.     Cervical back: Normal range of motion and neck supple.  Skin:    General: Skin is warm and dry.  Neurological:     General: No focal deficit present.     Mental Status: She is alert and oriented to person, place, and time. Mental status is at baseline.  Psychiatric:        Mood and Affect: Mood normal.  Behavior: Behavior normal.        Thought Content: Thought content normal.        Judgment: Judgment normal.    No results found for any visits on 04/09/24.      Assessment & Plan:   Problem List Items Addressed This Visit     Bilateral low back pain with right-sided sciatica   Relevant Orders   POC Urinalysis Dipstick   DG Abd 1 View   CBC w/Diff   TSH   Comp Met (CMET)   Other Visit Diagnoses       Myalgia    -  Primary   Relevant Orders   POC Urinalysis Dipstick   DG Abd 1 View   CBC w/Diff   TSH    Comp Met (CMET)     Abdominal bloating       Relevant Orders   POC Urinalysis Dipstick   DG Abd 1 View   CBC w/Diff   TSH   Comp Met (CMET)      Symptomology today is complex and not defining.  Labs obtained will notify patient pending results.  Will also obtain a UA and a KUB.  Call the office if symptoms worsen or persist.  Recheck as scheduled or sooner as needed.   No follow-ups on file.  Ennifer Harston B Jaydalynn Olivero, FNP

## 2024-04-10 NOTE — Telephone Encounter (Signed)
 Copied from CRM [id number]. Topic: Clinical - Lab/Test Results >> Apr 09, 2024  4:58 PM China J wrote: Reason for CRM: Patient is returning a call from [place] in regards to  Anna [name]'s message. I was able to relay and she confirmed that she has had a colonoscopy in the last 3-4 years but feels as though she may be due for another.  *She wanted to let [name] know that she does not pick up some calls that come in due to spam but will immediately call back after a voice message is left.*

## 2024-04-17 ENCOUNTER — Other Ambulatory Visit: Payer: Self-pay

## 2024-04-17 ENCOUNTER — Encounter: Payer: Self-pay | Admitting: Emergency Medicine

## 2024-04-17 ENCOUNTER — Encounter
Admission: EM | Disposition: A | Discharge status: Home / Self Care | Payer: Self-pay | Source: Home / Self Care | Attending: Hospitalist

## 2024-04-17 ENCOUNTER — Inpatient Hospital Stay
Admission: EM | Admit: 2024-04-17 | Discharge: 2024-04-19 | DRG: 660 | Disposition: A | Discharge status: Home / Self Care | Payer: PRIVATE HEALTH INSURANCE | Attending: Hospitalist | Admitting: Hospitalist

## 2024-04-17 ENCOUNTER — Observation Stay: Payer: PRIVATE HEALTH INSURANCE

## 2024-04-17 ENCOUNTER — Observation Stay: Payer: PRIVATE HEALTH INSURANCE | Admitting: General Practice

## 2024-04-17 ENCOUNTER — Ambulatory Visit: Payer: Self-pay

## 2024-04-17 ENCOUNTER — Emergency Department: Payer: PRIVATE HEALTH INSURANCE

## 2024-04-17 DIAGNOSIS — N139 Obstructive and reflux uropathy, unspecified: Secondary | ICD-10-CM | POA: Diagnosis present

## 2024-04-17 DIAGNOSIS — N12 Tubulo-interstitial nephritis, not specified as acute or chronic: Secondary | ICD-10-CM | POA: Diagnosis present

## 2024-04-17 DIAGNOSIS — R10A2 Flank pain, left side: Secondary | ICD-10-CM

## 2024-04-17 DIAGNOSIS — J449 Chronic obstructive pulmonary disease, unspecified: Secondary | ICD-10-CM | POA: Diagnosis present

## 2024-04-17 DIAGNOSIS — K219 Gastro-esophageal reflux disease without esophagitis: Secondary | ICD-10-CM | POA: Diagnosis present

## 2024-04-17 DIAGNOSIS — R000 Tachycardia, unspecified: Secondary | ICD-10-CM | POA: Diagnosis not present

## 2024-04-17 DIAGNOSIS — F17210 Nicotine dependence, cigarettes, uncomplicated: Secondary | ICD-10-CM | POA: Diagnosis present

## 2024-04-17 DIAGNOSIS — N390 Urinary tract infection, site not specified: Secondary | ICD-10-CM | POA: Insufficient documentation

## 2024-04-17 DIAGNOSIS — Z8249 Family history of ischemic heart disease and other diseases of the circulatory system: Secondary | ICD-10-CM

## 2024-04-17 DIAGNOSIS — Z7982 Long term (current) use of aspirin: Secondary | ICD-10-CM

## 2024-04-17 DIAGNOSIS — I959 Hypotension, unspecified: Secondary | ICD-10-CM | POA: Diagnosis not present

## 2024-04-17 DIAGNOSIS — N201 Calculus of ureter: Secondary | ICD-10-CM

## 2024-04-17 DIAGNOSIS — E785 Hyperlipidemia, unspecified: Secondary | ICD-10-CM | POA: Diagnosis present

## 2024-04-17 DIAGNOSIS — I2510 Atherosclerotic heart disease of native coronary artery without angina pectoris: Secondary | ICD-10-CM | POA: Diagnosis present

## 2024-04-17 DIAGNOSIS — N200 Calculus of kidney: Secondary | ICD-10-CM

## 2024-04-17 DIAGNOSIS — Z833 Family history of diabetes mellitus: Secondary | ICD-10-CM

## 2024-04-17 DIAGNOSIS — I10 Essential (primary) hypertension: Secondary | ICD-10-CM | POA: Diagnosis present

## 2024-04-17 DIAGNOSIS — B961 Klebsiella pneumoniae [K. pneumoniae] as the cause of diseases classified elsewhere: Secondary | ICD-10-CM | POA: Diagnosis present

## 2024-04-17 DIAGNOSIS — N202 Calculus of kidney with calculus of ureter: Principal | ICD-10-CM | POA: Diagnosis present

## 2024-04-17 DIAGNOSIS — G40909 Epilepsy, unspecified, not intractable, without status epilepticus: Secondary | ICD-10-CM | POA: Diagnosis present

## 2024-04-17 HISTORY — PX: CYSTOSCOPY WITH STENT PLACEMENT: SHX5790

## 2024-04-17 LAB — COMPREHENSIVE METABOLIC PANEL WITH GFR
ALT: 17 U/L (ref 0–44)
AST: 21 U/L (ref 15–41)
Albumin: 3.9 g/dL (ref 3.5–5.0)
Alkaline Phosphatase: 62 U/L (ref 38–126)
Anion gap: 15 (ref 5–15)
BUN: 23 mg/dL — ABNORMAL HIGH (ref 6–20)
CO2: 21 mmol/L — ABNORMAL LOW (ref 22–32)
Calcium: 9.6 mg/dL (ref 8.9–10.3)
Chloride: 106 mmol/L (ref 98–111)
Creatinine, Ser: 0.92 mg/dL (ref 0.44–1.00)
GFR, Estimated: >60 mL/min (ref >60)
Glucose, Bld: 97 mg/dL (ref 70–99)
Potassium: 3.7 mmol/L (ref 3.5–5.1)
Sodium: 141 mmol/L (ref 135–145)
Total Bilirubin: 0.4 mg/dL (ref 0.0–1.2)
Total Protein: 7.2 g/dL (ref 6.5–8.1)

## 2024-04-17 LAB — CBC
HCT: 29.4 % — ABNORMAL LOW (ref 36.0–46.0)
Hemoglobin: 9.8 g/dL — ABNORMAL LOW (ref 12.0–15.0)
MCH: 29.3 pg (ref 26.0–34.0)
MCHC: 33.3 g/dL (ref 30.0–36.0)
MCV: 88 fL (ref 80.0–100.0)
Platelets: 296 K/uL (ref 150–400)
RBC: 3.34 MIL/uL — ABNORMAL LOW (ref 3.87–5.11)
RDW: 11.9 % (ref 11.5–15.5)
WBC: 8.3 K/uL (ref 4.0–10.5)
nRBC: 0 % (ref 0.0–0.2)

## 2024-04-17 LAB — URINALYSIS, ROUTINE W REFLEX MICROSCOPIC
Bilirubin Urine: NEGATIVE
Glucose, UA: NEGATIVE mg/dL
Ketones, ur: NEGATIVE mg/dL
Nitrite: POSITIVE — AB
Protein, ur: 30 mg/dL — AB
RBC / HPF: >50 RBC/hpf (ref 0–5)
Specific Gravity, Urine: 1.014 (ref 1.005–1.030)
WBC, UA: >50 WBC/hpf (ref 0–5)
pH: 5 (ref 5.0–8.0)

## 2024-04-17 LAB — LACTIC ACID, PLASMA: Lactic Acid, Venous: 0.7 mmol/L (ref 0.5–1.9)

## 2024-04-17 LAB — LIPASE, BLOOD: Lipase: 14 U/L (ref 11–51)

## 2024-04-17 SURGERY — CYSTOSCOPY, WITH STENT INSERTION
Anesthesia: General | Site: Ureter | Laterality: Left

## 2024-04-17 MED ORDER — TRAZODONE HCL 100 MG PO TABS
100.0000 mg | ORAL_TABLET | Freq: Every day | ORAL | Status: DC
  Filled 2024-04-17 (×2): qty 1

## 2024-04-17 MED ORDER — MIDAZOLAM HCL 2 MG/2ML IJ SOLN
INTRAMUSCULAR | Status: AC
  Filled 2024-04-17: qty 2

## 2024-04-17 MED ORDER — OXYCODONE HCL 5 MG/5ML PO SOLN: 5.0000 mg | Freq: Once | ORAL | Status: DC | PRN

## 2024-04-17 MED ORDER — FENTANYL CITRATE (PF) 100 MCG/2ML IJ SOLN
INTRAMUSCULAR | Status: DC | PRN
  Administered 2024-04-17: 50 ug via INTRAVENOUS

## 2024-04-17 MED ORDER — ACETAMINOPHEN 500 MG PO TABS
ORAL_TABLET | ORAL | Status: AC
  Filled 2024-04-17: qty 2

## 2024-04-17 MED ORDER — ASPIRIN 81 MG PO TBEC
81.0000 mg | DELAYED_RELEASE_TABLET | Freq: Every day | ORAL | Status: DC
  Administered 2024-04-18 – 2024-04-19 (×2): 81 mg via ORAL
  Filled 2024-04-17 (×2): qty 1

## 2024-04-17 MED ORDER — LIDOCAINE HCL (PF) 2 % IJ SOLN
INTRAMUSCULAR | Status: DC | PRN
  Administered 2024-04-17: 40 mg via INTRADERMAL

## 2024-04-17 MED ORDER — IOHEXOL 300 MG/ML  SOLN
100.0000 mL | Freq: Once | INTRAMUSCULAR | Status: AC | PRN
  Administered 2024-04-17: 100 mL via INTRAVENOUS

## 2024-04-17 MED ORDER — SODIUM CHLORIDE 0.9 % IV SOLN: INTRAVENOUS | Status: DC

## 2024-04-17 MED ORDER — ACETAMINOPHEN 650 MG RE SUPP: 650.0000 mg | Freq: Four times a day (QID) | RECTAL | Status: DC | PRN

## 2024-04-17 MED ORDER — KETOROLAC TROMETHAMINE 15 MG/ML IJ SOLN
15.0000 mg | Freq: Once | INTRAMUSCULAR | Status: AC
  Administered 2024-04-17: 15 mg via INTRAVENOUS
  Filled 2024-04-17: qty 1

## 2024-04-17 MED ORDER — SODIUM CHLORIDE 0.9 % IR SOLN
Status: DC | PRN
  Administered 2024-04-17: 3000 mL

## 2024-04-17 MED ORDER — ACETAMINOPHEN 500 MG PO TABS
1000.0000 mg | ORAL_TABLET | Freq: Once | ORAL | Status: AC
  Administered 2024-04-17: 1000 mg via ORAL

## 2024-04-17 MED ORDER — ACETAMINOPHEN 325 MG PO TABS
650.0000 mg | ORAL_TABLET | Freq: Four times a day (QID) | ORAL | Status: DC | PRN
  Administered 2024-04-18 (×3): 650 mg via ORAL
  Filled 2024-04-17 (×3): qty 2

## 2024-04-17 MED ORDER — SODIUM CHLORIDE 0.9 % IV SOLN
1.0000 g | Freq: Once | INTRAVENOUS | Status: AC
  Administered 2024-04-17: 1 g via INTRAVENOUS
  Filled 2024-04-17: qty 10

## 2024-04-17 MED ORDER — PROPOFOL 10 MG/ML IV BOLUS
INTRAVENOUS | Status: AC
  Filled 2024-04-17: qty 20

## 2024-04-17 MED ORDER — FENOFIBRATE 160 MG PO TABS
160.0000 mg | ORAL_TABLET | Freq: Every evening | ORAL | Status: DC
  Administered 2024-04-18: 160 mg via ORAL
  Filled 2024-04-17 (×4): qty 1

## 2024-04-17 MED ORDER — FENTANYL CITRATE (PF) 100 MCG/2ML IJ SOLN
INTRAMUSCULAR | Status: AC
  Filled 2024-04-17: qty 2

## 2024-04-17 MED ORDER — SODIUM CHLORIDE 0.9 % IV BOLUS
1000.0000 mL | Freq: Once | INTRAVENOUS | Status: AC
  Administered 2024-04-17: 1000 mL via INTRAVENOUS

## 2024-04-17 MED ORDER — HYDROMORPHONE HCL 1 MG/ML IJ SOLN
0.5000 mg | INTRAMUSCULAR | Status: DC | PRN
  Administered 2024-04-17: 0.5 mg via INTRAVENOUS
  Filled 2024-04-17: qty 0.5

## 2024-04-17 MED ORDER — OXYCODONE HCL 5 MG PO TABS: 5.0000 mg | ORAL_TABLET | Freq: Once | ORAL | Status: DC | PRN

## 2024-04-17 MED ORDER — ONDANSETRON HCL 4 MG/2ML IJ SOLN: 4.0000 mg | Freq: Four times a day (QID) | INTRAMUSCULAR | Status: DC | PRN

## 2024-04-17 MED ORDER — FENTANYL CITRATE (PF) 100 MCG/2ML IJ SOLN: 25.0000 ug | INTRAMUSCULAR | Status: DC | PRN

## 2024-04-17 MED ORDER — DEXMEDETOMIDINE HCL IN NACL 80 MCG/20ML IV SOLN
INTRAVENOUS | Status: DC | PRN
  Administered 2024-04-17: 4 ug via INTRAVENOUS
  Administered 2024-04-17: 8 ug via INTRAVENOUS

## 2024-04-17 MED ORDER — CHLORHEXIDINE GLUCONATE 0.12 % MT SOLN: 15.0000 mL | Freq: Once | OROMUCOSAL | Status: DC

## 2024-04-17 MED ORDER — ORAL CARE MOUTH RINSE: 15.0000 mL | Freq: Once | OROMUCOSAL | Status: DC

## 2024-04-17 MED ORDER — LOSARTAN POTASSIUM 25 MG PO TABS
25.0000 mg | ORAL_TABLET | Freq: Every morning | ORAL | Status: DC
  Filled 2024-04-17 (×2): qty 1

## 2024-04-17 MED ORDER — ONDANSETRON HCL 4 MG PO TABS
4.0000 mg | ORAL_TABLET | Freq: Four times a day (QID) | ORAL | Status: DC | PRN
  Administered 2024-04-18: 4 mg via ORAL
  Filled 2024-04-17: qty 1

## 2024-04-17 MED ORDER — EZETIMIBE 10 MG PO TABS
10.0000 mg | ORAL_TABLET | Freq: Every day | ORAL | Status: DC
  Administered 2024-04-17 – 2024-04-18 (×2): 10 mg via ORAL
  Filled 2024-04-17 (×2): qty 1

## 2024-04-17 MED ORDER — MIDAZOLAM HCL (PF) 2 MG/2ML IJ SOLN
INTRAMUSCULAR | Status: DC | PRN
  Administered 2024-04-17: 2 mg via INTRAVENOUS

## 2024-04-17 MED ORDER — LORATADINE 10 MG PO TABS: 10.0000 mg | ORAL_TABLET | Freq: Every day | ORAL | Status: DC | PRN

## 2024-04-17 MED ORDER — PROPOFOL 10 MG/ML IV BOLUS
INTRAVENOUS | Status: DC | PRN
  Administered 2024-04-17: 20 mg via INTRAVENOUS
  Administered 2024-04-17: 30 mg via INTRAVENOUS
  Administered 2024-04-17: 50 mg via INTRAVENOUS

## 2024-04-17 MED ORDER — PANTOPRAZOLE SODIUM 40 MG PO TBEC
40.0000 mg | DELAYED_RELEASE_TABLET | Freq: Every day | ORAL | Status: DC
  Administered 2024-04-18 – 2024-04-19 (×2): 40 mg via ORAL
  Filled 2024-04-17 (×2): qty 1

## 2024-04-17 MED ORDER — ONDANSETRON HCL 4 MG/2ML IJ SOLN
4.0000 mg | Freq: Once | INTRAMUSCULAR | Status: AC
  Administered 2024-04-17: 4 mg via INTRAVENOUS
  Filled 2024-04-17: qty 2

## 2024-04-17 MED ORDER — SODIUM CHLORIDE 0.9 % IV SOLN
2.0000 g | INTRAVENOUS | Status: DC
  Administered 2024-04-18 – 2024-04-19 (×2): 2 g via INTRAVENOUS
  Filled 2024-04-17 (×3): qty 20

## 2024-04-17 SURGICAL SUPPLY — 16 items
BAG DRAIN SIEMENS DORNER NS (MISCELLANEOUS) ×1 IMPLANT
BRUSH SCRUB EZ 4% CHG (MISCELLANEOUS) IMPLANT
CATH URETL OPEN 5X70 (CATHETERS) ×1 IMPLANT
GLOVE BIOGEL PI IND STRL 7.5 (GLOVE) ×2 IMPLANT
GOWN STRL REUS W/ TWL LRG LVL3 (GOWN DISPOSABLE) ×1 IMPLANT
GOWN STRL REUS W/ TWL XL LVL3 (GOWN DISPOSABLE) ×1 IMPLANT
GUIDEWIRE STR DUAL SENSOR (WIRE) ×1 IMPLANT
KIT TURNOVER CYSTO (KITS) ×1 IMPLANT
PACK CYSTO AR (MISCELLANEOUS) ×1 IMPLANT
SET CYSTO IRRIGATION (SET/KITS/TRAYS/PACK) ×1 IMPLANT
SOL .9 NS 3000ML IRR UROMATIC (IV SOLUTION) ×1 IMPLANT
SOLN STERILE WATER 500 ML (IV SOLUTION) ×1 IMPLANT
STENT URET 6FRX24 CONTOUR (STENTS) IMPLANT
STENT URET 6FRX26 CONTOUR (STENTS) IMPLANT
SURGILUBE 2OZ TUBE FLIPTOP (MISCELLANEOUS) ×1 IMPLANT
SYRINGE TOOMEY IRRIG 70ML (MISCELLANEOUS) IMPLANT

## 2024-04-17 NOTE — Anesthesia Procedure Notes (Signed)
 Date/Time: 04/17/2024 4:05 PM  Performed by: Lacretia Camelia NOVAK, CRNAPre-anesthesia Checklist: Patient identified, Emergency Drugs available, Suction available and Patient being monitored Patient Re-evaluated:Patient Re-evaluated prior to induction Oxygen Delivery Method: Simple face mask

## 2024-04-17 NOTE — H&P (Signed)
 History and Physical    Anna [name] FMW:[id number] DOB: 06-18-1965 DOA: 04/17/2024  PCP: Corwin Antu, FNP (Confirm with patient/family/NH records and if not entered, this has to be entered at [facility] point of entry) Patient coming from: Home  I have personally briefly reviewed patient's old medical records in [facility]  Chief Complaint: Bradycardia, burning with urination  HPI: Anna [name] is a 58 y.o. female with medical history significant of HTN, HLD, anxiety/depression, GERD, presented with worsening of left-sided abdominal pain and dysuria.  Symptoms started 3 days ago when patient started to have sharp like left-sided abdominal pain radiating to the left groin, followed by burning sensation when urinate, denies any fever chills no nauseous vomiting or diarrhea.  No history of kidney stones.  ED Course: Temperature 99.1 heart rate 92 blood pressure 120/80 O2 saturation 95% on room air.  CT abdomen pelvis showed obstructing left distal ureter stone with mild left-sided hydro ureter.  Blood work showed WBC 8.3 hemoglobin 9.8 BUN 23 creatinine 2.9.  UA showed 3+ WBC, 3+ RBC positive nitrite.  Patient was started on ceftriaxone  in the ED.  Review of Systems: As per HPI otherwise 14 point review of systems negative.    Past Medical History:  Diagnosis Date   Allergy    Anemia    Anxiety    Arthritis of low back    Bilateral low back pain with right-sided sciatica    Biliary colic    Coronary artery disease 04/04/2022   a.) cCTA 04/04/2022: Ca2+ 1487 (99th percentile age/sex/race matched control) --> ctFFR suggested no significant stenosis.   Depression    Diastolic dysfunction 05/17/2022   a.) TTE 05/17/2022: EF 60-65%, no RWMAs, GLS -20.9%, PASP 42.2, triv MR, G1DD   Family history of adverse reaction to anesthesia    a.) delayed emergence in second degree relative (maternal niece)   Heart murmur    History of tobacco use    Hyperlipidemia    Insomnia    a.) takes  trazodone  PRN   Palpitations    Primary hypertension 09/26/2022   PSVT (paroxysmal supraventricular tachycardia)    Seizure disorder (HCC)    very rare and when it happens pt states she loses her vision short term-last episode around 2022   Statin intolerance    Tobacco abuse 07/18/2016    Past Surgical History:  Procedure Laterality Date   CATARACT EXTRACTION, BILATERAL     CHOLECYSTECTOMY N/A 01/13/2023   Procedure: LAPAROSCOPIC CHOLECYSTECTOMY WITH INTRAOPERATIVE CHOLANGIOGRAM;  Surgeon: Kallie Manuelita BROCKS, MD;  Location: [facility];  Service: General;  Laterality: N/A;   COLONOSCOPY  01/10/2012   Procedure: COLONOSCOPY;  Surgeon: Oneil DELENA Budge, MD;  Location: [facility] SUITE;  Service: Gastroenterology;  Laterality: N/A;   CYST EXCISION Right    hand   DILATION AND CURETTAGE OF UTERUS     Ablation   FOOT SURGERY Left    HAND SURGERY     LUMBAR FUSION     TONSILLECTOMY       reports that she has been smoking cigarettes. She started smoking about 39 years ago. She has a 20 pack-year smoking history. She has never used smokeless tobacco. She reports that she does not currently use alcohol. She reports that she does not use drugs.  Allergies  Allergen Reactions   Macrobid  [Nitrofurantoin ] Other (See Comments)    dizziness   Buspirone Other (See Comments)    Brain 'buzzes'   Lexapro  [Escitalopram ] Other (See Comments)  Decreased sexual libido   Penicillins Other (See Comments)    Has patient had a PCN reaction causing immediate rash, facial/tongue/throat swelling, SOB or lightheadedness with hypotension: unknown Has patient had a PCN reaction causing severe rash involving mucus membranes or skin necrosis: unknown Has patient had a PCN reaction that required hospitalization: No Has patient had a PCN reaction occurring within the last 10 years: No If all of the above answers are NO, then may proceed with Cephalosporin use.    Statins Other (See Comments)    Body aches    Wellbutrin [Bupropion] Itching    Family History  Problem Relation Age of Onset   Hypertension Mother    Diabetes Mother    Heart attack Mother 51   Heart disease Mother    Kidney disease Mother    Hyperlipidemia Mother    Non-Hodgkin's lymphoma Father        t cell   Arthritis Father    Hyperlipidemia Father    Thyroid  disease Daughter    Breast cancer Cousin        in 77's     Prior to Admission medications   Medication Sig Start Date End Date Taking? Authorizing Provider  aspirin  EC 81 MG tablet Take 1 tablet (81 mg total) by mouth daily. Swallow whole. 04/05/22  Yes Agbor-Etang, Redell, MD  cholecalciferol (VITAMIN D3) 25 MCG (1000 UNIT) tablet Take 2,000 Units by mouth daily.   Yes [provider]  ezetimibe  (ZETIA ) 10 MG tablet TAKE ONE TABLET BY MOUTH ONE TIME DAILY 03/29/24  Yes Darliss Redell, MD  fenofibrate  160 MG tablet Take 1 tablet (160 mg total) by mouth every evening. 06/08/23  Yes Dugal, Ginger, FNP  loratadine (CLARITIN) 10 MG tablet Take 10 mg by mouth daily as needed for allergies.   Yes [provider]  losartan  (COZAAR ) 25 MG tablet TAKE ONE TABLET BY MOUTH EVERY MORNING 02/02/24  Yes Dugal, Tabitha, FNP  Multiple Vitamins-Minerals (OCUVITE PRESERVISION PO) Take 1 tablet by mouth in the morning and at bedtime.   Yes [provider]  Omega-3 Fatty Acids (FISH OIL PO) Take 1 capsule by mouth daily.   Yes [provider]  omeprazole  (PRILOSEC) 20 MG capsule Take 1 capsule (20 mg total) by mouth daily. 02/19/24  Yes Dugal, Tabitha, FNP  ondansetron  (ZOFRAN ) 4 MG tablet Take 1 tablet (4 mg total) by mouth every 8 (eight) hours as needed for nausea or vomiting. 06/08/23  Yes Dugal, Tabitha, FNP  traZODone  (DESYREL ) 100 MG tablet TAKE 1 TABLET BY MOUTH EVERYDAY AT BEDTIME 08/28/23  Yes Dugal, Tabitha, FNP  Turmeric (QC TUMERIC COMPLEX PO) Take 1 tablet by mouth daily.   Yes [provider]    Physical Exam: Vitals:    04/17/24 1052 04/17/24 1054 04/17/24 1402 04/17/24 1535  BP: 123/87  105/70 108/73  Pulse: (!) 126  92 89  Resp: 18  16 18   Temp: 99 F (37.2 C)  98.5 F (36.9 C) 99.1 F (37.3 C)  TempSrc: Oral   Temporal  SpO2: 96%  95% 98%  Weight:  62.1 kg  60.3 kg  Height:  5' 3 (1.6 m)  5' 3.5 (1.613 m)    Constitutional: NAD, calm, comfortable Vitals:   04/17/24 1052 04/17/24 1054 04/17/24 1402 04/17/24 1535  BP: 123/87  105/70 108/73  Pulse: (!) 126  92 89  Resp: 18  16 18   Temp: 99 F (37.2 C)  98.5 F (36.9 C) 99.1 F (37.3 C)  TempSrc: Oral   Temporal  SpO2: 96%  95% 98%  Weight:  62.1 kg  60.3 kg  Height:  5' 3 (1.6 m)  5' 3.5 (1.613 m)   Eyes: PERRL, lids and conjunctivae normal ENMT: Mucous membranes are moist. Posterior pharynx clear of any exudate or lesions.Normal dentition.  Neck: normal, supple, no masses, no thyromegaly Respiratory: clear to auscultation bilaterally, no wheezing, no crackles. Normal respiratory effort. No accessory muscle use.  Cardiovascular: Regular rate and rhythm, no murmurs / rubs / gallops. No extremity edema. 2+ pedal pulses. No carotid bruits.  Abdomen: no tenderness, no masses palpated. No hepatosplenomegaly. Bowel sounds positive.  Musculoskeletal: no clubbing / cyanosis. No joint deformity upper and lower extremities. Good ROM, no contractures. Normal muscle tone.  Skin: no rashes, lesions, ulcers. No induration Neurologic: CN 2-12 grossly intact. Sensation intact, DTR normal. Strength 5/5 in all 4.  Psychiatric: Normal judgment and insight. Alert and oriented x 3. Normal mood.     Labs on Admission: I have personally reviewed following labs and imaging studies  CBC: Recent Labs  Lab 04/17/24 1057  WBC 8.3  HGB 9.8*  HCT 29.4*  MCV 88.0  PLT 296   Basic Metabolic Panel: Recent Labs  Lab 04/17/24 1057  NA 141  K 3.7  CL 106  CO2 21*  GLUCOSE 97  BUN 23*  CREATININE 0.92  CALCIUM 9.6   GFR: Estimated Creatinine  Clearance: 55.7 mL/min (by C-G formula based on SCr of 0.92 mg/dL). Liver Function Tests: Recent Labs  Lab 04/17/24 1057  AST 21  ALT 17  ALKPHOS 62  BILITOT 0.4  PROT 7.2  ALBUMIN 3.9   Recent Labs  Lab 04/17/24 1057  LIPASE 14   No results for input(s): AMMONIA in the last 168 hours. Coagulation Profile: No results for input(s): INR, PROTIME in the last 168 hours. Cardiac Enzymes: No results for input(s): CKTOTAL, CKMB, CKMBINDEX, TROPONINI in the last 168 hours. BNP (last 3 results) No results for input(s): PROBNP in the last 8760 hours. HbA1C: No results for input(s): HGBA1C in the last 72 hours. CBG: No results for input(s): GLUCAP in the last 168 hours. Lipid Profile: No results for input(s): CHOL, HDL, LDLCALC, TRIG, CHOLHDL, LDLDIRECT in the last 72 hours. Thyroid  Function Tests: No results for input(s): TSH, T4TOTAL, FREET4, T3FREE, THYROIDAB in the last 72 hours. Anemia Panel: No results for input(s): VITAMINB12, FOLATE, FERRITIN, TIBC, IRON, RETICCTPCT in the last 72 hours. Urine analysis:    Component Value Date/Time   COLORURINE YELLOW (A) 04/17/2024 1105   APPEARANCEUR CLOUDY (A) 04/17/2024 1105   LABSPEC 1.014 04/17/2024 1105   PHURINE 5.0 04/17/2024 1105   GLUCOSEU NEGATIVE 04/17/2024 1105   HGBUR MODERATE (A) 04/17/2024 1105   BILIRUBINUR NEGATIVE 04/17/2024 1105   BILIRUBINUR small 04/09/2024 0858   KETONESUR NEGATIVE 04/17/2024 1105   PROTEINUR 30 (A) 04/17/2024 1105   UROBILINOGEN 1.0 04/09/2024 0858   NITRITE POSITIVE (A) 04/17/2024 1105   LEUKOCYTESUR LARGE (A) 04/17/2024 1105    Radiological Exams on Admission: DG OR UROLOGY CYSTO IMAGE ([facility] ONLY) Result Date: 04/17/2024 There is no interpretation for this exam.  This order is for images obtained during a surgical procedure.  Please See Surgeries Tab for more information regarding the procedure.   CT ABDOMEN PELVIS W  CONTRAST Result Date: 04/17/2024 CLINICAL DATA:  Abdominal and flank pain. EXAM: CT ABDOMEN AND PELVIS WITH CONTRAST TECHNIQUE: Multidetector CT imaging of the abdomen and pelvis was performed using the standard protocol  following bolus administration of intravenous contrast. RADIATION DOSE REDUCTION: This exam was performed according to the departmental dose-optimization program which includes automated exposure control, adjustment of the mA and/or kV according to patient size and/or use of iterative reconstruction technique. CONTRAST:  OMNIPAQUE  IOHEXOL  300 MG/ML  SOLN COMPARISON:  CT abdomen/pelvis dated 02/21/2007. FINDINGS: Lower chest: No acute abnormality. Bibasilar linear atelectasis/scarring. Hepatobiliary: 11 mm simple cyst in the left hepatic lobe and 13 mm simple cyst in the right hepatic lobe. Additional subcentimeter focal hypodensities in the right hepatic lobe are too small to definitively characterize, although statistically favored to represent small cysts or hemangiomas. Status post cholecystectomy. No biliary dilatation. Pancreas: Unremarkable. No pancreatic ductal dilatation or surrounding inflammatory changes. Spleen: Normal in size without focal abnormality. Adrenals/Urinary Tract: Adrenal glands are unremarkable. 2-3 mm calculus in the distal left ureter near the level of the left ureterovesicular junction with minimal left hydroureter. No frank hydronephrosis. Nonobstructive punctate calculus in the upper pole of the left kidney and the lower pole of the right kidney. Bladder is unremarkable. Stomach/Bowel: Stomach is within normal limits. Appendix appears normal. No evidence of obstruction or inflammatory changes. Vascular/Lymphatic: Abdominal aorta is normal in caliber with atherosclerotic calcification. No enlarged abdominal or pelvic lymph nodes. Reproductive: Uterus and bilateral adnexa are unremarkable. Other: No abdominopelvic ascites. No intraperitoneal free air. No abdominal  wall hernia. Musculoskeletal: No acute osseous abnormality. No suspicious osseous lesion. Status post posterior fusion at L4-L5. IMPRESSION: 1. 2-3 mm calculus in the distal left ureter near the level of the left ureterovesicular junction with minimal left hydroureter. No frank hydronephrosis. 2. Nonobstructive punctate bilateral renal calculi. 3.  Aortic Atherosclerosis (ICD10-I70.0). Electronically Signed   By: Harrietta Sherry M.D.   On: 04/17/2024 13:46    EKG: None  Assessment/Plan Principal Problem:   Obstructive uropathy Active Problems:   Complicated UTI (urinary tract infection)  (please populate well all problems here in Problem List. (For example, if patient is on BP meds at home and you resume or decide to hold them, it is a problem that needs to be her. Same for CAD, COPD, HLD and so on)  Left-sided obstructive uropathy - Secondary to obstructing left-sided distal ureteral stone - Cystoscope and left-sided ureter stenting this afternoon.  Complicated UTI with microscopic hematuria - Continue ceftriaxone  - Urine culture sent  HTN - Resume losartan  tomorrow  HLD - Continue Zetia  and statin  GERD - Continue PPI  DVT prophylaxis: SCD Code Status: Full code Family Communication: Husband at bedside Disposition Plan: Expect less than 2 midnight hospital [name] Consults called: Urology Admission status: MedSurg observation   Cort ONEIDA Mana MD [facility] Pager [phone]  04/17/2024, 3:58 PM

## 2024-04-17 NOTE — ED Provider Notes (Signed)
 [facility] Provider Note    Event Date/Time   First MD Initiated Contact with Patient 04/17/24 1136     (approximate)   History   Abdominal Pain and Flank Pain   HPI  Anna [name] is a 58 y.o. female who presents today for evaluation of burning with urination, urinary urgency, and left-sided flank pain.  She reports that her symptoms were intermittent ever since Thanksgiving, though got worse over the last couple of days.  She also reports that she has been feeling very cold since yesterday and feels like she cannot get warm.  She took ibuprofen early this morning.  She has not had any vomiting though she does endorse feeling nauseated.  She saw her outpatient provider on 04/09/2024.  She had a KUB which was normal.  They also recommended that she have a colonoscopy, and referral was sent to GI.  Patient Active Problem List   Diagnosis Date Noted   Complicated UTI (urinary tract infection) 04/17/2024   Obstructive uropathy 04/17/2024   Breast pain, left 02/19/2024   Dense breast 02/19/2024   Pain of right lower extremity 02/19/2024   Epigastric pain 02/19/2024   Dilated cbd, acquired 01/04/2023   Calculus of gallbladder without cholecystitis without obstruction 01/03/2023   Myalgia due to statin 09/26/2022   Statin intolerance 09/26/2022   History of tobacco use 09/26/2022   Primary hypertension 09/26/2022   Palpitations 09/26/2022   Coronary artery disease without angina pectoris 09/26/2022   Sleep disorder 09/26/2022   Mixed hyperlipidemia 09/26/2022   Combined forms of age-related cataract of left eye 09/03/2019   Combined forms of age-related cataract of right eye 08/27/2019   GAD (generalized anxiety disorder) 07/18/2016   Depression 04/05/2014   Bilateral low back pain with right-sided sciatica 04/05/2014          Physical Exam   Triage Vital Signs: ED Triage Vitals  Encounter Vitals Group     BP 04/17/24 1052 123/87     Girls  Systolic BP Percentile --      Girls Diastolic BP Percentile --      Boys Systolic BP Percentile --      Boys Diastolic BP Percentile --      Pulse Rate 04/17/24 1052 (!) 126     Resp 04/17/24 1052 18     Temp 04/17/24 1052 99 F (37.2 C)     Temp Source 04/17/24 1052 Oral     SpO2 04/17/24 1052 96 %     Weight 04/17/24 1054 137 lb (62.1 kg)     Height 04/17/24 1054 5' 3 (1.6 m)     Head Circumference --      Peak Flow --      Pain Score 04/17/24 1054 10     Pain Loc --      Pain Education --      Exclude from Growth Chart --     Most recent vital signs: Vitals:   04/17/24 1052 04/17/24 1402  BP: 123/87 105/70  Pulse: (!) 126 92  Resp: 18 16  Temp: 99 F (37.2 C) 98.5 F (36.9 C)  SpO2: 96% 95%    Physical Exam Vitals and nursing note reviewed.  Constitutional:      General: Awake and alert.  Moderately uncomfortable appearing    Appearance: Normal appearance. The patient is normal weight.  HENT:     Head: Normocephalic and atraumatic.     Mouth: Mucous membranes are moist.  Eyes:  General: PERRL. Normal EOMs        Right eye: No discharge.        Left eye: No discharge.     Conjunctiva/sclera: Conjunctivae normal.  Cardiovascular:     Rate and Rhythm: Tachycardic rate and regular rhythm.     Pulses: Normal pulses.  Pulmonary:     Effort: Pulmonary effort is normal. No respiratory distress.     Breath sounds: Normal breath sounds.  Abdominal:     Abdomen is soft. There is no abdominal tenderness. No rebound or guarding. No distention.  Left-sided CVA tenderness Musculoskeletal:        General: No swelling. Normal range of motion.     Cervical back: Normal range of motion and neck supple.  Skin:    General: Skin is warm and dry.     Capillary Refill: Capillary refill takes less than 2 seconds.     Findings: No rash.  Neurological:     Mental Status: The patient is awake and alert.      ED Results / Procedures / Treatments   Labs (all labs ordered  are listed, but only abnormal results are displayed) Labs Reviewed  COMPREHENSIVE METABOLIC PANEL WITH GFR - Abnormal; Notable for the following components:      Result Value   CO2 21 (*)    BUN 23 (*)    All other components within normal limits  CBC - Abnormal; Notable for the following components:   RBC 3.34 (*)    Hemoglobin 9.8 (*)    HCT 29.4 (*)    All other components within normal limits  URINALYSIS, ROUTINE W REFLEX MICROSCOPIC - Abnormal; Notable for the following components:   Color, Urine YELLOW (*)    APPearance CLOUDY (*)    Hgb urine dipstick MODERATE (*)    Protein, ur 30 (*)    Nitrite POSITIVE (*)    Leukocytes,Ua LARGE (*)    Bacteria, UA FEW (*)    All other components within normal limits  URINE CULTURE  LIPASE, BLOOD  LACTIC ACID, PLASMA  HIV ANTIBODY (ROUTINE TESTING W REFLEX)     EKG     RADIOLOGY I independently reviewed and interpreted imaging and agree with radiologists findings.     PROCEDURES:  Critical Care performed:   Procedures   MEDICATIONS ORDERED IN ED: Medications  aspirin  EC tablet 81 mg (has no administration in time range)  ezetimibe  (ZETIA ) tablet 10 mg (has no administration in time range)  fenofibrate  tablet 160 mg (has no administration in time range)  losartan  (COZAAR ) tablet 25 mg (has no administration in time range)  traZODone  (DESYREL ) tablet 100 mg (has no administration in time range)  pantoprazole (PROTONIX) EC tablet 40 mg (has no administration in time range)  loratadine (CLARITIN) tablet 10 mg (has no administration in time range)  cefTRIAXone  (ROCEPHIN ) 2 g in sodium chloride  0.9 % 100 mL IVPB (has no administration in time range)  acetaminophen  (TYLENOL ) tablet 650 mg (has no administration in time range)    Or  acetaminophen  (TYLENOL ) suppository 650 mg (has no administration in time range)  HYDROmorphone  (DILAUDID ) injection 0.5-1 mg (has no administration in time range)  ondansetron  (ZOFRAN )  tablet 4 mg (has no administration in time range)    Or  ondansetron  (ZOFRAN ) injection 4 mg (has no administration in time range)  sodium chloride  0.9 % bolus 1,000 mL (0 mLs Intravenous Stopped 04/17/24 1307)  ketorolac  (TORADOL ) 15 MG/ML injection 15 mg (15 mg Intravenous Given  04/17/24 1204)  ondansetron  (ZOFRAN ) injection 4 mg (4 mg Intravenous Given 04/17/24 1204)  cefTRIAXone  (ROCEPHIN ) 1 g in sodium chloride  0.9 % 100 mL IVPB (0 g Intravenous Stopped 04/17/24 1307)  iohexol  (OMNIPAQUE ) 300 MG/ML solution 100 mL (100 mLs Intravenous Contrast Given 04/17/24 1252)     IMPRESSION / MDM / ASSESSMENT AND PLAN / ED COURSE  I reviewed the triage vital signs and the nursing notes.   Differential diagnosis includes, but is not limited to, pyelonephritis, infected stone, diverticulitis, renal abscess.  Patient is awake and alert, tachycardic on arrival to 126 and normotensive, slight elevation in temperature to 99 F.  Further workup is indicated.  Labs were obtained in triage which reveal no leukocytosis, normal creatinine.  Lactate also obtained in triage is negative.  Urinalysis obtained does reveal findings consistent with urinary tract infection with leukocytes and nitrites with greater than 50 RBCs and greater than 50 WBCs.  She was started on Rocephin , and also given Toradol  and fluids.  Subsequent CAT scan obtained reveals a 2 to 3 mm distal ureteral stone with mild hydroureter without marked hydronephrosis.  I consulted urology.  Patient was seen by Dr. Francisca who plans on stenting this afternoon.  He has requested hospitalist for admission.  I consulted the hospitalist and patient was excepted by Dr. Laurita.  Discussed all findings and recommendations with patient and her husband who understand and agree with plan.     Patient's presentation is most consistent with acute presentation with potential threat to life or bodily function.   Clinical Course as of 04/17/24 1505  Wed Apr 17, 2024  1352 Urology paged [JP]  1358 Discussed with Dr. Francisca who will come to evaluate the patient.  Plan for stenting, has asked for hospitalist to admit [JP]    Clinical Course User Index [JP] Flower Franko E, PA-C     FINAL CLINICAL IMPRESSION(S) / ED DIAGNOSES   Final diagnoses:  Pyelonephritis  Kidney stone  Left flank pain  Left ureteral stone     Rx / DC Orders   ED Discharge Orders     None        Note:  This document was prepared using Dragon voice recognition software and may include unintentional dictation errors.   Kamoni Gentles E, PA-C 04/17/24 1505    Viviann Pastor, MD 04/18/24 1320

## 2024-04-17 NOTE — Anesthesia Postprocedure Evaluation (Signed)
 Anesthesia Post Note  Patient: Anna [name]  Procedure(s) Performed: CYSTOSCOPY, WITH STENT INSERTION (Left: Ureter)  Patient location during evaluation: PACU Anesthesia Type: General Level of consciousness: awake and alert Pain management: pain level controlled Vital Signs Assessment: post-procedure vital signs reviewed and stable Respiratory status: spontaneous breathing, nonlabored ventilation, respiratory function stable and patient connected to nasal cannula oxygen Cardiovascular status: blood pressure returned to baseline and stable Postop Assessment: no apparent nausea or vomiting Anesthetic complications: no   No notable events documented.   Last Vitals:  Vitals:   04/17/24 1914 04/17/24 1928  BP: (!) 81/47 (!) 90/57  Pulse: 88 88  Resp: 16   Temp: 37.8 C   SpO2: 95%     Last Pain:  Vitals:   04/17/24 1910  TempSrc:   PainSc: 0-No pain                 Debby Mines

## 2024-04-17 NOTE — Consult Note (Signed)
 Urology Consult   I have been asked to see the patient by Jenna Pggi, PA , for evaluation and management of left ureteral stone and UTI.  Chief Complaint: Left groin pain, fever/chills  HPI:  Anna [name] is a 58 y.o. female who reports intermittent left groin pain for at least 2 weeks, with new fever/chills over the last 24 to 48 hours.  Evaluation in the ER with CT scan showed a 3 mm left distal ureteral stone, urinalysis was grossly concerning for infection, and urology was consulted.  She denies any prior history of kidney stones.  No chest pain or shortness of breath.  PMH: Past Medical History:  Diagnosis Date   Allergy    Anemia    Anxiety    Arthritis of low back    Bilateral low back pain with right-sided sciatica    Biliary colic    Coronary artery disease 04/04/2022   a.) cCTA 04/04/2022: Ca2+ 1487 (99th percentile age/sex/race matched control) --> ctFFR suggested no significant stenosis.   Depression    Diastolic dysfunction 05/17/2022   a.) TTE 05/17/2022: EF 60-65%, no RWMAs, GLS -20.9%, PASP 42.2, triv MR, G1DD   Family history of adverse reaction to anesthesia    a.) delayed emergence in second degree relative (maternal niece)   Heart murmur    History of tobacco use    Hyperlipidemia    Insomnia    a.) takes trazodone  PRN   Palpitations    Primary hypertension 09/26/2022   PSVT (paroxysmal supraventricular tachycardia)    Seizure disorder (HCC)    very rare and when it happens pt states she loses her vision short term-last episode around 2022   Statin intolerance    Tobacco abuse 07/18/2016    Surgical History: Past Surgical History:  Procedure Laterality Date   CATARACT EXTRACTION, BILATERAL     CHOLECYSTECTOMY N/A 01/13/2023   Procedure: LAPAROSCOPIC CHOLECYSTECTOMY WITH INTRAOPERATIVE CHOLANGIOGRAM;  Surgeon: Kallie Manuelita BROCKS, MD;  Location: [facility];  Service: General;  Laterality: N/A;   COLONOSCOPY  01/10/2012   Procedure: COLONOSCOPY;   Surgeon: Oneil DELENA Budge, MD;  Location: [facility];  Service: Gastroenterology;  Laterality: N/A;   CYST EXCISION Right    hand   DILATION AND CURETTAGE OF UTERUS     Ablation   FOOT SURGERY Left    HAND SURGERY     LUMBAR FUSION     TONSILLECTOMY        Allergies:  Allergies  Allergen Reactions   Macrobid  [Nitrofurantoin ] Other (See Comments)    dizziness   Buspirone Other (See Comments)    Brain 'buzzes'   Lexapro  [Escitalopram ] Other (See Comments)    Decreased sexual libido   Penicillins Other (See Comments)    Has patient had a PCN reaction causing immediate rash, facial/tongue/throat swelling, SOB or lightheadedness with hypotension: unknown Has patient had a PCN reaction causing severe rash involving mucus membranes or skin necrosis: unknown Has patient had a PCN reaction that required hospitalization: No Has patient had a PCN reaction occurring within the last 10 years: No If all of the above answers are NO, then may proceed with Cephalosporin use.    Statins Other (See Comments)    Body aches   Wellbutrin [Bupropion] Itching    Family History: Family History  Problem Relation Age of Onset   Hypertension Mother    Diabetes Mother    Heart attack Mother 41   Heart disease Mother    Kidney  disease Mother    Hyperlipidemia Mother    Non-Hodgkin's lymphoma Father        t cell   Arthritis Father    Hyperlipidemia Father    Thyroid  disease Daughter    Breast cancer Cousin        in 46's    Social History:  reports that she has been smoking cigarettes. She started smoking about 39 years ago. She has a 20 pack-year smoking history. She has never used smokeless tobacco. She reports that she does not currently use alcohol. She reports that she does not use drugs.  ROS: Negative aside from those stated in the HPI.  Physical Exam: BP 105/70   Pulse 92   Temp 98.5 F (36.9 C)   Resp 16   Ht 5' 3 (1.6 m)   Wt 62.1 kg   SpO2 95%   BMI 24.27 kg/m     Constitutional:  Alert and oriented, No acute distress. Cardiovascular: Regular rate and rhythm Respiratory: Clear to auscultation bilaterally GI: Abdomen is soft, nontender, nondistended, no abdominal masses   Laboratory Data: Reviewed in epic, normal lactate, no leukocytosis, normal renal function, urinalysis grossly infected  Pertinent Imaging: I have personally reviewed the CT scan showing a 3 mm left distal ureteral stone.  Assessment & Plan:   58 year old female with 2 to 3 weeks of left-sided groin pain, new fever/chills, urinalysis concerning for infection, and fever to 99 in the ER.  Clinical picture consistent with 3 mm left distal ureteral stone with UTI.  We discussed the need for drainage in the setting of an infected and obstructed system.  A ureteral stent is a small plastic tube that is placed cystoscopically with one end in the kidney and the other end in the bladder that allows the infection from the kidney to drain, and relieves pain from the obstructing stone.  We discussed the risks at length including bleeding, infection, sepsis, death, ureteral injury, and stent related symptoms including urgency/frequency/dysuria/flank pain/gross hematuria.  There is a low, but not 0, risk of inability to pass the ureteral stent alongside the stone from below which would require percutaneous nephrostomy tube by interventional radiology.  Finally, we discussed possible prolonged hospitalization and recovery, possible temporary Foley catheter placement, and 10 to 14-day course of antibiotics.  We reviewed the need for a follow-up procedure for definitive management of their stone when the infection has been treated in 2 to 3 weeks with ureteroscopy/laser lithotripsy.   Recommendations:  -Agree with hospitalist admission and antibiotics -Plan for cystoscopy and left ureteral stent placement this afternoon  Redell JAYSON Burnet, MD   [facility] [address] [place], [place] [address] [phone]

## 2024-04-17 NOTE — Anesthesia Preprocedure Evaluation (Signed)
 Anesthesia Evaluation  Patient identified by MRN, date of birth, ID band Patient awake    Reviewed: Allergy & Precautions, H&P , NPO status , Patient's Chart, lab work & pertinent test results  History of Anesthesia Complications (+) PROLONGED EMERGENCE, Family history of anesthesia reaction and history of anesthetic complications  Airway Mallampati: II  TM Distance: >3 FB Neck ROM: Full    Dental  (+) Dental Advisory Given, Poor Dentition, Loose, Missing   Pulmonary neg pulmonary ROS, Current Smoker and Patient abstained from smoking.   Pulmonary exam normal breath sounds clear to auscultation       Cardiovascular Exercise Tolerance: Good hypertension, Pt. on medications + CAD and +CHF  Normal cardiovascular exam+ dysrhythmias Supra Ventricular Tachycardia + Valvular Problems/Murmurs  Rhythm:Regular Rate:Normal  04-Dec-2022 04:35:27 [facility]-ED ROUTINE RECORD [birth year] (57 yr) Female Caucasian Test pwi:zephjdumpr pain Vent. rate 68 BPM PR interval 156 ms QRS duration 92 ms QT/QTcB 410/435 ms P-R-T axes 72 -32 36 Sinus rhythm with occasional Premature ventricular complexes Possible Left atrial enlargement Left axis deviation Incomplete right bundle branch block Septal infarct , age undetermined Abnormal ECG When compared with ECG of 23-Oct-2022 02:07, Premature ventricular complexes are now Present Septal infarct is now Present Confirmed by UNCONFIRMED, DOCTOR ([id number]), editor Con, Madelin [phone]) on 12/05/2022 2:49:11 PM   Neuro/Psych Seizures - (very rare and when it happens pt states she loses her vision short term-last episode around 2022), Well Controlled,  PSYCHIATRIC DISORDERS Anxiety Depression     Neuromuscular disease (right sided sciatica)  negative psych ROS   GI/Hepatic negative GI ROS, Neg liver ROS,,,  Endo/Other  negative endocrine ROS    Renal/GU      Musculoskeletal    Abdominal   Peds  Hematology negative hematology ROS (+) Blood dyscrasia, anemia   Anesthesia Other Findings Past Medical History: No date: Allergy No date: Anemia No date: Anxiety No date: Arthritis of low back No date: Bilateral low back pain with right-sided sciatica No date: Biliary colic 04/04/2022: Coronary artery disease     Comment:  a.) cCTA 04/04/2022: Ca2+ 1487 (99th percentile               age/sex/race matched control) --> ctFFR suggested no               significant stenosis. No date: Depression 05/17/2022: Diastolic dysfunction     Comment:  a.) TTE 05/17/2022: EF 60-65%, no RWMAs, GLS -20.9%,               PASP 42.2, triv MR, G1DD No date: Family history of adverse reaction to anesthesia     Comment:  a.) delayed emergence in second degree relative               (maternal niece) No date: Heart murmur No date: History of tobacco use No date: Hyperlipidemia No date: Insomnia     Comment:  a.) takes trazodone  PRN No date: Palpitations 09/26/2022: Primary hypertension No date: PSVT (paroxysmal supraventricular tachycardia) No date: Seizure disorder [facility])     Comment:  very rare and when it happens pt states she loses her               vision short term-last episode around 2022 No date: Statin intolerance 07/18/2016: Tobacco abuse  Past Surgical History: No date: CATARACT EXTRACTION, BILATERAL 01/13/2023: CHOLECYSTECTOMY; N/A     Comment:  Procedure: LAPAROSCOPIC CHOLECYSTECTOMY WITH  INTRAOPERATIVE CHOLANGIOGRAM;  Surgeon: Kallie Manuelita BROCKS, MD;  Location: [facility];  Service: General;  Laterality:              N/A; 01/10/2012: COLONOSCOPY     Comment:  Procedure: COLONOSCOPY;  Surgeon: Oneil DELENA Budge, MD;                Location: [facility];  Service: Gastroenterology;                Laterality: N/A; No date: CYST EXCISION; Right     Comment:  hand No date: DILATION AND CURETTAGE OF UTERUS     Comment:  Ablation No date:  FOOT SURGERY; Left No date: HAND SURGERY No date: LUMBAR FUSION No date: TONSILLECTOMY  BMI    Body Mass Index: 23.19 kg/m      Reproductive/Obstetrics negative OB ROS                              Anesthesia Physical Anesthesia Plan  ASA: 3  Anesthesia Plan: General ETT   Post-op Pain Management:    Induction: Intravenous  PONV Risk Score and Plan: 3 and Ondansetron , Dexamethasone  and Midazolam   Airway Management Planned: Oral ETT  Additional Equipment:   Intra-op Plan:   Post-operative Plan: Extubation in OR  Informed Consent: I have reviewed the patients History and Physical, chart, labs and discussed the procedure including the risks, benefits and alternatives for the proposed anesthesia with the patient or authorized representative who has indicated his/her understanding and acceptance.     Dental Advisory Given  Plan Discussed with: Anesthesiologist, CRNA and Surgeon  Anesthesia Plan Comments: (Patient consented for risks of anesthesia including but not limited to:  - adverse reactions to medications - damage to eyes, teeth, lips or other oral mucosa - nerve damage due to positioning  - sore throat or hoarseness - Damage to heart, brain, nerves, lungs, other parts of body or loss of life  Patient voiced understanding and assent.)        Anesthesia Quick Evaluation

## 2024-04-17 NOTE — Op Note (Signed)
 Date of procedure: 04/17/24  Preoperative diagnosis:  Left ureteral stone UTI  Postoperative diagnosis:  Same  Procedure: Cystoscopy, left ureteral stent placement  Surgeon: Redell Burnet, MD  Anesthesia: General  Complications: None  Intraoperative findings:  Normal cystoscopy, uncomplicated left ureteral stent placement  EBL: None  Specimens: None  Drains: Left 6 French by 24 cm ureteral stent  Indication: Anna [name] is a 58 y.o. patient with 3 mm left distal ureteral stone, urine concerning for infection, and fever who opted for stent placement with plan for delayed ureteroscopy after infection treated.  After reviewing the management options for treatment, they elected to proceed with the above surgical procedure(s). We have discussed the potential benefits and risks of the procedure, side effects of the proposed treatment, the likelihood of the patient achieving the goals of the procedure, and any potential problems that might occur during the procedure or recuperation. Informed consent has been obtained.  Description of procedure:  The patient was taken to the operating room and MAC was induced. SCDs were placed for DVT prophylaxis. The patient was placed in the dorsal lithotomy position, prepped and draped in the usual sterile fashion, and preoperative antibiotics(ceftriaxone  in ER) were administered. A preoperative time-out was performed.   A 21 French rigid cystoscope was used to intubate the urethra and thorough cystoscopy was performed.  The bladder was grossly normal throughout with no suspicious lesions.  The left ureteral orifice was cannulated with a sensor wire and the wire advanced easily up to the left kidney under fluoroscopic vision.  There was retained contrast in the left kidney from earlier CT scan with contrast.  A 6 French by 24 cm ureteral stent was placed uneventfully with a curl in the upper pole as well as under direct vision in the bladder.  Cloudy  urine drained through the side ports of the stent.  The bladder was drained and this concluded the procedure  Disposition: Stable to PACU  Plan: Continue antibiotics and admit overnight to hospitalist, likely can discharge tomorrow if clinically improving Will need outpatient ureteroscopy in 2 to 4 weeks for definitive treatment of stone after infection treated  Redell Burnet, MD

## 2024-04-17 NOTE — Transfer of Care (Signed)
 Immediate Anesthesia Transfer of Care Note  Patient: Anna [name]  Procedure(s) Performed: CYSTOSCOPY, WITH STENT INSERTION (Left: Ureter)  Patient Location: PACU  Anesthesia Type:General  Level of Consciousness: awake, drowsy, and patient cooperative  Airway & Oxygen Therapy: Patient Spontanous Breathing  Post-op Assessment: Report given to RN and Post -op Vital signs reviewed and stable  Post vital signs: Reviewed and stable  Last Vitals:  Vitals Value Taken Time  BP    Temp    Pulse 91 04/17/24 16:25  Resp 16 04/17/24 16:25  SpO2 94 % 04/17/24 16:25  Vitals shown include unfiled device data.  Last Pain:  Vitals:   04/17/24 1535  TempSrc: Temporal  PainSc: 0-No pain         Complications: No notable events documented.

## 2024-04-17 NOTE — ED Triage Notes (Signed)
 Patient arrives ambulatory by POV - states thanksgiving evening began feeling bloated that would not subside. States she had labs and x-rays done through PCP and is now referred to GI. Onset of 2 am pain to left flank radiating into lower abdomen. Took motrin w/o relief.

## 2024-04-17 NOTE — Telephone Encounter (Signed)
 FYI Only or Action Required?: FYI only for provider: ED advised and patient declined due to she doesn't want to wait in the ED all day.  Patient was last seen in primary care on 04/09/2024 by Douglass Kenney NOVAK, FNP.  Called Nurse Triage reporting Back Pain and Urinary Urgency.  Symptoms began several weeks ago.  Interventions attempted: OTC medications: ibuprofen.  Symptoms are: gradually worsening.  Triage Disposition: Go to ED Now (Notify PCP)  Patient/caregiver understands and will follow disposition?: No, wishes to speak with PCP          Copied from CRM #[id number]. Topic: Clinical - Red Word Triage >> Apr 17, 2024  7:53 AM Victoria A wrote: [facility] that prompted transfer to Nurse Triage: Patient is having severe pain in rectum area and she said feels like her insides are going to fall out -had same symptoms last week. Reason for Disposition  [1] SEVERE pain (e.g., excruciating, scale 8-10) AND [2] present > 1 hour  Answer Assessment - Initial Assessment Questions Called CAL and notified staff of ED refusal.   1. LOCATION: Where does it hurt? (e.g., left, right)     Left.  2. ONSET: When did the pain start?     Thanksgiving and then this episode of pain started at  2am.  3. SEVERITY: How bad is the pain? (e.g., Scale 1-10; mild, moderate, or severe)     9/10, took 4 ibuprofen this morning. Insides feel like they're going to fall out  4. PATTERN: Does the pain come and go, or is it constant?      Constant since 2am.  5. CAUSE: What do you think is causing the pain?     She states she thought this was a kidney stone.  6. OTHER SYMPTOMS:  Do you have any other symptoms? (e.g., fever, abdomen pain, vomiting, leg weakness, burning with urination, blood in urine)     Left lower back pressure and radiates to the front/abdomen. No vomiting. She states she has felt feverish but her temperature has been normal.  Protocols used: Flank Pain-A-AH

## 2024-04-18 ENCOUNTER — Encounter: Payer: Self-pay | Admitting: Urology

## 2024-04-18 ENCOUNTER — Other Ambulatory Visit: Payer: Self-pay | Admitting: Physician Assistant

## 2024-04-18 DIAGNOSIS — N201 Calculus of ureter: Secondary | ICD-10-CM

## 2024-04-18 DIAGNOSIS — N139 Obstructive and reflux uropathy, unspecified: Secondary | ICD-10-CM | POA: Diagnosis not present

## 2024-04-18 DIAGNOSIS — N390 Urinary tract infection, site not specified: Secondary | ICD-10-CM | POA: Diagnosis not present

## 2024-04-18 LAB — BASIC METABOLIC PANEL WITH GFR
Anion gap: 10 (ref 5–15)
BUN: 18 mg/dL (ref 6–20)
CO2: 22 mmol/L (ref 22–32)
Calcium: 8.6 mg/dL — ABNORMAL LOW (ref 8.9–10.3)
Chloride: 109 mmol/L (ref 98–111)
Creatinine, Ser: 0.83 mg/dL (ref 0.44–1.00)
GFR, Estimated: >60 mL/min (ref >60)
Glucose, Bld: 125 mg/dL — ABNORMAL HIGH (ref 70–99)
Potassium: 3.4 mmol/L — ABNORMAL LOW (ref 3.5–5.1)
Sodium: 141 mmol/L (ref 135–145)

## 2024-04-18 LAB — CBC
HCT: 24.7 % — ABNORMAL LOW (ref 36.0–46.0)
Hemoglobin: 7.8 g/dL — ABNORMAL LOW (ref 12.0–15.0)
MCH: 29 pg (ref 26.0–34.0)
MCHC: 31.6 g/dL (ref 30.0–36.0)
MCV: 91.8 fL (ref 80.0–100.0)
Platelets: 195 K/uL (ref 150–400)
RBC: 2.69 MIL/uL — ABNORMAL LOW (ref 3.87–5.11)
RDW: 12.3 % (ref 11.5–15.5)
WBC: 4.3 K/uL (ref 4.0–10.5)
nRBC: 0 % (ref 0.0–0.2)

## 2024-04-18 LAB — HIV ANTIBODY (ROUTINE TESTING W REFLEX): HIV Screen 4th Generation wRfx: NONREACTIVE

## 2024-04-18 MED ORDER — POTASSIUM CHLORIDE 10 MEQ/100ML IV SOLN
10.0000 meq | INTRAVENOUS | Status: AC
  Administered 2024-04-18 (×3): 10 meq via INTRAVENOUS
  Filled 2024-04-18 (×3): qty 100

## 2024-04-18 MED ORDER — SODIUM CHLORIDE 0.9 % IV BOLUS
500.0000 mL | Freq: Once | INTRAVENOUS | Status: AC
  Administered 2024-04-18: 500 mL via INTRAVENOUS

## 2024-04-18 MED ORDER — FERROUS SULFATE 300 (60 FE) MG/5ML PO SOLN: 300.0000 mg | Freq: Every day | ORAL | Status: DC

## 2024-04-18 MED ORDER — OXYBUTYNIN CHLORIDE 5 MG PO TABS
5.0000 mg | ORAL_TABLET | Freq: Three times a day (TID) | ORAL | Status: DC | PRN
  Administered 2024-04-18 – 2024-04-19 (×3): 5 mg via ORAL
  Filled 2024-04-18 (×4): qty 1

## 2024-04-18 MED ORDER — FERROUS SULFATE 325 (65 FE) MG PO TABS
325.0000 mg | ORAL_TABLET | Freq: Every day | ORAL | Status: DC
  Administered 2024-04-19: 325 mg via ORAL
  Filled 2024-04-18: qty 1

## 2024-04-18 MED ORDER — IBUPROFEN 800 MG PO TABS
400.0000 mg | ORAL_TABLET | ORAL | Status: DC | PRN
  Administered 2024-04-18 – 2024-04-19 (×3): 400 mg via ORAL
  Filled 2024-04-18 (×3): qty 1

## 2024-04-18 MED ORDER — SODIUM CHLORIDE 0.9 % IV BOLUS
250.0000 mL | Freq: Once | INTRAVENOUS | Status: AC
  Administered 2024-04-18: 250 mL via INTRAVENOUS

## 2024-04-18 MED ADMIN — Tamsulosin HCl Cap 0.4 MG: 0.4 mg | ORAL | NDC 68084029911

## 2024-04-18 MED FILL — Tamsulosin HCl Cap 0.4 MG: 0.4000 mg | ORAL | Qty: 1 | Status: AC

## 2024-04-18 NOTE — Progress Notes (Signed)
°  Progress Note   Patient: Anna [name] FMW:[id number] DOB: 1966/02/27 DOA: 04/17/2024     0 DOS: the patient was seen and examined on 04/18/2024   Brief hospital course: HPI: Anna [name] is a 58 y.o. female with medical history significant of HTN, HLD, anxiety/depression, GERD, presented with worsening of left-sided abdominal pain and dysuria.   Symptoms started 3 days ago when patient started to have sharp like left-sided abdominal pain radiating to the left groin, followed by burning sensation when urinate, denies any fever chills no nauseous vomiting or diarrhea.  No history of kidney stones.   ED Course: Temperature 99.1 heart rate 92 blood pressure 120/80 O2 saturation 95% on room air.  CT abdomen pelvis showed obstructing left distal ureter stone with mild left-sided hydro ureter.  Blood work showed WBC 8.3 hemoglobin 9.8 BUN 23 creatinine 2.9.  UA showed 3+ WBC, 3+ RBC positive nitrite.  Assessment and Plan: Left obstructive uropathy due to left distal ureteral stone - S/p cystoscopy and left-sided ureteric stent placement 12/10  Complicated UTI with microhematuria - Continue ceftriaxone  - Follow the cultures - Patient is still showing signs of sepsis with low blood pressure and some tachycardia, neurologist advised to continue antibiotic and keep her in the hospital.   HTN - Resume losartan  tomorrow   HLD - Continue Zetia  and statin   GERD - Continue PPI       Subjective: No fever, no nausea no vomiting  Physical Exam: Vitals:   04/18/24 0608 04/18/24 0820 04/18/24 1003 04/18/24 1200  BP:  (!) 79/56 (!) 90/56 (!) 92/58  Pulse:  76 79 78  Resp:  20 20 19   Temp: 99 F (37.2 C) 98.9 F (37.2 C) 98.4 F (36.9 C) 98.6 F (37 C)  TempSrc: Axillary Axillary Axillary Axillary  SpO2:  95% 95% 96%  Weight:      Height:       Seen and examined at bedside HEENT: Supple neck Lungs: Clear to auscultation Heart: Regular S1-S2 Abdomen: Soft bowel sounds are present  no rebound tenderness Extremities: No edema  Data Reviewed:  Reviewed  Family Communication: Husband was at bedside  Disposition: Status is: Observation The patient remains OBS appropriate and will d/c before 2 midnights.  Planned Discharge Destination: Home    Time spent: 35 minutes  Author: Nena Rebel, MD 04/18/2024 2:44 PM  For on call review [email or web address].

## 2024-04-18 NOTE — Progress Notes (Signed)
 Urology Inpatient Progress Note  Subjective: She was febrile overnight, Tmax 39.4 C.  She is persistently hypotensive this morning. No a.m. labs ordered. Urine culture pending; on antibiotics as below. She is spontaneously voiding intermittently bloody urine. Today she reports frequent bladder spasms and urgency of urination.  Anti-infectives: Anti-infectives (From admission, onward)    Start     Dose/Rate Route Frequency Ordered Stop   04/18/24 1000  cefTRIAXone  (ROCEPHIN ) 2 g in sodium chloride  0.9 % 100 mL IVPB        2 g 200 mL/hr over 30 Minutes Intravenous Every 24 hours 04/17/24 1501 04/22/24 0959   04/17/24 1200  cefTRIAXone  (ROCEPHIN ) 1 g in sodium chloride  0.9 % 100 mL IVPB        1 g 200 mL/hr over 30 Minutes Intravenous  Once 04/17/24 1149 04/17/24 1307       Current Facility-Administered Medications  Medication Dose Route Frequency Provider Last Rate Last Admin   acetaminophen  (TYLENOL ) tablet 650 mg  650 mg Oral Q6H PRN Laurita Cort DASEN, MD   650 mg at 04/18/24 9194   Or   acetaminophen  (TYLENOL ) suppository 650 mg  650 mg Rectal Q6H PRN Laurita Cort DASEN, MD       aspirin  EC tablet 81 mg  81 mg Oral Daily Laurita Cort T, MD       cefTRIAXone  (ROCEPHIN ) 2 g in sodium chloride  0.9 % 100 mL IVPB  2 g Intravenous Q24H Laurita Cort T, MD       ezetimibe  (ZETIA ) tablet 10 mg  10 mg Oral Daily Laurita Cort T, MD   10 mg at 04/17/24 2118   fenofibrate  tablet 160 mg  160 mg Oral QPM Laurita Cort T, MD       HYDROmorphone  (DILAUDID ) injection 0.5-1 mg  0.5-1 mg Intravenous Q2H PRN Laurita Cort T, MD   0.5 mg at 04/17/24 2118   ibuprofen (ADVIL) tablet 400 mg  400 mg Oral Q4H PRN Mansy, Jan A, MD   400 mg at 04/18/24 0229   loratadine (CLARITIN) tablet 10 mg  10 mg Oral Daily PRN Laurita Cort DASEN, MD       losartan  (COZAAR ) tablet 25 mg  25 mg Oral q morning Laurita Cort T, MD       ondansetron  (ZOFRAN ) tablet 4 mg  4 mg Oral Q6H PRN Laurita Cort DASEN, MD       Or   ondansetron  (ZOFRAN )  injection 4 mg  4 mg Intravenous Q6H PRN Laurita Cort DASEN, MD       pantoprazole (PROTONIX) EC tablet 40 mg  40 mg Oral Daily Laurita Cort T, MD       traZODone  (DESYREL ) tablet 100 mg  100 mg Oral QHS Laurita Cort T, MD        Objective: Vital signs in last 24 hours: Temp:  [98.5 F (36.9 C)-103 F (39.4 C)] 98.9 F (37.2 C) (12/11 0820) Pulse Rate:  [76-126] 76 (12/11 0820) Resp:  [16-24] 20 (12/11 0820) BP: (79-123)/(47-87) 79/56 (12/11 0820) SpO2:  [89 %-100 %] 95 % (12/11 0820) Weight:  [60.3 kg-62.1 kg] 60.3 kg (12/10 1535)  Intake/Output from previous day: 12/10 0701 - 12/11 0700 In: 1140 [P.O.:1140] Out: 850 [Urine:850] Intake/Output this shift: No intake/output data recorded.  Physical Exam Vitals and nursing note reviewed.  Constitutional:      General: She is not in acute distress.    Appearance: She is not ill-appearing, toxic-appearing or diaphoretic.  HENT:  Head: Normocephalic and atraumatic.  Pulmonary:     Effort: Pulmonary effort is normal. No respiratory distress.  Skin:    General: Skin is warm and dry.  Neurological:     Mental Status: She is alert and oriented to person, place, and time.  Psychiatric:        Mood and Affect: Mood normal.        Behavior: Behavior normal.     Lab Results:  Recent Labs    04/17/24 1057  WBC 8.3  HGB 9.8*  HCT 29.4*  PLT 296   BMET Recent Labs    04/17/24 1057  NA 141  K 3.7  CL 106  CO2 21*  GLUCOSE 97  BUN 23*  CREATININE 0.92  CALCIUM 9.6   Studies/Results: DG OR UROLOGY CYSTO IMAGE ([facility] ONLY) Result Date: 04/17/2024 There is no interpretation for this exam.  This order is for images obtained during a surgical procedure.  Please See Surgeries Tab for more information regarding the procedure.   CT ABDOMEN PELVIS W CONTRAST Result Date: 04/17/2024 CLINICAL DATA:  Abdominal and flank pain. EXAM: CT ABDOMEN AND PELVIS WITH CONTRAST TECHNIQUE: Multidetector CT imaging of the abdomen and pelvis  was performed using the standard protocol following bolus administration of intravenous contrast. RADIATION DOSE REDUCTION: This exam was performed according to the departmental dose-optimization program which includes automated exposure control, adjustment of the mA and/or kV according to patient size and/or use of iterative reconstruction technique. CONTRAST:  OMNIPAQUE  IOHEXOL  300 MG/ML  SOLN COMPARISON:  CT abdomen/pelvis dated 02/21/2007. FINDINGS: Lower chest: No acute abnormality. Bibasilar linear atelectasis/scarring. Hepatobiliary: 11 mm simple cyst in the left hepatic lobe and 13 mm simple cyst in the right hepatic lobe. Additional subcentimeter focal hypodensities in the right hepatic lobe are too small to definitively characterize, although statistically favored to represent small cysts or hemangiomas. Status post cholecystectomy. No biliary dilatation. Pancreas: Unremarkable. No pancreatic ductal dilatation or surrounding inflammatory changes. Spleen: Normal in size without focal abnormality. Adrenals/Urinary Tract: Adrenal glands are unremarkable. 2-3 mm calculus in the distal left ureter near the level of the left ureterovesicular junction with minimal left hydroureter. No frank hydronephrosis. Nonobstructive punctate calculus in the upper pole of the left kidney and the lower pole of the right kidney. Bladder is unremarkable. Stomach/Bowel: Stomach is within normal limits. Appendix appears normal. No evidence of obstruction or inflammatory changes. Vascular/Lymphatic: Abdominal aorta is normal in caliber with atherosclerotic calcification. No enlarged abdominal or pelvic lymph nodes. Reproductive: Uterus and bilateral adnexa are unremarkable. Other: No abdominopelvic ascites. No intraperitoneal free air. No abdominal wall hernia. Musculoskeletal: No acute osseous abnormality. No suspicious osseous lesion. Status post posterior fusion at L4-L5. IMPRESSION: 1. 2-3 mm calculus in the distal left  ureter near the level of the left ureterovesicular junction with minimal left hydroureter. No frank hydronephrosis. 2. Nonobstructive punctate bilateral renal calculi. 3.  Aortic Atherosclerosis (ICD10-I70.0). Electronically Signed   By: Harrietta Sherry M.D.   On: 04/17/2024 13:46   Assessment & Plan: 58 y.o. female with PMH CAD and seizure disorder now s/p left ureteral stent placement with Dr. Francisca for management of an obstructing, infected 3 mm distal left ureteral stone.  She remains intermittently febrile on empiric antibiotics.  I am ordering a CBC and BMP for close monitoring.  She is tolerating her stent with some discomfort.  We discussed that she will require 10-14 days of culture-appropriate antibiotics to treat her infection, followed by outpatient left ureteroscopy with laser lithotripsy  and stent exchange in ~3 weeks to treat her stone. She expressed understanding.  Recommendations: -Continue empiric antibiotics and follow cultures for a total of 10-14 days of culture-appropriate therapy. -Start Flomax 0.4mg  daily and oxybutynin 5mg  every 8 hours as needed for stent discomfort (ordered). -Outpatient left URS/LL/stent exchange with Dr. Francisca in ~3 weeks; our scheduler will contact her after discharge to arrange  Lucie Hones, PA-C 04/18/2024

## 2024-04-18 NOTE — Hospital Course (Signed)
 HPI: Anna [name] is a 58 y.o. female with medical history significant of HTN, HLD, anxiety/depression, GERD, presented with worsening of left-sided abdominal pain and dysuria.   Symptoms started 3 days ago when patient started to have sharp like left-sided abdominal pain radiating to the left groin, followed by burning sensation when urinate, denies any fever chills no nauseous vomiting or diarrhea.  No history of kidney stones.   ED Course: Temperature 99.1 heart rate 92 blood pressure 120/80 O2 saturation 95% on room air.  CT abdomen pelvis showed obstructing left distal ureter stone with mild left-sided hydro ureter.  Blood work showed WBC 8.3 hemoglobin 9.8 BUN 23 creatinine 2.9.  UA showed 3+ WBC, 3+ RBC positive nitrite.  She was treated with ceftriaxone .  S/p cystoscopy and stenting.  She had a fever yesterday along with elevated leukocytosis.  Her urine grew Klebsiella pneumonia today.  She is asymptomatic.  She wants to be discharged home.  Urology advised to discharge on Bactrim .

## 2024-04-19 LAB — BASIC METABOLIC PANEL WITH GFR
Anion gap: 8 (ref 5–15)
BUN: 16 mg/dL (ref 6–20)
CO2: 21 mmol/L — ABNORMAL LOW (ref 22–32)
Calcium: 8.5 mg/dL — ABNORMAL LOW (ref 8.9–10.3)
Chloride: 112 mmol/L — ABNORMAL HIGH (ref 98–111)
Creatinine, Ser: 0.72 mg/dL (ref 0.44–1.00)
GFR, Estimated: >60 mL/min (ref >60)
Glucose, Bld: 138 mg/dL — ABNORMAL HIGH (ref 70–99)
Potassium: 3.1 mmol/L — ABNORMAL LOW (ref 3.5–5.1)
Sodium: 140 mmol/L (ref 135–145)

## 2024-04-19 MED ORDER — SULFAMETHOXAZOLE-TRIMETHOPRIM 800-160 MG PO TABS
1.0000 | ORAL_TABLET | Freq: Two times a day (BID) | ORAL | Status: DC
  Administered 2024-04-19: 1 via ORAL
  Filled 2024-04-19: qty 1

## 2024-04-19 MED ORDER — ACETAMINOPHEN 325 MG PO TABS: 650.0000 mg | ORAL_TABLET | Freq: Four times a day (QID) | ORAL | 0 refills | Status: AC | PRN

## 2024-04-19 MED ORDER — SULFAMETHOXAZOLE-TRIMETHOPRIM 800-160 MG PO TABS: 1.0000 | ORAL_TABLET | Freq: Two times a day (BID) | ORAL | 0 refills | Status: DC

## 2024-04-19 MED ORDER — OXYBUTYNIN CHLORIDE 5 MG PO TABS: 5.0000 mg | ORAL_TABLET | Freq: Three times a day (TID) | ORAL | 0 refills | Status: DC | PRN

## 2024-04-19 MED ORDER — SODIUM CHLORIDE 0.9 % IV SOLN: INTRAVENOUS | Status: DC

## 2024-04-19 MED ORDER — SULFAMETHOXAZOLE-TRIMETHOPRIM 400-80 MG/5ML IV SOLN: 320.0000 mg | Freq: Two times a day (BID) | INTRAVENOUS | Status: DC

## 2024-04-19 NOTE — Discharge Summary (Addendum)
 Physician Discharge Summary   Patient: Anna [name] MRN: [id number] DOB: 1965-06-02  Admit date:     04/17/2024  Discharge date: 04/19/2024  Discharge Physician: Nena Rebel   PCP: Corwin Antu, FNP   Recommendations at discharge:   Per urology, she won't need an office visit with us  since we discussed the plan at the bedside yesterday, just need her to answer the phone when our surgical scheduler calls her Continue Bactrim  DS 1 tablet twice daily for 10 days per urology Come to the hospital or go to the emergency room if there is any worsening fever, abdominal pain, nausea, vomiting.  Discharge Diagnoses: Principal Problem:   Obstructive uropathy Active Problems:   Complicated UTI (urinary tract infection)  Resolved Problems:   * No resolved hospital problems. *  Hospital Course: HPI: Anna [name] is a 58 y.o. female with medical history significant of HTN, HLD, anxiety/depression, GERD, presented with worsening of left-sided abdominal pain and dysuria.   Symptoms started 3 days ago when patient started to have sharp like left-sided abdominal pain radiating to the left groin, followed by burning sensation when urinate, denies any fever chills no nauseous vomiting or diarrhea.  No history of kidney stones.   ED Course: Temperature 99.1 heart rate 92 blood pressure 120/80 O2 saturation 95% on room air.  CT abdomen pelvis showed obstructing left distal ureter stone with mild left-sided hydro ureter.  Blood work showed WBC 8.3 hemoglobin 9.8 BUN 23 creatinine 2.9.  UA showed 3+ WBC, 3+ RBC positive nitrite.  She was treated with ceftriaxone .  S/p cystoscopy and stenting.  She had a fever yesterday along with elevated leukocytosis.  Her urine grew Klebsiella pneumonia today.  She is asymptomatic.  She wants to be discharged home.  Urology advised to discharge on Bactrim .  Assessment and Plan: Left obstructive uropathy due to left distal ureteral stone - S/p cystoscopy and  left-sided ureteric stent placement 12/10   Complicated UTI with microhematuria - Continued on ceftriaxone  -Cultures 12/12, no sensitivity available but shows Klebsiella pneumonia in the urine - Blood cultures are still negative. Per urology's recommendation she will be discharged on Bactrim  DS for 10 days.  Bactrim  has 87% sensitivity for Klebsiella.  HTN - Resume home dose losartan    HLD -Resume Zetia  and statin   GERD -Resume home medications  Anemia - Patient denies any hematemesis, melena. - Patient has been advised to follow-up with outpatient providers.      Pain control - [place]  Controlled Substance Reporting System database was reviewed. and patient was instructed, not to drive, operate heavy machinery, perform activities at heights, swimming or participation in water  activities or provide baby-sitting services while on Pain, Sleep and Anxiety Medications; until their outpatient Physician has advised to do so again. Also recommended to not to take more than prescribed Pain, Sleep and Anxiety Medications.  Consultants: Urology  Procedures performed: Cystoscopy and stent placement Disposition: Home Diet recommendation:  Regular diet DISCHARGE MEDICATION: Allergies as of 04/19/2024       Reactions   Macrobid  [nitrofurantoin ] Other (See Comments)   dizziness   Buspirone Other (See Comments)   Brain 'buzzes'   Lexapro  [escitalopram ] Other (See Comments)   Decreased sexual libido   Penicillins Other (See Comments)   Has patient had a PCN reaction causing immediate rash, facial/tongue/throat swelling, SOB or lightheadedness with hypotension: unknown Has patient had a PCN reaction causing severe rash involving mucus membranes or skin necrosis: unknown Has patient had a PCN reaction that  required hospitalization: No Has patient had a PCN reaction occurring within the last 10 years: No If all of the above answers are NO, then may proceed with Cephalosporin use.    Statins Other (See Comments)   Body aches   Wellbutrin [bupropion] Itching        Medication List     TAKE these medications    acetaminophen  325 MG tablet Commonly known as: TYLENOL  Take 2 tablets (650 mg total) by mouth every 6 (six) hours as needed for mild pain (pain score 1-3) or fever (or Fever >/= 101).   aspirin  EC 81 MG tablet Take 1 tablet (81 mg total) by mouth daily. Swallow whole.   cholecalciferol 25 MCG (1000 UNIT) tablet Commonly known as: VITAMIN D3 Take 2,000 Units by mouth daily.   ezetimibe  10 MG tablet Commonly known as: ZETIA  TAKE ONE TABLET BY MOUTH ONE TIME DAILY   fenofibrate  160 MG tablet Take 1 tablet (160 mg total) by mouth every evening.   FISH OIL PO Take 1 capsule by mouth daily.   loratadine 10 MG tablet Commonly known as: CLARITIN Take 10 mg by mouth daily as needed for allergies.   losartan  25 MG tablet Commonly known as: COZAAR  TAKE ONE TABLET BY MOUTH EVERY MORNING   OCUVITE PRESERVISION PO Take 1 tablet by mouth in the morning and at bedtime.   omeprazole  20 MG capsule Commonly known as: PRILOSEC Take 1 capsule (20 mg total) by mouth daily.   ondansetron  4 MG tablet Commonly known as: Zofran  Take 1 tablet (4 mg total) by mouth every 8 (eight) hours as needed for nausea or vomiting.   oxybutynin 5 MG tablet Commonly known as: DITROPAN Take 1 tablet (5 mg total) by mouth every 8 (eight) hours as needed for bladder spasms (stent discomfort).   QC TUMERIC COMPLEX PO Take 1 tablet by mouth daily.   sulfamethoxazole -trimethoprim  800-160 MG tablet Commonly known as: BACTRIM  DS Take 1 tablet by mouth every 12 (twelve) hours.   traZODone  100 MG tablet Commonly known as: DESYREL  TAKE 1 TABLET BY MOUTH EVERYDAY AT BEDTIME        Follow-up Information     Corwin Antu, FNP Follow up in 1 week(s).   Specialty: Family Medicine Why: F/u after antibiotic dose is complete Contact information: [address] [place] [address] [phone]                Discharge Exam: Fredricka Weights   04/17/24 1054 04/17/24 1535  Weight: 62.1 kg 60.3 kg   Seen and examined at bedside Exam is unremarkable No costovertebral angle tenderness  Condition at discharge: stable  The results of significant diagnostics from this hospitalization (including imaging, microbiology, ancillary and laboratory) are listed below for reference.   Imaging Studies: DG OR UROLOGY CYSTO IMAGE ([facility] ONLY) Result Date: 04/17/2024 There is no interpretation for this exam.  This order is for images obtained during a surgical procedure.  Please See Surgeries Tab for more information regarding the procedure.   CT ABDOMEN PELVIS W CONTRAST Result Date: 04/17/2024 CLINICAL DATA:  Abdominal and flank pain. EXAM: CT ABDOMEN AND PELVIS WITH CONTRAST TECHNIQUE: Multidetector CT imaging of the abdomen and pelvis was performed using the standard protocol following bolus administration of intravenous contrast. RADIATION DOSE REDUCTION: This exam was performed according to the departmental dose-optimization program which includes automated exposure control, adjustment of the mA and/or kV according to patient size and/or use of iterative reconstruction technique. CONTRAST:  OMNIPAQUE  IOHEXOL  300 MG/ML  SOLN COMPARISON:  CT abdomen/pelvis dated 02/21/2007. FINDINGS: Lower chest: No acute abnormality. Bibasilar linear atelectasis/scarring. Hepatobiliary: 11 mm simple cyst in the left hepatic lobe and 13 mm simple cyst in the right hepatic lobe. Additional subcentimeter focal hypodensities in the right hepatic lobe are too small to definitively characterize, although statistically favored to represent small cysts or hemangiomas. Status post cholecystectomy. No biliary dilatation. Pancreas: Unremarkable. No pancreatic ductal dilatation or surrounding inflammatory changes. Spleen: Normal in size without focal abnormality. Adrenals/Urinary  Tract: Adrenal glands are unremarkable. 2-3 mm calculus in the distal left ureter near the level of the left ureterovesicular junction with minimal left hydroureter. No frank hydronephrosis. Nonobstructive punctate calculus in the upper pole of the left kidney and the lower pole of the right kidney. Bladder is unremarkable. Stomach/Bowel: Stomach is within normal limits. Appendix appears normal. No evidence of obstruction or inflammatory changes. Vascular/Lymphatic: Abdominal aorta is normal in caliber with atherosclerotic calcification. No enlarged abdominal or pelvic lymph nodes. Reproductive: Uterus and bilateral adnexa are unremarkable. Other: No abdominopelvic ascites. No intraperitoneal free air. No abdominal wall hernia. Musculoskeletal: No acute osseous abnormality. No suspicious osseous lesion. Status post posterior fusion at L4-L5. IMPRESSION: 1. 2-3 mm calculus in the distal left ureter near the level of the left ureterovesicular junction with minimal left hydroureter. No frank hydronephrosis. 2. Nonobstructive punctate bilateral renal calculi. 3.  Aortic Atherosclerosis (ICD10-I70.0). Electronically Signed   By: Harrietta Sherry M.D.   On: 04/17/2024 13:46   DG Abd 1 View Result Date: 04/15/2024 CLINICAL DATA:  Abdominal bloating, low back pain EXAM: ABDOMEN - 1 VIEW COMPARISON:  None Available. FINDINGS: 2 supine frontal views of the abdomen and pelvis are obtained. No bowel obstruction or ileus. No masses or abnormal calcifications. Postsurgical changes from L4-5 discectomy and posterior fusion. No acute bony abnormalities. A bases are clear. IMPRESSION: 1. Unremarkable bowel gas pattern. Electronically Signed   By: Ozell Daring M.D.   On: 04/15/2024 22:41    Microbiology: Results for orders placed or performed during the hospital encounter of 04/17/24  Urine Culture (for pregnant, neutropenic or urologic patients or patients with an indwelling urinary catheter)     Status: Abnormal  (Preliminary result)   Collection Time: 04/17/24 11:05 AM   Specimen: Urine, Clean Catch  Result Value Ref Range Status   Specimen Description   Final    URINE, CLEAN CATCH Performed at [facility], [address] [place], [place] [address]    Special Requests   Final    NONE Performed at [facility], [address] [place], [place] [address]    Culture >=100,000 COLONIES/mL KLEBSIELLA PNEUMONIAE (A)  Final   Report Status PENDING  Incomplete    Labs: CBC: Recent Labs  Lab 04/17/24 1057 04/18/24 1305  WBC 8.3 4.3  HGB 9.8* 7.8*  HCT 29.4* 24.7*  MCV 88.0 91.8  PLT 296 195   Basic Metabolic Panel: Recent Labs  Lab 04/17/24 1057 04/18/24 1305 04/19/24 1105  NA 141 141 140  K 3.7 3.4* 3.1*  CL 106 109 112*  CO2 21* 22 21*  GLUCOSE 97 125* 138*  BUN 23* 18 16  CREATININE 0.92 0.83 0.72  CALCIUM 9.6 8.6* 8.5*   Liver Function Tests: Recent Labs  Lab 04/17/24 1057  AST 21  ALT 17  ALKPHOS 62  BILITOT 0.4  PROT 7.2  ALBUMIN 3.9   CBG: No results for input(s): GLUCAP in the last 168 hours.  Discharge time spent: greater than 30 minutes.  Signed: Nena Rebel, MD [facility] 04/19/2024

## 2024-04-20 LAB — URINE CULTURE: Culture: >=100000 — AB

## 2024-04-22 ENCOUNTER — Telehealth: Payer: Self-pay

## 2024-04-22 ENCOUNTER — Ambulatory Visit: Payer: Self-pay | Admitting: Family

## 2024-04-22 NOTE — Transitions of Care (Post Inpatient/ED Visit) (Unsigned)
° °  04/22/2024  Name: Anna [name] MRN: [id number] DOB: [birth year]  Today's TOC FU Call Status: Today's TOC FU Call Status:: Unsuccessful Call (1st Attempt) Unsuccessful Call (1st Attempt) Date: 04/22/24  Attempted to reach the patient regarding the most recent Inpatient/ED visit.  Follow Up Plan: Additional outreach attempts will be made to reach the patient to complete the Transitions of Care (Post Inpatient/ED visit) call.   Signature  Charmaine Bloodgood, LPN [facility] Advisor  l [facility] You Are. We Are. [facility] Direct Dial [phone]

## 2024-04-23 ENCOUNTER — Other Ambulatory Visit: Payer: PRIVATE HEALTH INSURANCE

## 2024-04-23 ENCOUNTER — Telehealth: Payer: Self-pay

## 2024-04-23 ENCOUNTER — Other Ambulatory Visit: Payer: Self-pay

## 2024-04-23 DIAGNOSIS — N201 Calculus of ureter: Secondary | ICD-10-CM

## 2024-04-23 NOTE — Telephone Encounter (Signed)
 Per Dr. Francisca, Patient is to be scheduled for Left Ureteroscopy with Laser Lithotripsy and Stent Exchange   Anna [name] was contacted and possible surgical dates were discussed, Monday January 12th, 2026 was agreed upon for surgery.   Patient was directed to call [phone] between 1-3pm the day before surgery to find out surgical arrival time.  Instructions were given not to eat or drink from midnight on the night before surgery and have a driver for the day of surgery. On the surgery day patient was instructed to enter through the [facility] entrance of [facility] report the Same Day Surgery desk.   Pre-Admit Testing will be in contact via phone to set up an interview with the anesthesia team to review your history and medications prior to surgery.   Reminder of this information was sent via MyChart to the patient.

## 2024-04-23 NOTE — Progress Notes (Signed)
 Surgical Physician Order Form [facility] [place]  Dr. Francisca * Scheduling expectation : ~3 weeks  *Length of Case:   *Clearance needed: no  *Anticoagulation Instructions: N/A  *Aspirin  Instructions: N/A  *Post-op visit Date/Instructions:  TBD  *Diagnosis: Left Ureteral Stone  *Procedure: left  Ureteroscopy w/laser lithotripsy & stent exchange (47643)   Additional orders: N/A  -Admit type: OUTpatient  -Anesthesia: General  -VTE Prophylaxis Standing Order SCD's       Other:   -Standing Lab Orders Per Anesthesia    Lab other: None  -Standing Test orders EKG/Chest x-ray per Anesthesia       Test other:   - Medications:  TBD per inpatient cx  -Other orders:  N/A

## 2024-04-23 NOTE — Progress Notes (Signed)
° °  [facility] Surgical Posting Form  Surgery Date: Date: 05/20/2024  Surgeon: Dr. Redell Burnet, MD  Inpt ( No  )   Outpt (Yes)   Obs ( No  )   Diagnosis: N20.1 Left Ureteral Stone  -CPT: [phone]  Surgery: Left Ureteroscopy with Laser Lithotripsy and Stent Exchange  Stop Anticoagulations: No, may continue all  Cardiac/Medical/Pulmonary Clearance needed: no  *Orders entered into EPIC  Date: 04/23/2024   *Case booked in [place]  Date: 04/23/2024  *Notified pt of Surgery: Date: 04/23/2024  PRE-OP UA & CX: no  *Placed into Prior Authorization [place] Date: 04/23/2024  Assistant/laser/rep:No

## 2024-04-24 ENCOUNTER — Ambulatory Visit: Payer: Self-pay | Admitting: Physician Assistant

## 2024-04-24 LAB — HEMOGLOBIN AND HEMATOCRIT, BLOOD
Hematocrit: 26.8 % — ABNORMAL LOW (ref 34.0–46.6)
Hemoglobin: 8.5 g/dL — ABNORMAL LOW (ref 11.1–15.9)

## 2024-04-24 NOTE — Transitions of Care (Post Inpatient/ED Visit) (Unsigned)
° °  04/24/2024  Name: Anna [name] MRN: [id number] DOB: [birth year]  {[id number]}

## 2024-04-24 NOTE — Transitions of Care (Post Inpatient/ED Visit) (Unsigned)
° °  04/24/2024  Name: Anna [name] MRN: [id number] DOB: 06/09/1965  Today's TOC FU Call Status: Today's TOC FU Call Status:: Unsuccessful Call (2nd Attempt) Unsuccessful Call (1st Attempt) Date: 04/22/24 Unsuccessful Call (2nd Attempt) Date: 04/24/24  Attempted to reach the patient regarding the most recent Inpatient/ED visit.  Follow Up Plan: Additional outreach attempts will be made to reach the patient to complete the Transitions of Care (Post Inpatient/ED visit) call.   Signature Julian Lemmings, LPN [facility] Nurse Health Advisor Direct Dial [phone]

## 2024-04-25 NOTE — Transitions of Care (Post Inpatient/ED Visit) (Signed)
° °  04/25/2024  Name: Anna [name] MRN: [id number] DOB: 1966-01-30  Today's TOC FU Call Status: Today's TOC FU Call Status:: Unsuccessful Call (3rd Attempt) Unsuccessful Call (1st Attempt) Date: 04/22/24 Unsuccessful Call (2nd Attempt) Date: 04/24/24 Unsuccessful Call (3rd Attempt) Date: 04/25/24  Attempted to reach the patient regarding the most recent Inpatient/ED visit.  Follow Up Plan: No further outreach attempts will be made at this time. We have been unable to contact the patient.  Signature Julian Lemmings, LPN [facility] Nurse Health Advisor Direct Dial [phone]

## 2024-04-26 ENCOUNTER — Telehealth: Payer: Self-pay | Admitting: Family

## 2024-04-26 NOTE — Telephone Encounter (Signed)
 Lvm for patient to return call to reschedule to 12/24 needs to speak directly with front desk if reaches contact center

## 2024-04-26 NOTE — Telephone Encounter (Signed)
 Copied from CRM [id number]. Topic: General - Other >> Apr 26, 2024 11:26 AM Zebedee SAUNDERS wrote: Reason for CRM: Pt needs to reschedule appt that she has on Mon. Dec. 22nd. Pt states she needs an appt sooner than Fri. Dec. 26th which is currently available. Please call [phone] pt to schedule sooner appt for pt.

## 2024-04-29 ENCOUNTER — Inpatient Hospital Stay: Payer: PRIVATE HEALTH INSURANCE | Admitting: Family

## 2024-05-03 ENCOUNTER — Ambulatory Visit: Payer: PRIVATE HEALTH INSURANCE | Admitting: Family

## 2024-05-03 VITALS — BP 116/70 | HR 101 | Temp 97.5°F | Ht 63.0 in | Wt 135.0 lb

## 2024-05-03 DIAGNOSIS — D649 Anemia, unspecified: Secondary | ICD-10-CM

## 2024-05-03 DIAGNOSIS — N10 Acute tubulo-interstitial nephritis: Secondary | ICD-10-CM

## 2024-05-03 LAB — URINALYSIS, ROUTINE W REFLEX MICROSCOPIC
Ketones, ur: NEGATIVE
Nitrite: NEGATIVE
Specific Gravity, Urine: >=1.03 — AB (ref 1.000–1.030)
Total Protein, Urine: >=300 — AB
Urine Glucose: NEGATIVE
Urobilinogen, UA: 0.2 (ref 0.0–1.0)
pH: 5.5 (ref 5.0–8.0)

## 2024-05-03 LAB — POCT URINE DIPSTICK
Bilirubin, UA: NEGATIVE
Glucose, UA: NEGATIVE mg/dL
Ketones, POC UA: NEGATIVE mg/dL
Nitrite, UA: NEGATIVE
POC PROTEIN,UA: 100 — AB
Spec Grav, UA: >=1.03 — AB
Urobilinogen, UA: 0.2 U/dL
pH, UA: 6

## 2024-05-03 NOTE — Progress Notes (Signed)
 "  Established Patient Office Visit  Subjective:      CC:  Chief Complaint  Patient presents with   Hospitalization Follow-up    Was admitted to [facility] from 04-17-24 to 04-19-24 for Pyelonephritis. States everyday she is getting better. Has some pressure in the bladder. Thinks that could be from the stent. Urine is a tea color. Insurance is not wanting to cover the physician who did the stent placement.  Has some things she would like to talk about today.     HPI: Anna [name] is a 58 y.o. female presenting on 05/03/2024 for Hospitalization Follow-up (Was admitted to [facility] from 04-17-24 to 04-19-24 for Pyelonephritis. States everyday she is getting better. Has some pressure in the bladder. Thinks that could be from the stent. Urine is a tea color. Insurance is not wanting to cover the physician who did the stent placement. /Has some things she would like to talk about today. ) .  Discussed the use of AI scribe software for clinical note transcription with the patient, who gave verbal consent to proceed.  History of Present Illness Anna [name] is a 58 year old female who presents for a follow-up after hospitalization for a left distal ureter stone and urinary tract infection.  She was hospitalized from December 10 to December 12 for a left distal ureter stone causing sharp left-sided abdominal pain radiating to the left groin, accompanied by dysuria and fever. A CT scan revealed an obstructing left distal ureter stone with mild left-sided hydroureter. She underwent cystoscopy and stenting, and was treated with ceftriaxone  and discharged on Bactrim  (sulfamethoxazole /trimethoprim ) for 10 days, which she completed despite experiencing severe nausea.  Her urine culture grew Klebsiella pneumoniae. Post-discharge, she resumed her home dose of losartan . She reports ongoing symptoms of tea-colored urine and pressure on the bladder, which she attributes to the stent. She has increased her water  intake  but still experiences some urine discoloration.  She has been taking over-the-counter iron supplements due to low hemoglobin levels noted during her hospitalization. She has not experienced constipation from the iron supplements and has been using a stool softener as needed.  She is frustrated with her insurance, which has denied coverage for her current urologist to remove the stent due to a low rating, and is considering alternative providers to avoid additional costs. She has not yet contacted the urologist's office or her insurance to resolve this issue.         Social history:  Relevant past medical, surgical, family and social history reviewed and updated as indicated. Interim medical history since our last visit reviewed.  Allergies and medications reviewed and updated.  DATA REVIEWED: CHART IN EPIC     ROS: Negative unless specifically indicated above in HPI.   Current Medications[1]        Objective:        BP 116/70 (BP Location: Left Arm, Patient Position: Sitting, Cuff Size: Normal)   Pulse (!) 101   Temp (!) 97.5 F (36.4 C) (Oral)   Ht 5' 3 (1.6 m)   Wt 135 lb (61.2 kg)   SpO2 97%   BMI 23.91 kg/m   Physical Exam ABDOMEN: Mild abdominal discomfort.  Wt Readings from Last 3 Encounters:  05/03/24 135 lb (61.2 kg)  04/17/24 133 lb (60.3 kg)  04/09/24 137 lb (62.1 kg)    Physical Exam Constitutional:      General: She is not in acute distress.    Appearance: Normal appearance. She is normal  weight. She is not ill-appearing, toxic-appearing or diaphoretic.  Cardiovascular:     Rate and Rhythm: Normal rate.  Pulmonary:     Effort: Pulmonary effort is normal.  Abdominal:     General: Abdomen is flat.     Tenderness: There is no abdominal tenderness. There is no right CVA tenderness or left CVA tenderness.  Neurological:     General: No focal deficit present.     Mental Status: She is alert and oriented to person, place, and time. Mental  status is at baseline.     Motor: No weakness.  Psychiatric:        Mood and Affect: Mood normal.        Behavior: Behavior normal.        Thought Content: Thought content normal.        Judgment: Judgment normal.          Results Labs Urinalysis (2024-04-17): 3+ white blood cells, 3+ red blood cells, positive nitrite Urine culture (2024-04-17): Klebsiella pneumoniae isolated  Radiology CT abdomen and pelvis (2024-04-17): Obstructing left distal ureteral stone with mild left-sided hydroureter  Assessment & Plan:   Assessment and Plan Assessment & Plan Left ureteral stone with indwelling stent, status post pyelonephritis Status post cystoscopy and stenting for obstructing left distal ureter stone with mild left-sided hydronephrosis. Urine culture grew Klebsiella pneumoniae. Completed Bactrim  course with significant nausea. Currently experiencing tea-colored urine and occasional bladder pressure, likely due to stent. No significant back pain or mucosal discharge. Insurance issues delaying stent removal by preferred urologist. - Increase water  intake to at least 64 ounces daily. - Sent urine sample for analysis to check for infection or hematuria. - Contact urology office to address insurance issues regarding stent removal. - Consider reaching out to insurance for a letter confirming the need for stent removal by the preferred urologist. - If unable to resolve insurance issues, consider alternative urologists with higher insurance ratings.  Anemia Low hemoglobin levels. Started over-the-counter iron supplementation. No significant gastrointestinal side effects reported. - Continue iron supplementation, consider taking every other day if constipation occurs. - Ordered repeat hemoglobin, hematocrit, and iron studies postoperatively.        Return if symptoms worsen or fail to improve.     Ginger Patrick, MSN, APRN, FNP-C [facility]         [1]  Current Outpatient Medications:    acetaminophen  (TYLENOL ) 325 MG tablet, Take 2 tablets (650 mg total) by mouth every 6 (six) hours as needed for mild pain (pain score 1-3) or fever (or Fever >/= 101)., Disp: 10 tablet, Rfl: 0   aspirin  EC 81 MG tablet, Take 1 tablet (81 mg total) by mouth daily. Swallow whole., Disp: 90 tablet, Rfl: 3   ezetimibe  (ZETIA ) 10 MG tablet, TAKE ONE TABLET BY MOUTH ONE TIME DAILY, Disp: 30 tablet, Rfl: 5   fenofibrate  160 MG tablet, Take 1 tablet (160 mg total) by mouth every evening., Disp: 90 tablet, Rfl: 3   ferrous sulfate  325 (65 FE) MG EC tablet, Take 325 mg by mouth daily in the afternoon., Disp: , Rfl:    loratadine  (CLARITIN ) 10 MG tablet, Take 10 mg by mouth daily as needed for allergies., Disp: , Rfl:    losartan  (COZAAR ) 25 MG tablet, TAKE ONE TABLET BY MOUTH EVERY MORNING, Disp: 90 tablet, Rfl: 1   omeprazole  (PRILOSEC) 20 MG capsule, Take 1 capsule (20 mg total) by mouth daily., Disp: 30 capsule, Rfl: 3   oxybutynin  (DITROPAN )  5 MG tablet, Take 1 tablet (5 mg total) by mouth every 8 (eight) hours as needed for bladder spasms (stent discomfort)., Disp: 10 tablet, Rfl: 0   traZODone  (DESYREL ) 100 MG tablet, TAKE 1 TABLET BY MOUTH EVERYDAY AT BEDTIME, Disp: 90 tablet, Rfl: 3   cholecalciferol (VITAMIN D3) 25 MCG (1000 UNIT) tablet, Take 2,000 Units by mouth daily. (Patient not taking: Reported on 05/03/2024), Disp: , Rfl:    Omega-3 Fatty Acids (FISH OIL PO), Take 1 capsule by mouth daily. (Patient not taking: Reported on 05/03/2024), Disp: , Rfl:    ondansetron  (ZOFRAN ) 4 MG tablet, Take 1 tablet (4 mg total) by mouth every 8 (eight) hours as needed for nausea or vomiting. (Patient not taking: Reported on 05/03/2024), Disp: 20 tablet, Rfl: 0   Turmeric (QC TUMERIC COMPLEX PO), Take 1 tablet by mouth daily. (Patient not taking: Reported on 05/03/2024), Disp: , Rfl:   "

## 2024-05-04 LAB — URINE CULTURE
MICRO NUMBER:: 17401088
Result:: NO GROWTH
SPECIMEN QUALITY:: ADEQUATE

## 2024-05-06 ENCOUNTER — Ambulatory Visit: Payer: Self-pay | Admitting: Family

## 2024-05-13 ENCOUNTER — Other Ambulatory Visit: Payer: Self-pay

## 2024-05-13 ENCOUNTER — Encounter
Admission: RE | Admit: 2024-05-13 | Discharge: 2024-05-13 | Disposition: A | Discharge status: Home / Self Care | Payer: PRIVATE HEALTH INSURANCE | Source: Ambulatory Visit | Attending: Urology | Admitting: Urology

## 2024-05-13 VITALS — Ht 63.0 in | Wt 132.0 lb

## 2024-05-13 DIAGNOSIS — I10 Essential (primary) hypertension: Secondary | ICD-10-CM

## 2024-05-13 HISTORY — DX: Personal history of urinary calculi: Z87.442

## 2024-05-13 NOTE — Patient Instructions (Addendum)
 Your procedure is scheduled on: Monday 05/20/24 Report to the Registration Desk on the 1st floor of the [facility]. To find out your arrival time, please call [phone] between 1PM - 3PM on: Friday 05/17/24 If your arrival time is 6:00 am, do not arrive before that time as the Medical Mall entrance doors do not open until 6:00 am.  REMEMBER: Instructions that are not followed completely may result in serious medical risk, up to and including death; or upon the discretion of your surgeon and anesthesiologist your surgery may need to be rescheduled.  Do not eat food or drink fluids after midnight the night before surgery.  No gum chewing or hard candies.   One week prior to surgery: Stop Anti-inflammatories (NSAIDS) such as Advil , Aleve, Ibuprofen , Motrin , Naproxen, Naprosyn and Aspirin  based products such as Excedrin, Goody's Powder, BC Powder. Stop ANY OVER THE COUNTER supplements until after surgery. cholecalciferol (VITAMIN D3) 25 MCG (1000 UNIT)  Omega-3 Fatty Acids (FISH OIL  Turmeric (QC TUMERIC COMPLEX PO)   You may however, continue to take Tylenol  if needed for pain up until the day of surgery.   Continue taking all of your other prescription medications up until the day of surgery.  ON THE DAY OF SURGERY ONLY TAKE THESE MEDICATIONS WITH SIPS OF WATER :  omeprazole  (PRILOSEC) 20 MG   No Alcohol for 24 hours before or after surgery.  No Smoking including e-cigarettes for 24 hours before surgery.  No chewable tobacco products for at least 6 hours before surgery.  No nicotine patches on the day of surgery.  Do not use any recreational drugs for at least a week (preferably 2 weeks) before your surgery.  Please be advised that the combination of cocaine and anesthesia may have negative outcomes, up to and including death. If you test positive for cocaine, your surgery will be cancelled.  On the morning of surgery brush your teeth with toothpaste and water , you may rinse  your mouth with mouthwash if you wish. Do not swallow any toothpaste or mouthwash.  Take a fresh shower the morning of your surgery.  Do not wear jewelry, make-up, hairpins, clips or nail polish.  For welded (permanent) jewelry: bracelets, anklets, waist bands, etc.  Please have this removed prior to surgery.  If it is not removed, there is a chance that hospital personnel will need to cut it off on the day of surgery.  Do not wear lotions, powders, or perfumes.   Do not shave body hair from the neck down 48 hours before surgery.  Contact lenses, hearing aids and dentures may not be worn into surgery.  Do not bring valuables to the hospital. [facility] is not responsible for any missing/lost belongings or valuables.   Notify your doctor if there is any change in your medical condition (cold, fever, infection).  Wear comfortable clothing (specific to your surgery type) to the hospital.  After surgery, you can help prevent lung complications by doing breathing exercises.  Take deep breaths and cough every 1-2 hours. Your doctor may order a device called an Incentive Spirometer to help you take deep breaths. When coughing or sneezing, hold a pillow firmly against your incision with both hands. This is called splinting. Doing this helps protect your incision. It also decreases belly discomfort.  If you are being admitted to the hospital overnight, leave your suitcase in the car. After surgery it may be brought to your room.  In case of increased patient census, it may be  necessary for you, the patient, to continue your postoperative care in the Same Day Surgery department.  If you are being discharged the day of surgery, you will not be allowed to drive home. You will need a responsible individual to drive you home and stay with you for 24 hours after surgery.   If you are taking public transportation, you will need to have a responsible individual with you.  Please call the  Pre-admissions Testing Dept. at [phone] if you have any questions about these instructions.  Surgery Visitation Policy:  Patients having surgery or a procedure may have two visitors.  Children under the age of 45 must have an adult with them who is not the patient.  Inpatient Visitation:    Visiting hours are 7 a.m. to 8 p.m. Up to four visitors are allowed at one time in a patient room. The visitors may rotate out with other people during the day.  One visitor age 5 or older may stay with the patient overnight and must be in the room by 8 p.m.   Merchandiser, Retail to address health-related social needs:  [email or web address]

## 2024-05-14 ENCOUNTER — Telehealth: Payer: Self-pay | Admitting: Cardiology

## 2024-05-14 ENCOUNTER — Telehealth: Payer: Self-pay

## 2024-05-14 NOTE — Telephone Encounter (Signed)
Patient returned call please advise

## 2024-05-14 NOTE — Telephone Encounter (Signed)
"  ° °  Pre-operative Risk Assessment    Patient Name: Anna [name]  DOB: 1965-08-22 MRN: [id number]   Date of last office visit: unknown Date of next office visit: unknown   Request for Surgical Clearance    Procedure:  laser lithnotrlpsy stent exchange  Date of Surgery:  Clearance 05/20/24                                Surgeon:  Dr. Francisca Socks Group or Practice Name:  [facility] Phone number:  [phone] Fax number:  [phone]   Type of Clearance Requested:   - Medical    Type of Anesthesia:  Not Indicated   Additional requests/questions:    SignedBerwyn LELON Sprung   05/14/2024, 3:05 PM   "

## 2024-05-14 NOTE — Telephone Encounter (Signed)
 Left message to call back to schedule a tele pre op appt.  ?

## 2024-05-14 NOTE — Telephone Encounter (Signed)
"  °  Patient Consent for Virtual Visit        Anna [name] has provided verbal consent on 05/14/2024 for a virtual visit (video or telephone).   CONSENT FOR VIRTUAL VISIT FOR:  Anna [name]  By participating in this virtual visit I agree to the following:  I hereby voluntarily request, consent and authorize [facility] and its employed or contracted physicians, physician assistants, nurse practitioners or other licensed health care professionals (the Practitioner), to provide me with telemedicine health care services (the Services) as deemed necessary by the treating Practitioner. I acknowledge and consent to receive the Services by the Practitioner via telemedicine. I understand that the telemedicine visit will involve communicating with the Practitioner through live audiovisual communication technology and the disclosure of certain medical information by electronic transmission. I acknowledge that I have been given the opportunity to request an in-person assessment or other available alternative prior to the telemedicine visit and am voluntarily participating in the telemedicine visit.  I understand that I have the right to withhold or withdraw my consent to the use of telemedicine in the course of my care at any time, without affecting my right to future care or treatment, and that the Practitioner or I may terminate the telemedicine visit at any time. I understand that I have the right to inspect all information obtained and/or recorded in the course of the telemedicine visit and may receive copies of available information for a reasonable fee.  I understand that some of the potential risks of receiving the Services via telemedicine include:  Delay or interruption in medical evaluation due to technological equipment failure or disruption; Information transmitted may not be sufficient (e.g. poor resolution of images) to allow for appropriate medical decision making by the Practitioner;  and/or  In rare instances, security protocols could fail, causing a breach of personal health information.  Furthermore, I acknowledge that it is my responsibility to provide information about my medical history, conditions and care that is complete and accurate to the best of my ability. I acknowledge that Practitioner's advice, recommendations, and/or decision may be based on factors not within their control, such as incomplete or inaccurate data provided by me or distortions of diagnostic images or specimens that may result from electronic transmissions. I understand that the practice of medicine is not an exact science and that Practitioner makes no warranties or guarantees regarding treatment outcomes. I acknowledge that a copy of this consent can be made available to me via my patient portal [facility] MyChart), or I can request a printed copy by calling the office of [facility].    I understand that my insurance will be billed for this visit.   I have read or had this consent read to me. I understand the contents of this consent, which adequately explains the benefits and risks of the Services being provided via telemedicine.  I have been provided ample opportunity to ask questions regarding this consent and the Services and have had my questions answered to my satisfaction. I give my informed consent for the services to be provided through the use of telemedicine in my medical care    "

## 2024-05-14 NOTE — Telephone Encounter (Signed)
"  Pt is returning call  "

## 2024-05-14 NOTE — Telephone Encounter (Signed)
" ° °  Name: Anna [name]  DOB: [birth year]  MRN: [id number]  Primary Cardiologist: Redell Cave, MD   Preoperative team, please contact this patient and set up a phone call appointment for further preoperative risk assessment. Please obtain consent and complete medication review. Thank you for your help.  I confirm that guidance regarding antiplatelet and oral anticoagulation therapy has been completed and, if necessary, noted below. None requested.   I also confirmed the patient resides in the state of [place] . As per [facility] Medical Board telemedicine laws, the patient must reside in the state in which the provider is licensed.   Orvell Careaga D Niamh Rada, NP 05/14/2024, 3:17 PM [facility]    "

## 2024-05-14 NOTE — Telephone Encounter (Signed)
 Pt scheduled for VV on 1/7

## 2024-05-15 ENCOUNTER — Ambulatory Visit: Payer: PRIVATE HEALTH INSURANCE | Attending: Cardiovascular Disease | Admitting: Nurse Practitioner

## 2024-05-15 DIAGNOSIS — Z01810 Encounter for preprocedural cardiovascular examination: Secondary | ICD-10-CM | POA: Diagnosis not present

## 2024-05-15 NOTE — Progress Notes (Signed)
 "   Virtual Visit via Telephone Note   Because of MADDI COLLAR co-morbid illnesses, she is at least at moderate risk for complications without adequate follow up.  This format is felt to be most appropriate for this patient at this time.  Due to technical limitations with video connection (technology), today's appointment will be conducted as an audio only telehealth visit, and Anna [name] verbally agreed to proceed in this manner.   All issues noted in this document were discussed and addressed.  No physical exam could be performed with this format.  Evaluation Performed:  Preoperative cardiovascular risk assessment _____________   Date:  05/15/2024   Patient ID:  Anna [name], DOB 1965-07-11, MRN [id number] Patient Location:  Home Provider location:   Office  Primary Care Provider:  Corwin Antu, FNP Primary Cardiologist:  Redell Cave, MD  Chief Complaint / Patient Profile   59 y.o. y/o female with a h/o nonobstructive CAD, PSVT, hypertension, and hyperlipidemia who is pending laser lithnotrlpsy stent exchange on 05/23/2024 with Dr. Twylla of [facility] and presents today for telephonic preoperative cardiovascular risk assessment.   History of Present Illness    Anna [name] is a 59 y.o. female who presents via audio/video conferencing for a telehealth visit today.  Pt was last seen in cardiology clinic on 09/01/2023 by Bernardino Bring, PA-C.  At that time Anna [name] was doing well. The patient is now pending procedure as outlined above. Since her last visit, she has done well from a cardiac standpoint.   She denies chest pain, palpitations, dyspnea, pnd, orthopnea, n, v, dizziness, syncope, edema, weight gain, or early satiety. All other systems reviewed and are otherwise negative except as noted above.   Past Medical History    Past Medical History:  Diagnosis Date   Allergy    Anemia    Anxiety    Arthritis of low back    Bilateral low back pain with right-sided sciatica     Biliary colic    Coronary artery disease 04/04/2022   a.) cCTA 04/04/2022: Ca2+ 1487 (99th percentile age/sex/race matched control) --> ctFFR suggested no significant stenosis.   Depression    Diastolic dysfunction 05/17/2022   a.) TTE 05/17/2022: EF 60-65%, no RWMAs, GLS -20.9%, PASP 42.2, triv MR, G1DD   Family history of adverse reaction to anesthesia    a.) delayed emergence in second degree relative (maternal niece)   Heart murmur    History of kidney stones    History of tobacco use    Hyperlipidemia    Insomnia    a.) takes trazodone  PRN   Palpitations    Primary hypertension 09/26/2022   PSVT (paroxysmal supraventricular tachycardia)    Seizure disorder (HCC)    very rare and when it happens pt states she loses her vision short term-last episode around 2022   Statin intolerance    Tobacco abuse 07/18/2016   Past Surgical History:  Procedure Laterality Date   CATARACT EXTRACTION, BILATERAL     CHOLECYSTECTOMY N/A 01/13/2023   Procedure: LAPAROSCOPIC CHOLECYSTECTOMY WITH INTRAOPERATIVE CHOLANGIOGRAM;  Surgeon: Kallie Manuelita BROCKS, MD;  Location: [facility];  Service: General;  Laterality: N/A;   COLONOSCOPY  01/10/2012   Procedure: COLONOSCOPY;  Surgeon: Oneil DELENA Budge, MD;  Location: [facility];  Service: Gastroenterology;  Laterality: N/A;   CYST EXCISION Right    hand   CYSTOSCOPY WITH STENT PLACEMENT Left 04/17/2024   Procedure: CYSTOSCOPY, WITH STENT INSERTION;  Surgeon: Francisca Redell BROCKS, MD;  Location: [facility];  Service: Urology;  Laterality: Left;   DILATION AND CURETTAGE OF UTERUS     Ablation   FOOT SURGERY Left    HAND SURGERY     LUMBAR FUSION     TONSILLECTOMY      Allergies  Allergies[1]  Home Medications    Prior to Admission medications  Medication Sig Start Date End Date Taking? Authorizing Provider  acetaminophen  (TYLENOL ) 325 MG tablet Take 2 tablets (650 mg total) by mouth every 6 (six) hours as needed for mild pain (pain score 1-3) or fever  (or Fever >/= 101). 04/19/24   Roann Gouty, MD  aspirin  EC 81 MG tablet Take 1 tablet (81 mg total) by mouth daily. Swallow whole. 04/05/22   Darliss Rogue, MD  cholecalciferol (VITAMIN D3) 25 MCG (1000 UNIT) tablet Take 2,000 Units by mouth daily.    [provider]  ezetimibe  (ZETIA ) 10 MG tablet TAKE ONE TABLET BY MOUTH ONE TIME DAILY 03/29/24   Darliss Rogue, MD  Fe Bisgly-Vit C-Vit B12-FA (GENTLE IRON PO) Take 1 tablet by mouth daily.    [provider]  fenofibrate  160 MG tablet Take 1 tablet (160 mg total) by mouth every evening. 06/08/23   Dugal, Tabitha, FNP  loratadine  (CLARITIN ) 10 MG tablet Take 10 mg by mouth daily as needed for allergies.    [provider]  losartan  (COZAAR ) 25 MG tablet TAKE ONE TABLET BY MOUTH EVERY MORNING Patient taking differently: Take 25 mg by mouth every evening. 02/02/24   Corwin Antu, FNP  Omega-3 Fatty Acids (FISH OIL PO) Take 1 capsule by mouth daily.    [provider]  omeprazole  (PRILOSEC) 20 MG capsule Take 1 capsule (20 mg total) by mouth daily. 02/19/24   Dugal, Tabitha, FNP  ondansetron  (ZOFRAN ) 4 MG tablet Take 1 tablet (4 mg total) by mouth every 8 (eight) hours as needed for nausea or vomiting. 06/08/23   Corwin Antu, FNP  oxybutynin  (DITROPAN ) 5 MG tablet Take 1 tablet (5 mg total) by mouth every 8 (eight) hours as needed for bladder spasms (stent discomfort). 04/19/24   Roann Gouty, MD  traZODone  (DESYREL ) 100 MG tablet TAKE 1 TABLET BY MOUTH EVERYDAY AT BEDTIME 08/28/23   Corwin Antu, FNP  Turmeric (QC TUMERIC COMPLEX PO) Take 1 tablet by mouth daily.    [provider]    Physical Exam    Vital Signs:  Anna [name] does not have vital signs available for review today.  Given telephonic nature of communication, physical exam is limited. AAOx3. NAD. Normal affect.  Speech and respirations are unlabored.  Accessory Clinical Findings    None  Assessment & Plan    1.   Preoperative Cardiovascular Risk Assessment:  According to the Revised Cardiac Risk Index (RCRI), her Perioperative Risk of Major Cardiac Event is (%): 0.4. Her Functional Capacity in METs is: 7.01 according to the Duke Activity Status Index (DASI).Therefore, based on ACC/AHA guidelines, patient would be at acceptable risk for the planned procedure without further cardiovascular testing.   The patient was advised that if she develops new symptoms prior to surgery to contact our office to arrange for a follow-up visit, and she verbalized understanding.  Regarding ASA therapy, we recommend continuation of ASA throughout the perioperative period.  However, if the surgeon feels that cessation of ASA is required in the perioperative period, it may be stopped 5-7 days prior to surgery with a plan to resume it as soon as felt to be  feasible from a surgical standpoint in the post-operative period.  A copy of this note will be routed to requesting surgeon.  Time:   Today, I have spent 7 minutes with the patient with telehealth technology discussing medical history, symptoms, and management plan.     Damien JAYSON Braver, NP  05/15/2024, 1:11 PM     [1]  Allergies Allergen Reactions   Macrobid  [Nitrofurantoin ] Other (See Comments)    dizziness   Bactrim  [Sulfamethoxazole -Trimethoprim ] Nausea Only   Buspirone Other (See Comments)    Brain 'buzzes'   Lexapro  [Escitalopram ] Other (See Comments)    Decreased sexual libido   Penicillins Other (See Comments)    Has patient had a PCN reaction causing immediate rash, facial/tongue/throat swelling, SOB or lightheadedness with hypotension: unknown Has patient had a PCN reaction causing severe rash involving mucus membranes or skin necrosis: unknown Has patient had a PCN reaction that required hospitalization: No Has patient had a PCN reaction occurring within the last 10 years: No If all of the above answers are NO, then may proceed with Cephalosporin use.     Statins Other (See Comments)    Body aches   Wellbutrin [Bupropion] Itching   "

## 2024-05-16 ENCOUNTER — Encounter
Admission: RE | Admit: 2024-05-16 | Discharge: 2024-05-16 | Disposition: A | Discharge status: Home / Self Care | Payer: PRIVATE HEALTH INSURANCE | Source: Ambulatory Visit | Attending: Urology | Admitting: Urology

## 2024-05-16 DIAGNOSIS — Z01818 Encounter for other preprocedural examination: Secondary | ICD-10-CM | POA: Insufficient documentation

## 2024-05-16 DIAGNOSIS — I10 Essential (primary) hypertension: Secondary | ICD-10-CM | POA: Diagnosis not present

## 2024-05-16 DIAGNOSIS — I444 Left anterior fascicular block: Secondary | ICD-10-CM | POA: Diagnosis not present

## 2024-05-16 DIAGNOSIS — Z01810 Encounter for preprocedural cardiovascular examination: Secondary | ICD-10-CM | POA: Diagnosis not present

## 2024-05-16 LAB — CBC
HCT: 30.3 % — ABNORMAL LOW (ref 36.0–46.0)
Hemoglobin: 10 g/dL — ABNORMAL LOW (ref 12.0–15.0)
MCH: 29.4 pg (ref 26.0–34.0)
MCHC: 33 g/dL (ref 30.0–36.0)
MCV: 89.1 fL (ref 80.0–100.0)
Platelets: 293 K/uL (ref 150–400)
RBC: 3.4 MIL/uL — ABNORMAL LOW (ref 3.87–5.11)
RDW: 13.6 % (ref 11.5–15.5)
WBC: 5.2 K/uL (ref 4.0–10.5)
nRBC: 0 % (ref 0.0–0.2)

## 2024-05-16 LAB — BASIC METABOLIC PANEL WITH GFR
Anion gap: 10 (ref 5–15)
BUN: 24 mg/dL — ABNORMAL HIGH (ref 6–20)
CO2: 26 mmol/L (ref 22–32)
Calcium: 10.1 mg/dL (ref 8.9–10.3)
Chloride: 105 mmol/L (ref 98–111)
Creatinine, Ser: 0.71 mg/dL (ref 0.44–1.00)
GFR, Estimated: >60 mL/min
Glucose, Bld: 88 mg/dL (ref 70–99)
Potassium: 4.1 mmol/L (ref 3.5–5.1)
Sodium: 140 mmol/L (ref 135–145)

## 2024-05-23 ENCOUNTER — Ambulatory Visit
Admission: RE | Admit: 2024-05-23 | Discharge: 2024-05-23 | Disposition: A | Discharge status: Home / Self Care | Payer: PRIVATE HEALTH INSURANCE | Attending: Urology | Admitting: Urology

## 2024-05-23 ENCOUNTER — Ambulatory Visit: Payer: PRIVATE HEALTH INSURANCE | Admitting: Certified Registered"

## 2024-05-23 ENCOUNTER — Encounter: Payer: Self-pay | Admitting: Urology

## 2024-05-23 ENCOUNTER — Other Ambulatory Visit: Payer: Self-pay

## 2024-05-23 ENCOUNTER — Encounter
Admission: RE | Disposition: A | Discharge status: Home / Self Care | Payer: Self-pay | Source: Home / Self Care | Attending: Urology

## 2024-05-23 ENCOUNTER — Ambulatory Visit: Payer: PRIVATE HEALTH INSURANCE

## 2024-05-23 DIAGNOSIS — N200 Calculus of kidney: Secondary | ICD-10-CM | POA: Diagnosis not present

## 2024-05-23 DIAGNOSIS — G40909 Epilepsy, unspecified, not intractable, without status epilepticus: Secondary | ICD-10-CM | POA: Diagnosis not present

## 2024-05-23 DIAGNOSIS — N201 Calculus of ureter: Secondary | ICD-10-CM | POA: Diagnosis present

## 2024-05-23 DIAGNOSIS — F17210 Nicotine dependence, cigarettes, uncomplicated: Secondary | ICD-10-CM | POA: Diagnosis not present

## 2024-05-23 DIAGNOSIS — I2510 Atherosclerotic heart disease of native coronary artery without angina pectoris: Secondary | ICD-10-CM | POA: Diagnosis not present

## 2024-05-23 DIAGNOSIS — I10 Essential (primary) hypertension: Secondary | ICD-10-CM | POA: Diagnosis not present

## 2024-05-23 HISTORY — PX: CYSTOSCOPY/URETEROSCOPY/HOLMIUM LASER/STENT PLACEMENT: SHX6546

## 2024-05-23 MED ORDER — FENTANYL CITRATE (PF) 100 MCG/2ML IJ SOLN
INTRAMUSCULAR | Status: AC
  Filled 2024-05-23: qty 2

## 2024-05-23 MED ORDER — HYDROCODONE-ACETAMINOPHEN 5-325 MG PO TABS: 1.0000 | ORAL_TABLET | Freq: Four times a day (QID) | ORAL | 0 refills | Status: AC | PRN

## 2024-05-23 MED ORDER — PHENYLEPHRINE 80 MCG/ML (10ML) SYRINGE FOR IV PUSH (FOR BLOOD PRESSURE SUPPORT)
PREFILLED_SYRINGE | INTRAVENOUS | Status: DC | PRN
  Administered 2024-05-23 (×2): 80 ug via INTRAVENOUS
  Administered 2024-05-23: 160 ug via INTRAVENOUS

## 2024-05-23 MED ORDER — IOHEXOL 180 MG/ML  SOLN
INTRAMUSCULAR | Status: DC | PRN
  Administered 2024-05-23: 10 mL

## 2024-05-23 MED ORDER — ORAL CARE MOUTH RINSE: 15.0000 mL | Freq: Once | OROMUCOSAL | Status: AC

## 2024-05-23 MED ORDER — PROPOFOL 10 MG/ML IV BOLUS
INTRAVENOUS | Status: DC | PRN
  Administered 2024-05-23: 160 mg via INTRAVENOUS

## 2024-05-23 MED ORDER — SODIUM CHLORIDE 0.9 % IR SOLN
Status: DC | PRN
  Administered 2024-05-23: 3000 mL

## 2024-05-23 MED ORDER — DEXAMETHASONE SOD PHOSPHATE PF 10 MG/ML IJ SOLN
INTRAMUSCULAR | Status: DC | PRN
  Administered 2024-05-23: 10 mg via INTRAVENOUS

## 2024-05-23 MED ORDER — ROCURONIUM BROMIDE 100 MG/10ML IV SOLN
INTRAVENOUS | Status: DC | PRN
  Administered 2024-05-23: 50 mg via INTRAVENOUS

## 2024-05-23 MED ORDER — SODIUM CHLORIDE 0.9 % IV SOLN
1.0000 g | INTRAVENOUS | Status: AC
  Administered 2024-05-23: 1 g via INTRAVENOUS
  Filled 2024-05-23: qty 1
  Filled 2024-05-23: qty 10

## 2024-05-23 MED ORDER — FENTANYL CITRATE (PF) 100 MCG/2ML IJ SOLN
INTRAMUSCULAR | Status: DC | PRN
  Administered 2024-05-23 (×2): 50 ug via INTRAVENOUS

## 2024-05-23 MED ORDER — MIDAZOLAM HCL 2 MG/2ML IJ SOLN
INTRAMUSCULAR | Status: AC
  Filled 2024-05-23: qty 2

## 2024-05-23 MED ORDER — CHLORHEXIDINE GLUCONATE 0.12 % MT SOLN
15.0000 mL | Freq: Once | OROMUCOSAL | Status: AC
  Administered 2024-05-23: 15 mL via OROMUCOSAL

## 2024-05-23 MED ORDER — SUGAMMADEX SODIUM 200 MG/2ML IV SOLN
INTRAVENOUS | Status: DC | PRN
  Administered 2024-05-23: 200 mg via INTRAVENOUS

## 2024-05-23 MED ORDER — LACTATED RINGERS IV SOLN: INTRAVENOUS | Status: DC

## 2024-05-23 MED ORDER — MIDAZOLAM HCL (PF) 2 MG/2ML IJ SOLN
INTRAMUSCULAR | Status: DC | PRN
  Administered 2024-05-23: 2 mg via INTRAVENOUS

## 2024-05-23 MED ORDER — STERILE WATER FOR IRRIGATION IR SOLN
Status: DC | PRN
  Administered 2024-05-23: 500 mL

## 2024-05-23 MED ORDER — OXYBUTYNIN CHLORIDE 5 MG PO TABS: 5.0000 mg | ORAL_TABLET | Freq: Three times a day (TID) | ORAL | 0 refills | Status: AC | PRN

## 2024-05-23 MED ORDER — DEXMEDETOMIDINE HCL IN NACL 80 MCG/20ML IV SOLN
INTRAVENOUS | Status: DC | PRN
  Administered 2024-05-23: 8 ug via INTRAVENOUS

## 2024-05-23 MED ORDER — ONDANSETRON HCL 4 MG/2ML IJ SOLN
INTRAMUSCULAR | Status: DC | PRN
  Administered 2024-05-23: 4 mg via INTRAVENOUS

## 2024-05-23 MED ORDER — CHLORHEXIDINE GLUCONATE 0.12 % MT SOLN
OROMUCOSAL | Status: AC
  Filled 2024-05-23: qty 15

## 2024-05-23 NOTE — Interval H&P Note (Signed)
 History and Physical Interval Note:  05/23/2024 8:13 AM  Anna [name]  has presented today for surgery, with the diagnosis of Left Ureteral Stone.  The various methods of treatment have been discussed with the patient and family. After consideration of risks, benefits and other options for treatment, the patient has consented to  Procedures with comments: CYSTOSCOPY/URETEROSCOPY/HOLMIUM LASER/STENT PLACEMENT (Left) - EXCHANGE as a surgical intervention.  The patient's history has been reviewed, patient examined, no change in status, stable for surgery.  I have reviewed the patient's chart and labs.  Questions were answered to the patient's satisfaction.    CV: RRR Lungs: Clear  Anna [name] C Nefi Musich

## 2024-05-23 NOTE — Transfer of Care (Signed)
 Immediate Anesthesia Transfer of Care Note  Patient: Anna [name]  Procedure(s) Performed: CYSTOSCOPY/URETEROSCOPY/BASKET EXTRACTION/STENT PLACEMENT (Left: Ureter)  Patient Location: PACU  Anesthesia Type:General  Level of Consciousness: drowsy  Airway & Oxygen Therapy: Patient Spontanous Breathing and Patient connected to face mask oxygen  Post-op Assessment: Report given to RN  Post vital signs: stable  Last Vitals:  Vitals Value Taken Time  BP 113/96 05/23/24 09:08  Temp    Pulse 85 05/23/24 09:11  Resp 10 05/23/24 09:11  SpO2 98 % 05/23/24 09:11  Vitals shown include unfiled device data.  Last Pain:  Vitals:   05/23/24 0744  TempSrc: Temporal  PainSc: 0-No pain         Complications: No notable events documented.

## 2024-05-23 NOTE — H&P (Signed)
 "  Urology H&P    Assessment/Recommendations:  1.  Left distal ureteral calculus Presents for definitive stone treatment with ureteroscopy/stone removal.  We discussed that is possible she may have passed the stones due to size and location.  She does have a nonobstructing 3 mm left renal calculus and is interested in having this treated also.  We discussed renal calculi on occasions can be intraparenchymal and not treated.  The procedure was discussed in detail including potential risks of bleeding, infection and injury to the ureter.  We discussed possible need for replacement of her ureteral stent postop.  All questions were answered   History of Present Illness: Anna [name] is a 59 y.o. female status post cystoscopy with left ureteral stent placement 04/17/2024 by Dr. Francisca for a 3 mm left distal ureteral calculus and UA concerning for infection.  She presents today for definitive stone treatment.  She is not aware of passing a stone.  Past Medical History:  Diagnosis Date   Allergy    Anemia    Anxiety    Arthritis of low back    Bilateral low back pain with right-sided sciatica    Biliary colic    Coronary artery disease 04/04/2022   a.) cCTA 04/04/2022: Ca2+ 1487 (99th percentile age/sex/race matched control) --> ctFFR suggested no significant stenosis.   Depression    Diastolic dysfunction 05/17/2022   a.) TTE 05/17/2022: EF 60-65%, no RWMAs, GLS -20.9%, PASP 42.2, triv MR, G1DD   Family history of adverse reaction to anesthesia    a.) delayed emergence in second degree relative (maternal niece)   Heart murmur    History of kidney stones    History of tobacco use    Hyperlipidemia    Insomnia    a.) takes trazodone  PRN   Palpitations    Primary hypertension 09/26/2022   PSVT (paroxysmal supraventricular tachycardia)    Seizure disorder (HCC)    very rare and when it happens pt states she loses her vision short term-last episode around 2022   Statin intolerance     Tobacco abuse 07/18/2016    Past Surgical History:  Procedure Laterality Date   CATARACT EXTRACTION, BILATERAL     CHOLECYSTECTOMY N/A 01/13/2023   Procedure: LAPAROSCOPIC CHOLECYSTECTOMY WITH INTRAOPERATIVE CHOLANGIOGRAM;  Surgeon: Kallie Manuelita BROCKS, MD;  Location: [facility];  Service: General;  Laterality: N/A;   COLONOSCOPY  01/10/2012   Procedure: COLONOSCOPY;  Surgeon: Oneil DELENA Budge, MD;  Location: [facility];  Service: Gastroenterology;  Laterality: N/A;   CYST EXCISION Right    hand   CYSTOSCOPY WITH STENT PLACEMENT Left 04/17/2024   Procedure: CYSTOSCOPY, WITH STENT INSERTION;  Surgeon: Francisca Redell BROCKS, MD;  Location: [facility];  Service: Urology;  Laterality: Left;   DILATION AND CURETTAGE OF UTERUS     Ablation   FOOT SURGERY Left    HAND SURGERY     LUMBAR FUSION     TONSILLECTOMY      Home Medications:  Active Medications[1]  Allergies: Allergies[2]  Family History  Problem Relation Age of Onset   Hypertension Mother    Diabetes Mother    Heart attack Mother 54   Heart disease Mother    Kidney disease Mother    Hyperlipidemia Mother    Non-Hodgkin's lymphoma Father        t cell   Arthritis Father    Hyperlipidemia Father    Thyroid  disease Daughter    Breast cancer Cousin  in 24's    Social History:  reports that she has been smoking cigarettes. She started smoking about 40 years ago. She has a 20 pack-year smoking history. She has never used smokeless tobacco. She reports that she does not currently use alcohol. She reports that she does not use drugs.  ROS: No fever, chills, chest pain, shortness of breath  Physical Exam:  Vital signs in last 24 hours: Temp:  [97.6 F (36.4 C)] 97.6 F (36.4 C) (01/15 0744) Pulse Rate:  [71] 71 (01/15 0744) Resp:  [18] 18 (01/15 0744) BP: (120)/(84) 120/84 (01/15 0744) SpO2:  [100 %] 100 % (01/15 0744) Constitutional:  Alert and oriented, No acute distress HEENT: [place] ATclubbing, cyanosis, or  edema. Respiratory: Normal respiratory effort Psychiatric: Normal mood and affect    05/23/2024, 8:09 AM  Glendia Barba,  MD        [1]  Current Meds  Medication Sig   acetaminophen  (TYLENOL ) 325 MG tablet Take 2 tablets (650 mg total) by mouth every 6 (six) hours as needed for mild pain (pain score 1-3) or fever (or Fever >/= 101).   aspirin  EC 81 MG tablet Take 1 tablet (81 mg total) by mouth daily. Swallow whole.   ezetimibe  (ZETIA ) 10 MG tablet TAKE ONE TABLET BY MOUTH ONE TIME DAILY   Fe Bisgly-Vit C-Vit B12-FA (GENTLE IRON PO) Take 1 tablet by mouth daily.   fenofibrate  160 MG tablet Take 1 tablet (160 mg total) by mouth every evening.   loratadine  (CLARITIN ) 10 MG tablet Take 10 mg by mouth daily as needed for allergies.   losartan  (COZAAR ) 25 MG tablet TAKE ONE TABLET BY MOUTH EVERY MORNING (Patient taking differently: Take 25 mg by mouth every evening.)   omeprazole  (PRILOSEC) 20 MG capsule Take 1 capsule (20 mg total) by mouth daily.   traZODone  (DESYREL ) 100 MG tablet TAKE 1 TABLET BY MOUTH EVERYDAY AT BEDTIME  [2]  Allergies Allergen Reactions   Macrobid  [Nitrofurantoin ] Other (See Comments)    dizziness   Bactrim  [Sulfamethoxazole -Trimethoprim ] Nausea Only   Buspirone Other (See Comments)    Brain 'buzzes'   Lexapro  [Escitalopram ] Other (See Comments)    Decreased sexual libido   Penicillins Other (See Comments)    Has patient had a PCN reaction causing immediate rash, facial/tongue/throat swelling, SOB or lightheadedness with hypotension: unknown Has patient had a PCN reaction causing severe rash involving mucus membranes or skin necrosis: unknown Has patient had a PCN reaction that required hospitalization: No Has patient had a PCN reaction occurring within the last 10 years: No If all of the above answers are NO, then may proceed with Cephalosporin use.    Statins Other (See Comments)    Body aches   Wellbutrin [Bupropion] Itching   "

## 2024-05-23 NOTE — Op Note (Signed)
" ° °  Preoperative diagnosis:  Left ureteral calculus Left nephrolithiasis  Postoperative diagnosis:  Passed ureteral calculus Left nephrolithiasis  Procedure:  Cystoscopy Left ureteroscopy and stone removal Left ureteral stent exchange (47F/22 cm) Left retrograde pyelography with interpretation  Surgeon: Glendia C. Jamieon Lannen, M.D.  Anesthesia: General  Complications: None  Intraoperative findings:  Cystoscopy: Bladder mucosa without solid or papillary lesions; UOs normal-appearing bilaterally.  Mild inflammatory changes left hemitrigone secondary to indwelling stent Ureteroscopy: 3 mm stone not present in the ureter.  2 calculi left mid calyx measuring ~2 mm and 3 mm basketed and removed Left retrograde pyelography post procedure showed no filling defects or contrast extravasation  EBL: Minimal  Specimens: Left renal calculi for stone analysis   Indication: Anna [name] is a 59 y.o.  female s/p placement of a left ureteral stent for a 3 mm obstructing distal calculus with suspected infection by Dr. Francisca on 04/17/2024.  She presents today for definitive stone treatment.  After reviewing the management options for treatment, the patient elected to proceed with the above surgical procedure(s). We have discussed the potential benefits and risks of the procedure, side effects of the proposed treatment, the likelihood of the patient achieving the goals of the procedure, and any potential problems that might occur during the procedure or recuperation. Informed consent has been obtained.  Description of procedure:  The patient was taken to the operating room and general anesthesia was induced.  The patient was placed in the dorsal lithotomy position, prepped and draped in the usual sterile fashion, and preoperative antibiotics were administered. A preoperative time-out was performed.   A 21 French cystoscope was lubricated and placed per urethra.  Panendoscopy was performed with findings  as described above.  The stent showed no significant encrustation and was grasped with endoscopic forceps and brought out through the urethral meatus.  A 0.038 Sensor wire was then placed through the stent advanced into the renal pelvis under fluoroscopic guidance.  The stent was removed and a 4.5 Fr semirigid ureteroscope was then advanced into the ureter next to the guidewire.  The ureteroscope was advanced just distal to the UPJ and no calculus was identified. The semirigid ureteroscope was removed and a single channel digital flexible ureteroscope was placed per urethra and easily advanced into the left UO alongside the guidewire.  The ureteroscope was advanced into the renal pelvis under direct vision.  Pyeloscopy was performed with findings as described above.  The 2 mid calyceal calculi were placed in a 1.9 F0 tip nitinol basket and removed without difficulty.  The ureteroscope was re-advanced.  Retrograde pyelogram was performed through the ureteroscope with findings as described above.  All calyces were examined and no additional calculi were seen.  The ureteroscope was removed.  A 6 F/22 cm Contour ureteral stent with tether was placed under fluoroscopic guidance.  The wire was then removed with an adequate stent curl noted in the upper pole calyx as well as in the bladder.  The bladder was then emptied and the procedure ended.  The patient appeared to tolerate the procedure well and without complications.  After anesthetic reversal the patient was transported to the PACU in stable condition.   Plan: She was instructed to remove her stent on Saturday at 05/25/2024 Postop follow-up will be scheduled in ~1 month   Glendia Barba, MD  "

## 2024-05-23 NOTE — Discharge Instructions (Signed)
 DISCHARGE INSTRUCTIONS FOR KIDNEY STONE/URETERAL STENT   MEDICATIONS:  1. Resume all your other meds from home.  2.  AZO (over-the-counter) can help with the burning/stinging when you urinate. 3.  Hydrocodone  is for moderate/severe pain, Rx was sent to your pharmacy. 4.  Oxybutynin  which will help with bladder/stent irritation was refilled  ACTIVITY:  1. May resume regular activities in 24 hours. 2. No driving while on narcotic pain medications  3. Drink plenty of water   4. Continue to walk at home - you can still get blood clots when you are at home, so keep active, but don't over do it.  5. May return to work/school tomorrow or when you feel ready   BATHING:  1. You can shower. 2. You have a string coming from your urethra: The stent string is attached to your ureteral stent. Do not pull on this.   SIGNS/SYMPTOMS TO CALL:  Common postoperative symptoms include urinary frequency, urgency, bladder spasm and blood in the urine  Please call us  if you have a fever greater than 101.5, uncontrolled nausea/vomiting, uncontrolled pain, dizziness, unable to urinate, excessively bloody urine, chest pain, shortness of breath, leg swelling, leg pain, or any other concerns or questions.   You can reach us  at [phone].   FOLLOW-UP:  1. You will be contacted for a follow-up appointment in ~1 month 2. You have a string attached to your stent, you may remove it on Saturday, 05/25/2024. To do this, pull the string until the stent is completely removed. You may feel an odd sensation in your back.

## 2024-05-23 NOTE — Anesthesia Procedure Notes (Signed)
 Procedure Name: Intubation Date/Time: 05/23/2024 8:26 AM  Performed by: Rosine Shona Jansky, CRNAPre-anesthesia Checklist: Patient identified, Emergency Drugs available, Suction available and Patient being monitored Patient Re-evaluated:Patient Re-evaluated prior to induction Oxygen Delivery Method: Circle system utilized Preoxygenation: Pre-oxygenation with 100% oxygen Induction Type: IV induction Ventilation: Mask ventilation without difficulty Laryngoscope Size: 3 Grade View: Grade I Tube type: Oral Tube size: 7.0 mm Number of attempts: 1 Airway Equipment and Method: Stylet and Oral airway Placement Confirmation: ETT inserted through vocal cords under direct vision, positive ETCO2 and breath sounds checked- equal and bilateral Secured at: 21 cm Tube secured with: Tape Dental Injury: Teeth and Oropharynx as per pre-operative assessment

## 2024-05-24 ENCOUNTER — Encounter: Payer: Self-pay | Admitting: Urology

## 2024-05-27 NOTE — Anesthesia Postprocedure Evaluation (Signed)
"   Anesthesia Post Note  Patient: Anna [name]  Procedure(s) Performed: CYSTOSCOPY/URETEROSCOPY/BASKET EXTRACTION/STENT PLACEMENT (Left: Ureter)  Patient location during evaluation: PACU Anesthesia Type: General Level of consciousness: awake and alert Pain management: pain level controlled Vital Signs Assessment: post-procedure vital signs reviewed and stable Respiratory status: spontaneous breathing, nonlabored ventilation, respiratory function stable and patient connected to nasal cannula oxygen Cardiovascular status: blood pressure returned to baseline and stable Postop Assessment: no apparent nausea or vomiting Anesthetic complications: no   No notable events documented.   Last Vitals:  Vitals:   05/23/24 0930 05/23/24 0956  BP: 117/77 125/81  Pulse: 74 70  Resp: 11 16  Temp:  36.7 C  SpO2: 100% 99%    Last Pain:  Vitals:   05/23/24 0956  TempSrc: Temporal  PainSc: 0-No pain                 Lynwood KANDICE Clause      "

## 2024-05-27 NOTE — Anesthesia Preprocedure Evaluation (Signed)
 "                                  Anesthesia Evaluation  Patient identified by MRN, date of birth, ID band Patient awake    Reviewed: Allergy & Precautions, H&P , NPO status , Patient's Chart, lab work & pertinent test results, reviewed documented beta blocker date and time   Airway Mallampati: II   Neck ROM: full    Dental  (+) Poor Dentition   Pulmonary neg pulmonary ROS, Current Smoker and Patient abstained from smoking.   Pulmonary exam normal        Cardiovascular hypertension, + CAD  Normal cardiovascular exam+ Valvular Problems/Murmurs  Rhythm:regular Rate:Normal     Neuro/Psych Seizures -,   Anxiety Depression     Neuromuscular disease  negative psych ROS   GI/Hepatic negative GI ROS, Neg liver ROS,,,  Endo/Other  negative endocrine ROS    Renal/GU negative Renal ROS  negative genitourinary   Musculoskeletal   Abdominal   Peds  Hematology  (+) Blood dyscrasia, anemia   Anesthesia Other Findings Past Medical History: No date: Allergy No date: Anemia No date: Anxiety No date: Arthritis of low back No date: Bilateral low back pain with right-sided sciatica No date: Biliary colic 04/04/2022: Coronary artery disease     Comment:  a.) cCTA 04/04/2022: Ca2+ 1487 (99th percentile               age/sex/race matched control) --> ctFFR suggested no               significant stenosis. No date: Depression 05/17/2022: Diastolic dysfunction     Comment:  a.) TTE 05/17/2022: EF 60-65%, no RWMAs, GLS -20.9%,               PASP 42.2, triv MR, G1DD No date: Family history of adverse reaction to anesthesia     Comment:  a.) delayed emergence in second degree relative               (maternal niece) No date: Heart murmur No date: History of kidney stones No date: History of tobacco use No date: Hyperlipidemia No date: Insomnia     Comment:  a.) takes trazodone  PRN No date: Palpitations 09/26/2022: Primary hypertension No date: PSVT (paroxysmal  supraventricular tachycardia) No date: Seizure disorder [facility])     Comment:  very rare and when it happens pt states she loses her               vision short term-last episode around 2022 No date: Statin intolerance 07/18/2016: Tobacco abuse Past Surgical History: No date: CATARACT EXTRACTION, BILATERAL 01/13/2023: CHOLECYSTECTOMY; N/A     Comment:  Procedure: LAPAROSCOPIC CHOLECYSTECTOMY WITH               INTRAOPERATIVE CHOLANGIOGRAM;  Surgeon: Kallie Manuelita BROCKS, MD;  Location: [facility];  Service: General;  Laterality:              N/A; 01/10/2012: COLONOSCOPY     Comment:  Procedure: COLONOSCOPY;  Surgeon: Oneil DELENA Budge, MD;                Location: [facility];  Service: Gastroenterology;                Laterality: N/A; No date: CYST EXCISION; Right     Comment:  hand 04/17/2024: CYSTOSCOPY WITH STENT PLACEMENT; Left     Comment:  Procedure: CYSTOSCOPY, WITH STENT INSERTION;  Surgeon:               Francisca Redell BROCKS, MD;  Location: [facility];  Service:               Urology;  Laterality: Left; 05/23/2024: CYSTOSCOPY/URETEROSCOPY/HOLMIUM LASER/STENT PLACEMENT; Left     Comment:  Procedure: CYSTOSCOPY/URETEROSCOPY/BASKET               EXTRACTION/STENT PLACEMENT;  Surgeon: Twylla Glendia BROCKS,               MD;  Location: [facility];  Service: Urology;  Laterality:               Left;  EXCHANGE No date: DILATION AND CURETTAGE OF UTERUS     Comment:  Ablation No date: FOOT SURGERY; Left No date: HAND SURGERY No date: LUMBAR FUSION No date: TONSILLECTOMY   Reproductive/Obstetrics negative OB ROS                              Anesthesia Physical Anesthesia Plan  ASA: 3  Anesthesia Plan: General   Post-op Pain Management:    Induction:   PONV Risk Score and Plan:   Airway Management Planned:   Additional Equipment:   Intra-op Plan:   Post-operative Plan:   Informed Consent: I have reviewed the patients History and Physical, chart,  labs and discussed the procedure including the risks, benefits and alternatives for the proposed anesthesia with the patient or authorized representative who has indicated his/her understanding and acceptance.     Dental Advisory Given  Plan Discussed with: CRNA  Anesthesia Plan Comments:         Anesthesia Quick Evaluation  "

## 2024-06-02 LAB — STONE ANALYSIS
Calcium Oxalate Dihydrate: 60 %
Calcium Oxalate Monohydrate: 30 %
Calcium Phosphate (Hydroxyl): 10 %
Weight Calculi: 2 mg

## 2024-07-02 ENCOUNTER — Ambulatory Visit: Payer: PRIVATE HEALTH INSURANCE | Admitting: Urology
